# Patient Record
Sex: Female | Born: 1940 | Race: Black or African American | Hispanic: No | Smoking: Never smoker
Health system: Southern US, Community
[De-identification: ages and names within clinical notes are randomized; demographics above are authoritative.]

## PROBLEM LIST (undated history)

## (undated) DIAGNOSIS — E119 Type 2 diabetes mellitus without complications: Secondary | ICD-10-CM

## (undated) DIAGNOSIS — I10 Essential (primary) hypertension: Secondary | ICD-10-CM

## (undated) DIAGNOSIS — E1110 Type 2 diabetes mellitus with ketoacidosis without coma: Secondary | ICD-10-CM

## (undated) DIAGNOSIS — N179 Acute kidney failure, unspecified: Secondary | ICD-10-CM

## (undated) DIAGNOSIS — C259 Malignant neoplasm of pancreas, unspecified: Secondary | ICD-10-CM

## (undated) HISTORY — DX: Essential (primary) hypertension: I10

## (undated) HISTORY — PX: DENTAL SURGERY: SHX609

## (undated) HISTORY — DX: Type 2 diabetes mellitus with ketoacidosis without coma: E11.10

## (undated) HISTORY — DX: Acute kidney failure, unspecified: N17.9

## (undated) HISTORY — PX: TUBAL LIGATION: SHX77

---

## 2010-04-05 ENCOUNTER — Emergency Department (HOSPITAL_COMMUNITY): Admission: EM | Admit: 2010-04-05 | Discharge: 2010-04-05 | Payer: Self-pay | Admitting: Family Medicine

## 2011-02-24 LAB — BASIC METABOLIC PANEL
BUN: 8 mg/dL (ref 6–23)
Calcium: 9.2 mg/dL (ref 8.4–10.5)
GFR calc non Af Amer: >60 mL/min (ref >60)
Glucose, Bld: 109 mg/dL — ABNORMAL HIGH (ref 70–99)
Sodium: 136 mEq/L (ref 135–145)

## 2011-02-24 LAB — CBC
Hemoglobin: 14 g/dL (ref 12.0–15.0)
Platelets: 226 10*3/uL (ref 150–400)
RDW: 13.6 % (ref 11.5–15.5)

## 2013-12-07 ENCOUNTER — Emergency Department (HOSPITAL_COMMUNITY)
Admission: EM | Admit: 2013-12-07 | Discharge: 2013-12-07 | Disposition: A | Discharge status: Other Acute Inpatient Hospital | Payer: Medicare HMO | Source: Home / Self Care | Attending: Emergency Medicine | Admitting: Emergency Medicine

## 2013-12-07 ENCOUNTER — Inpatient Hospital Stay (HOSPITAL_COMMUNITY)
Admission: EM | Admit: 2013-12-07 | Discharge: 2013-12-12 | DRG: 638 | Disposition: A | Discharge status: Home Health | Payer: Medicare HMO | Attending: Internal Medicine | Admitting: Internal Medicine

## 2013-12-07 ENCOUNTER — Encounter (HOSPITAL_COMMUNITY): Payer: Self-pay | Admitting: Emergency Medicine

## 2013-12-07 DIAGNOSIS — E860 Dehydration: Secondary | ICD-10-CM | POA: Diagnosis present

## 2013-12-07 DIAGNOSIS — E870 Hyperosmolality and hypernatremia: Secondary | ICD-10-CM | POA: Diagnosis present

## 2013-12-07 DIAGNOSIS — E876 Hypokalemia: Secondary | ICD-10-CM | POA: Diagnosis not present

## 2013-12-07 DIAGNOSIS — E1010 Type 1 diabetes mellitus with ketoacidosis without coma: Principal | ICD-10-CM | POA: Diagnosis present

## 2013-12-07 DIAGNOSIS — N179 Acute kidney failure, unspecified: Secondary | ICD-10-CM | POA: Diagnosis present

## 2013-12-07 DIAGNOSIS — E1110 Type 2 diabetes mellitus with ketoacidosis without coma: Secondary | ICD-10-CM

## 2013-12-07 DIAGNOSIS — C259 Malignant neoplasm of pancreas, unspecified: Secondary | ICD-10-CM

## 2013-12-07 DIAGNOSIS — D696 Thrombocytopenia, unspecified: Secondary | ICD-10-CM | POA: Diagnosis present

## 2013-12-07 DIAGNOSIS — I10 Essential (primary) hypertension: Secondary | ICD-10-CM | POA: Diagnosis present

## 2013-12-07 DIAGNOSIS — E875 Hyperkalemia: Secondary | ICD-10-CM | POA: Diagnosis present

## 2013-12-07 HISTORY — DX: Type 2 diabetes mellitus without complications: E11.9

## 2013-12-07 HISTORY — DX: Malignant neoplasm of pancreas, unspecified: C25.9

## 2013-12-07 LAB — CBC WITH DIFFERENTIAL/PLATELET
Basophils Absolute: 0 10*3/uL (ref 0.0–0.1)
Basophils Relative: 0 % (ref 0–1)
EOS ABS: 0 10*3/uL (ref 0.0–0.7)
EOS PCT: 0 % (ref 0–5)
HEMATOCRIT: 41.2 % (ref 36.0–46.0)
HEMOGLOBIN: 13.3 g/dL (ref 12.0–15.0)
LYMPHS ABS: 1 10*3/uL (ref 0.7–4.0)
LYMPHS PCT: 23 % (ref 12–46)
MCH: 28.5 pg (ref 26.0–34.0)
MCHC: 32.3 g/dL (ref 30.0–36.0)
MCV: 88.2 fL (ref 78.0–100.0)
MONO ABS: 0.5 10*3/uL (ref 0.1–1.0)
MONOS PCT: 12 % (ref 3–12)
Neutro Abs: 2.8 10*3/uL (ref 1.7–7.7)
Neutrophils Relative %: 65 % (ref 43–77)
PLATELETS: 141 10*3/uL — AB (ref 150–400)
RBC: 4.67 MIL/uL (ref 3.87–5.11)
RDW: 14.7 % (ref 11.5–15.5)
WBC: 4.3 10*3/uL (ref 4.0–10.5)

## 2013-12-07 LAB — COMPREHENSIVE METABOLIC PANEL
ALT: 11 U/L (ref 0–35)
AST: 13 U/L (ref 0–37)
Albumin: 2.9 g/dL — ABNORMAL LOW (ref 3.5–5.2)
Alkaline Phosphatase: 78 U/L (ref 39–117)
BUN: 37 mg/dL — AB (ref 6–23)
CALCIUM: 9.1 mg/dL (ref 8.4–10.5)
CO2: 14 meq/L — AB (ref 19–32)
CREATININE: 1.34 mg/dL — AB (ref 0.50–1.10)
Chloride: 111 mEq/L (ref 96–112)
GFR, EST AFRICAN AMERICAN: 44 mL/min — AB (ref >90)
GFR, EST NON AFRICAN AMERICAN: 38 mL/min — AB (ref >90)
GLUCOSE: 574 mg/dL — AB (ref 70–99)
Potassium: 5.4 mEq/L — ABNORMAL HIGH (ref 3.7–5.3)
SODIUM: 152 meq/L — AB (ref 137–147)
TOTAL PROTEIN: 7.4 g/dL (ref 6.0–8.3)
Total Bilirubin: <0.2 mg/dL — ABNORMAL LOW (ref 0.3–1.2)

## 2013-12-07 LAB — URINALYSIS, ROUTINE W REFLEX MICROSCOPIC
Glucose, UA: >1000 mg/dL — AB
LEUKOCYTES UA: NEGATIVE
NITRITE: NEGATIVE
PH: 5 (ref 5.0–8.0)
Protein, ur: 30 mg/dL — AB
SPECIFIC GRAVITY, URINE: 1.03 (ref 1.005–1.030)
UROBILINOGEN UA: 0.2 mg/dL (ref 0.0–1.0)

## 2013-12-07 LAB — URINE MICROSCOPIC-ADD ON

## 2013-12-07 LAB — GLUCOSE, CAPILLARY
Glucose-Capillary: 540 mg/dL — ABNORMAL HIGH (ref 70–99)
Glucose-Capillary: 531 mg/dL — ABNORMAL HIGH (ref 70–99)
Glucose-Capillary: 430 mg/dL — ABNORMAL HIGH (ref 70–99)

## 2013-12-07 MED ORDER — SODIUM CHLORIDE 0.9 % IV SOLN
INTRAVENOUS | Status: DC
  Administered 2013-12-07: 3.7 [IU]/h via INTRAVENOUS
  Filled 2013-12-07: qty 1

## 2013-12-07 MED ORDER — HYDRALAZINE HCL 10 MG PO TABS
10.0000 mg | ORAL_TABLET | Freq: Three times a day (TID) | ORAL | Status: DC
  Administered 2013-12-08 – 2013-12-11 (×11): 10 mg via ORAL
  Filled 2013-12-07 (×19): qty 1

## 2013-12-07 MED ORDER — SODIUM CHLORIDE 0.9 % IV BOLUS (SEPSIS)
1000.0000 mL | Freq: Once | INTRAVENOUS | Status: AC
  Administered 2013-12-07: 1000 mL via INTRAVENOUS

## 2013-12-07 MED ORDER — SODIUM CHLORIDE 0.9 % IV BOLUS (SEPSIS): 1000.0000 mL | Freq: Once | INTRAVENOUS | Status: DC

## 2013-12-07 MED ORDER — SODIUM CHLORIDE 0.9 % IV SOLN
INTRAVENOUS | Status: DC
  Administered 2013-12-07: 19:00:00 via INTRAVENOUS

## 2013-12-07 MED ORDER — DEXTROSE-NACL 5-0.45 % IV SOLN
INTRAVENOUS | Status: DC
  Administered 2013-12-08: 05:00:00 via INTRAVENOUS

## 2013-12-07 NOTE — ED Provider Notes (Signed)
Medical screening examination/treatment/procedure(s) were conducted as a shared visit with non-physician practitioner(s) and myself.  I personally evaluated the patient during the encounter.  EKG Interpretation    Date/Time:  Thursday December 07 2013 45:40:98 EST Ventricular Rate:  121 PR Interval:  106 QRS Duration: 72 QT Interval:  320 QTC Calculation: 119 R Axis:   -50 Text Interpretation:  Sinus tachycardia LAD, consider left anterior fascicular block Minimal ST depression, lateral leads No significant change since last tracing Confirmed by YAO  MD, DAVID [phone]) on 12/07/2013 7:38:06 PM            Debra [name] is a 73 y.o. female hasn't been followed up with doctors here with hyperglycemia. Fatigue and urinary frequency and polydipsia for a month. Went to urgent care, was found to be hyperglycemic. Sent for eval. CBG was 574. AG was 27. I think she likely has combination of DKA and hyperosmolar. Given 2 L NS and started on glucostabilizer. Will admit to stepdown.   CRITICAL CARE Performed by: Darl Householder, DAVID   Total critical care time:30 min   Critical care time was exclusive of separately billable procedures and treating other patients.  Critical care was necessary to treat or prevent imminent or life-threatening deterioration.  Critical care was time spent personally by me on the following activities: development of treatment plan with patient and/or surrogate as well as nursing, discussions with consultants, evaluation of patient's response to treatment, examination of patient, obtaining history from patient or surrogate, ordering and performing treatments and interventions, ordering and review of laboratory studies, ordering and review of radiographic studies, pulse oximetry and re-evaluation of patient's condition.    Wandra Arthurs, MD 12/07/13 [phone]

## 2013-12-07 NOTE — ED Notes (Signed)
Reportedly not eating,  can't keep down PO when she does eat, memory issues

## 2013-12-07 NOTE — Discharge Instructions (Signed)
We have determined that your problem requires further evaluation in the emergency department.  We will take care of your transport there.  Once at the emergency department, you will be evaluated by a provider and they will order whatever treatment or tests they deem necessary.  We cannot guarantee that they will do any specific test or do any specific treatment.  ° °

## 2013-12-07 NOTE — ED Provider Notes (Signed)
  Chief Complaint   Chief Complaint  Patient presents with  . Fatigue    History of Present Illness   Debra [name] is a 73 year old female who is brought in by her daughter today with a 3 to four-day history of poor appetite for solids and liquids, not eating, not drinking, weight loss, difficulty swallowing, dry mouth, polyuria, and polydipsia. She does not have any prior history of diabetes. It's been years since she seen a doctor. She lives by herself in [place]. Her daughter lives in [place]. She denies any fever, chills, headache, shortness of breath, chest pain, abdominal pain, nausea, vomiting, diarrhea, or urinary symptoms.  Review of Systems     Other than as noted above, the patient denies any of the following symptoms: Systemic:  No fever, chills, sweats, fatigue, myalgias, headache, or anorexia. Eye:  No redness, pain or drainage. ENT:  No earache, nasal congestion, rhinorrhea, sinus pressure, or sore throat. Lungs:  No cough, sputum production, wheezing, shortness of breath.  Cardiovascular:  No chest pain, palpitations, or syncope. GI:  No nausea, vomiting, abdominal pain or diarrhea. GU:  No dysuria, frequency, or hematuria. Skin:  No rash or pruritis.   Loyal     Past medical history, family history, social history, meds, and allergies were reviewed.  She takes no medications and has no known allergies. Her daughter states it's been years since she seen a physician.  Physical Examination    Vital signs:  BP 159/102  Pulse 137  Temp(Src) 97.3 F (36.3 C) (Oral)  Resp 20  SpO2 99% General:  Appears drowsy, and mildly confused. Mucous membranes are dry, and she has a strong odor of acetone on her breath. Eye:  PERRL, full EOMs.  Lids and conjunctivas were normal. ENT:  TMs and canals were normal, without erythema or inflammation.  Nasal mucosa was clear and uncongested, without drainage.  Mucous membranes were dry, and she has white spots on her tongue consistent  with Candida.  Pharynx was clear, without exudate or drainage.  There were no oral ulcerations or lesions. Neck:  Supple, no adenopathy, tenderness or mass. Thyroid was normal. Lungs:  No respiratory distress.  Lungs were clear to auscultation, without wheezes, rales or rhonchi.  Breath sounds were clear and equal bilaterally. Heart:  Regular rhythm, without gallops, murmers or rubs. Abdomen:  Soft, flat, and non-tender to palpation.  No hepatosplenomagaly or mass. Skin:  Clear, warm, and dry, without rash or lesions.  Course in [facility] [place]    A CBG was 540. We have started an IV normal saline at 150 mL per hour and a transferring via [facility].  Assessment   The encounter diagnosis was Diabetic ketoacidosis.  Plan     The patient was transferred to the ED via [facility] in stable condition.  Medical Decision Making     73 year old female is brought in by her daughter today with a 3 to 4 day history of poor appetite, weight loss, confusion, disorientation, and extreme weakness.  She has not seen a physician in many years.  On exam she is ketotic, hypertensive and tachycardic.  Her mucous membranes are dry.  Her CBG was 540. We will start NS, and transfer via [facility].       Harden Mo, MD 12/07/13 [phone]

## 2013-12-07 NOTE — H&P (Signed)
[facility] History and Physical  Debra [name] UDJ:[id number] DOB: 1941-04-04 DOA: 12/07/2013  Referring physician: ED physician PCP: No PCP Per Patient   Chief Complaint: weakness   HPI:  73 year old female with no significant past medical history who presented initially to [facility] today with reports of feeling weak, decreased oral intake, weight loss for past couple of days prior to this admission. She was found to be in DKA and decision was made to transfer her to [facility] for further management. No complaints of nausea, vomiting, abdominal pain. No chest pain, no shortness or breath or palpitations. No fever or chills.  In [facility] she was found to have blood sugar in 500 range and was given IV normal saline. In [facility], her BP was 145/74, HR 113 and T max 97.8 F with oxygen saturation of 97% on room air. Her CBC revealed platelet count of 141. BMP revealed sodium of 152, potassium of 5.4, CO2 of 14 and creatinine of 1.34.Glucose was 574. She was started on regular insulin in ED. Urinalysis was unremarkable, CXR is pending.   Assessment and Plan:  Principal Problem: DKA - unclear what has provoked DKA - urinalysis is negative for an infection and CXR is pending - DKA protocol order in place - BMP every 2 hours and CBG every 1 hour - no potassium supplementation as potassium 5.2 on the admission - continue IV fluids  Active Problems: New onset DM - SW consult and case manager consult  - diabetic educator consult - check A1C - placed on Lantus 10 units for now and SSI once pt able to transition off insulin drip  Hyperkalemia - no need for supplementation, will monitor with Q2 BMP  Hypernatremia - secondary to dehydrations - IVF as noted above and monitor  Acute renal failure - most likely secondary to pre renal etiology and dehydration in the setting of DKA - continue IVF as noted above and repeat BMP Q2 hours and again in AM - urine sodium and urine creatinine ordered  Thrombocytopenia   - unclear etiology - no signs of active bleeding - CBC in AM - use SCD's for DVT prophylaxis HTN - place on Hydralazine for now 10 mg TID   Code Status: Full Family Communication: Pt at bedside Disposition Plan: Admit to SDU    Review of Systems:  Constitutional: Negative for fever, chills and malaise/fatigue. Negative for diaphoresis.  HENT: Negative for hearing loss, ear pain, nosebleeds, congestion, sore throat, neck pain, tinnitus and ear discharge.   Eyes: Negative for blurred vision, double vision, photophobia, pain, discharge and redness.  Respiratory: Negative for cough, hemoptysis, sputum production, shortness of breath, wheezing and stridor.   Cardiovascular: Negative for chest pain, palpitations, orthopnea, claudication and leg swelling.  Gastrointestinal: Negative for nausea, vomiting and abdominal pain. Negative for heartburn, constipation, blood in stool and melena.  Genitourinary: Negative for dysuria, urgency, frequency, hematuria and flank pain.  Musculoskeletal: Negative for myalgias, back pain, joint pain and falls.  Skin: Negative for itching and rash.  Neurological: Negative for dizziness and positive for weakness. Negative for tingling, tremors, sensory change, speech change, focal weakness, loss of consciousness and headaches.  Endo/Heme/Allergies: Negative for environmental allergies and polydipsia. Does not bruise/bleed easily.  Psychiatric/Behavioral: Negative for suicidal ideas. The patient is not nervous/anxious.      History reviewed. No pertinent past medical history.  History reviewed. No pertinent past surgical history.  Social History:  reports that she has never smoked. She does not have any smokeless tobacco  history on file. She reports that she does not drink alcohol. Her drug history is not on file.  No Known Allergies  Family history of hypertension.   Prior to Admission medications   Medication Sig Start Date End Date Taking? Authorizing  Provider  Ascorbic Acid (VITAMIN C PO) Take 1 tablet by mouth daily.   Yes Historical Provider, MD    Physical Exam: Filed Vitals:   12/07/13 1927 12/07/13 2045  BP: 157/91 164/87  Pulse: 123 113  Temp: 97.8 F (36.6 C)   TempSrc: Oral   Resp: 20 20  SpO2: 99% 98%    Physical Exam  Constitutional: Appears ill but no acute distress  HENT: Normocephalic. Dry mucus membranes Eyes: Conjunctivae and EOM are normal. PERRLA, no scleral icterus.  Neck: Normal ROM. Neck supple. No JVD. No tracheal deviation.  CVS: Regular rhythm, tachycardic, S1/S2 appreciated  Pulmonary: Effort and breath sounds normal, no stridor Abdominal: Soft. BS +,  no distension, tenderness, rebound or guarding.  Musculoskeletal: Normal range of motion. No edema and no tenderness.  Lymphadenopathy: No lymphadenopathy noted, cervical, inguinal. Neuro: Alert. No focal neurologic deficits  Skin: Skin is warm and dry.  Psychiatric: Normal mood and affect.  Labs on Admission:  Basic Metabolic Panel:  Recent Labs Lab 12/07/13 2027  NA 152*  K 5.4*  CL 111  CO2 14*  GLUCOSE 574*  BUN 37*  CREATININE 1.34*  CALCIUM 9.1   Liver Function Tests:  Recent Labs Lab 12/07/13 2027  AST 13  ALT 11  ALKPHOS 78  BILITOT <0.2*  PROT 7.4  ALBUMIN 2.9*   CBC:  Recent Labs Lab 12/07/13 2027  WBC 4.3  NEUTROABS 2.8  HGB 13.3  HCT 41.2  MCV 88.2  PLT 141*   CBG:  Recent Labs Lab 12/07/13 1830 12/07/13 2043 12/07/13 2216  GLUCAP 540* 531* 430*    Radiological Exams on Admission: No results found.   Faye Ramsay, MD  [facility] Pager [phone]  If 7PM-7AM, please contact night-coverage [email or web address] Password [facility] 12/07/2013, 10:22 PM

## 2013-12-07 NOTE — ED Notes (Signed)
Patient is transfer from [facility]. Patient has been experiencing lethargy and decreased appetite. CBG 582, BP 180/100 and HR 132

## 2013-12-07 NOTE — ED Provider Notes (Signed)
CSN: [id number]     Arrival date & time 12/07/13  1921 History   First MD Initiated Contact with Patient 12/07/13 1922     Chief Complaint  Patient presents with  . Weakness   (Consider location/radiation/quality/duration/timing/severity/associated sxs/prior Treatment) HPI Comments: Patient transferred today from [facility] due to concern that she may be in DKA.  Patient does not have a history of DM, but was found to have an elevated blood sugar of 540 at Urgent Care.  Her daughter reports that the patient has not seen a physician in 10-15 years.  Her daughter reports that she has noticed that the patient has had recent weight loss, has been less coherent, decreased appetite, and has had polydipsia over the past month.  Patient currently lives by herself.  Patient denies any vision changes, nausea, vomiting, abdominal pain, SOB, cough, fever, chills, or urinary symptoms.    The history is provided by the patient.    History reviewed. No pertinent past medical history. History reviewed. No pertinent past surgical history. No family history on file. History  Substance Use Topics  . Smoking status: Never Smoker   . Smokeless tobacco: Not on file  . Alcohol Use: No   OB History   Grav Para Term Preterm Abortions TAB SAB Ect Mult Living                 Review of Systems  All other systems reviewed and are negative.    Allergies  Review of patient's allergies indicates no known allergies.  Home Medications   Current Outpatient Rx  Name  Route  Sig  Dispense  Refill  . Ascorbic Acid (VITAMIN C PO)   Oral   Take 1 tablet by mouth daily.          BP 157/91  Pulse 123  Temp(Src) 97.8 F (36.6 C) (Oral)  Resp 20  SpO2 99% Physical Exam  Nursing note and vitals reviewed. Constitutional: She appears well-developed and well-nourished.  HENT:  Head: Normocephalic and atraumatic.  Mouth/Throat: Oropharynx is clear and moist.  Neck: Normal range of motion. Neck supple.   Cardiovascular: Normal rate, regular rhythm and normal heart sounds.   Pulmonary/Chest: Effort normal and breath sounds normal. No respiratory distress. She has no wheezes. She has no rales.  Abdominal: Soft. Bowel sounds are normal. She exhibits no distension and no mass. There is no tenderness. There is no rebound and no guarding.  Neurological: She is alert. She has normal strength. No cranial nerve deficit or sensory deficit.  Skin: Skin is warm and dry.  Psychiatric: She has a normal mood and affect.    ED Course  Procedures (including critical care time) Labs Review Labs Reviewed  CBC WITH DIFFERENTIAL - Abnormal; Notable for the following:    Platelets 141 (*)    All other components within normal limits  COMPREHENSIVE METABOLIC PANEL - Abnormal; Notable for the following:    Sodium 152 (*)    Potassium 5.4 (*)    CO2 14 (*)    Glucose, Bld 574 (*)    BUN 37 (*)    Creatinine, Ser 1.34 (*)    Albumin 2.9 (*)    Total Bilirubin <0.2 (*)    GFR calc non Af Amer 38 (*)    GFR calc Af Amer 44 (*)    All other components within normal limits  URINALYSIS, ROUTINE W REFLEX MICROSCOPIC - Abnormal; Notable for the following:    Glucose, UA >1000 (*)  Hgb urine dipstick TRACE (*)    Bilirubin Urine MODERATE (*)    Ketones, ur >80 (*)    Protein, ur 30 (*)    All other components within normal limits  GLUCOSE, CAPILLARY - Abnormal; Notable for the following:    Glucose-Capillary 531 (*)    All other components within normal limits  URINE MICROSCOPIC-ADD ON - Abnormal; Notable for the following:    Squamous Epithelial / LPF FEW (*)    Casts HYALINE CASTS (*)    All other components within normal limits   Imaging Review No results found.  EKG Interpretation    Date/Time:  Thursday December 07 2013 19:28:24 EST Ventricular Rate:  121 PR Interval:  106 QRS Duration: 72 QT Interval:  320 QTC Calculation: 122 R Axis:   -50 Text Interpretation:  Sinus tachycardia LAD,  consider left anterior fascicular block Minimal ST depression, lateral leads No significant change since last tracing Confirmed by YAO  MD, DAVID [phone]) on 12/07/2013 7:38:06 PM          CRITICAL CARE Performed by: Hyman Bible   Total critical care time: 30 minutes  Critical care time was exclusive of separately billable procedures and treating other patients.  Critical care was necessary to treat or prevent imminent or life-threatening deterioration.  Critical care was time spent personally by me on the following activities: development of treatment plan with patient and/or surrogate as well as nursing, discussions with consultants, evaluation of patient's response to treatment, examination of patient, obtaining history from patient or surrogate, ordering and performing treatments and interventions, ordering and review of laboratory studies, ordering and review of radiographic studies, pulse oximetry and re-evaluation of patient's condition.     10:18 PM Discussed with Dr. Doyle Askew with [facility].  She has agreed to admit the patient. MDM  No diagnosis found. Patient with no history of DM presents today with hyperglycemia.  Blood sugar found to be elevated at 574.  Patient given 2 Liters IVF and then started on Glucostabilizer.  Anion gap is 27.  Ketones present in the urine.  Patient admitted to [facility] for further management.      Hyman Bible, PA-C 12/07/13 2221

## 2013-12-08 ENCOUNTER — Encounter (HOSPITAL_COMMUNITY): Payer: Self-pay | Admitting: General Practice

## 2013-12-08 ENCOUNTER — Inpatient Hospital Stay (HOSPITAL_COMMUNITY): Payer: Medicare HMO

## 2013-12-08 DIAGNOSIS — E860 Dehydration: Secondary | ICD-10-CM

## 2013-12-08 DIAGNOSIS — E870 Hyperosmolality and hypernatremia: Secondary | ICD-10-CM

## 2013-12-08 DIAGNOSIS — I10 Essential (primary) hypertension: Secondary | ICD-10-CM | POA: Diagnosis present

## 2013-12-08 LAB — GLUCOSE, CAPILLARY
GLUCOSE-CAPILLARY: 263 mg/dL — AB (ref 70–99)
GLUCOSE-CAPILLARY: 158 mg/dL — AB (ref 70–99)
GLUCOSE-CAPILLARY: 147 mg/dL — AB (ref 70–99)
GLUCOSE-CAPILLARY: 224 mg/dL — AB (ref 70–99)
GLUCOSE-CAPILLARY: 160 mg/dL — AB (ref 70–99)
Glucose-Capillary: 129 mg/dL — ABNORMAL HIGH (ref 70–99)
Glucose-Capillary: 136 mg/dL — ABNORMAL HIGH (ref 70–99)
Glucose-Capillary: 141 mg/dL — ABNORMAL HIGH (ref 70–99)
Glucose-Capillary: 150 mg/dL — ABNORMAL HIGH (ref 70–99)
Glucose-Capillary: 156 mg/dL — ABNORMAL HIGH (ref 70–99)
Glucose-Capillary: 172 mg/dL — ABNORMAL HIGH (ref 70–99)
Glucose-Capillary: 179 mg/dL — ABNORMAL HIGH (ref 70–99)
Glucose-Capillary: 207 mg/dL — ABNORMAL HIGH (ref 70–99)
Glucose-Capillary: 212 mg/dL — ABNORMAL HIGH (ref 70–99)
Glucose-Capillary: 216 mg/dL — ABNORMAL HIGH (ref 70–99)
Glucose-Capillary: 241 mg/dL — ABNORMAL HIGH (ref 70–99)
Glucose-Capillary: 297 mg/dL — ABNORMAL HIGH (ref 70–99)
Glucose-Capillary: 381 mg/dL — ABNORMAL HIGH (ref 70–99)
Glucose-Capillary: 386 mg/dL — ABNORMAL HIGH (ref 70–99)

## 2013-12-08 LAB — BASIC METABOLIC PANEL
BUN: 32 mg/dL — AB (ref 6–23)
BUN: 30 mg/dL — ABNORMAL HIGH (ref 6–23)
BUN: 28 mg/dL — ABNORMAL HIGH (ref 6–23)
BUN: 27 mg/dL — ABNORMAL HIGH (ref 6–23)
BUN: 25 mg/dL — AB (ref 6–23)
BUN: 23 mg/dL (ref 6–23)
BUN: 21 mg/dL (ref 6–23)
BUN: 18 mg/dL (ref 6–23)
CALCIUM: 9.2 mg/dL (ref 8.4–10.5)
CALCIUM: 9 mg/dL (ref 8.4–10.5)
CALCIUM: 8.9 mg/dL (ref 8.4–10.5)
CHLORIDE: 120 meq/L — AB (ref 96–112)
CHLORIDE: 124 meq/L — AB (ref 96–112)
CHLORIDE: 123 meq/L — AB (ref 96–112)
CHLORIDE: 121 meq/L — AB (ref 96–112)
CHLORIDE: 115 meq/L — AB (ref 96–112)
CO2: 18 mEq/L — ABNORMAL LOW (ref 19–32)
CO2: 20 mEq/L (ref 19–32)
CO2: 21 meq/L (ref 19–32)
CO2: 18 meq/L — AB (ref 19–32)
CO2: 17 meq/L — AB (ref 19–32)
CO2: 23 meq/L (ref 19–32)
CO2: 21 mEq/L (ref 19–32)
CO2: 22 mEq/L (ref 19–32)
CREATININE: 0.88 mg/dL (ref 0.50–1.10)
CREATININE: 0.9 mg/dL (ref 0.50–1.10)
Calcium: 9.2 mg/dL (ref 8.4–10.5)
Calcium: 9 mg/dL (ref 8.4–10.5)
Calcium: 9.1 mg/dL (ref 8.4–10.5)
Calcium: 9.3 mg/dL (ref 8.4–10.5)
Calcium: 9.3 mg/dL (ref 8.4–10.5)
Chloride: 122 mEq/L — ABNORMAL HIGH (ref 96–112)
Chloride: 122 mEq/L — ABNORMAL HIGH (ref 96–112)
Chloride: 120 mEq/L — ABNORMAL HIGH (ref 96–112)
Creatinine, Ser: 1 mg/dL (ref 0.50–1.10)
Creatinine, Ser: 0.97 mg/dL (ref 0.50–1.10)
Creatinine, Ser: 0.94 mg/dL (ref 0.50–1.10)
Creatinine, Ser: 0.88 mg/dL (ref 0.50–1.10)
Creatinine, Ser: 0.88 mg/dL (ref 0.50–1.10)
Creatinine, Ser: 0.92 mg/dL (ref 0.50–1.10)
GFR calc Af Amer: 68 mL/min — ABNORMAL LOW (ref >90)
GFR calc Af Amer: 73 mL/min — ABNORMAL LOW (ref >90)
GFR calc Af Amer: 71 mL/min — ABNORMAL LOW (ref >90)
GFR calc non Af Amer: 54 mL/min — ABNORMAL LOW (ref >90)
GFR calc non Af Amer: 56 mL/min — ABNORMAL LOW (ref >90)
GFR calc non Af Amer: 58 mL/min — ABNORMAL LOW (ref >90)
GFR calc non Af Amer: 63 mL/min — ABNORMAL LOW (ref >90)
GFR calc non Af Amer: 63 mL/min — ABNORMAL LOW (ref >90)
GFR calc non Af Amer: 61 mL/min — ABNORMAL LOW (ref >90)
GFR calc non Af Amer: 63 mL/min — ABNORMAL LOW (ref >90)
GFR, EST AFRICAN AMERICAN: 63 mL/min — AB (ref >90)
GFR, EST AFRICAN AMERICAN: 65 mL/min — AB (ref >90)
GFR, EST AFRICAN AMERICAN: 73 mL/min — AB (ref >90)
GFR, EST AFRICAN AMERICAN: 73 mL/min — AB (ref >90)
GFR, EST AFRICAN AMERICAN: 69 mL/min — AB (ref >90)
GFR, EST NON AFRICAN AMERICAN: 60 mL/min — AB (ref >90)
GLUCOSE: 212 mg/dL — AB (ref 70–99)
Glucose, Bld: 352 mg/dL — ABNORMAL HIGH (ref 70–99)
Glucose, Bld: 262 mg/dL — ABNORMAL HIGH (ref 70–99)
Glucose, Bld: 233 mg/dL — ABNORMAL HIGH (ref 70–99)
Glucose, Bld: 169 mg/dL — ABNORMAL HIGH (ref 70–99)
Glucose, Bld: 154 mg/dL — ABNORMAL HIGH (ref 70–99)
Glucose, Bld: 208 mg/dL — ABNORMAL HIGH (ref 70–99)
Glucose, Bld: 189 mg/dL — ABNORMAL HIGH (ref 70–99)
POTASSIUM: 3.7 meq/L (ref 3.7–5.3)
POTASSIUM: 3.5 meq/L — AB (ref 3.7–5.3)
POTASSIUM: 3.7 meq/L (ref 3.7–5.3)
POTASSIUM: 3.7 meq/L (ref 3.7–5.3)
Potassium: 3.6 mEq/L — ABNORMAL LOW (ref 3.7–5.3)
Potassium: 3.5 mEq/L — ABNORMAL LOW (ref 3.7–5.3)
Potassium: 3.6 mEq/L — ABNORMAL LOW (ref 3.7–5.3)
Potassium: 3.2 mEq/L — ABNORMAL LOW (ref 3.7–5.3)
SODIUM: 158 meq/L — AB (ref 137–147)
SODIUM: 155 meq/L — AB (ref 137–147)
Sodium: 156 mEq/L — ABNORMAL HIGH (ref 137–147)
Sodium: 159 mEq/L — ABNORMAL HIGH (ref 137–147)
Sodium: 155 mEq/L — ABNORMAL HIGH (ref 137–147)
Sodium: 157 mEq/L — ABNORMAL HIGH (ref 137–147)
Sodium: 155 mEq/L — ABNORMAL HIGH (ref 137–147)
Sodium: 148 mEq/L — ABNORMAL HIGH (ref 137–147)

## 2013-12-08 LAB — CBC
HCT: 41.7 % (ref 36.0–46.0)
HEMATOCRIT: 42.1 % (ref 36.0–46.0)
Hemoglobin: 14.3 g/dL (ref 12.0–15.0)
Hemoglobin: 14 g/dL (ref 12.0–15.0)
MCH: 29.9 pg (ref 26.0–34.0)
MCH: 29.6 pg (ref 26.0–34.0)
MCHC: 34 g/dL (ref 30.0–36.0)
MCHC: 33.6 g/dL (ref 30.0–36.0)
MCV: 88.1 fL (ref 78.0–100.0)
MCV: 88.2 fL (ref 78.0–100.0)
PLATELETS: 151 10*3/uL (ref 150–400)
Platelets: 147 10*3/uL — ABNORMAL LOW (ref 150–400)
RBC: 4.78 MIL/uL (ref 3.87–5.11)
RBC: 4.73 MIL/uL (ref 3.87–5.11)
RDW: 14.6 % (ref 11.5–15.5)
RDW: 14.7 % (ref 11.5–15.5)
WBC: 4.8 10*3/uL (ref 4.0–10.5)
WBC: 4.7 10*3/uL (ref 4.0–10.5)

## 2013-12-08 LAB — HEMOGLOBIN A1C
Hgb A1c MFr Bld: 14.5 % — ABNORMAL HIGH (ref <5.7)
Mean Plasma Glucose: 369 mg/dL — ABNORMAL HIGH (ref <117)

## 2013-12-08 LAB — LIPID PANEL
CHOL/HDL RATIO: 5.3 ratio
Cholesterol: 306 mg/dL — ABNORMAL HIGH (ref 0–200)
HDL: 58 mg/dL (ref >39)
LDL CALC: 201 mg/dL — AB (ref 0–99)
Triglycerides: 233 mg/dL — ABNORMAL HIGH (ref <150)
VLDL: 47 mg/dL — AB (ref 0–40)

## 2013-12-08 LAB — TSH: TSH: 0.618 u[IU]/mL (ref 0.350–4.500)

## 2013-12-08 MED ORDER — SODIUM CHLORIDE 0.9 % IV BOLUS (SEPSIS)
250.0000 mL | Freq: Once | INTRAVENOUS | Status: AC
  Administered 2013-12-08: 250 mL via INTRAVENOUS

## 2013-12-08 MED ORDER — SODIUM CHLORIDE 0.9 % IV SOLN
INTRAVENOUS | Status: DC
  Administered 2013-12-08: 01:00:00 via INTRAVENOUS

## 2013-12-08 MED ORDER — DEXTROSE 5 % IV SOLN
INTRAVENOUS | Status: DC
  Administered 2013-12-08 (×2): via INTRAVENOUS

## 2013-12-08 MED ORDER — DEXTROSE 5 % IV SOLN: INTRAVENOUS | Status: DC

## 2013-12-08 MED ORDER — AMLODIPINE BESYLATE 10 MG PO TABS
10.0000 mg | ORAL_TABLET | Freq: Every day | ORAL | Status: DC
  Administered 2013-12-08 – 2013-12-12 (×5): 10 mg via ORAL
  Filled 2013-12-08 (×6): qty 1

## 2013-12-08 MED ORDER — POTASSIUM CHLORIDE CRYS ER 20 MEQ PO TBCR
40.0000 meq | EXTENDED_RELEASE_TABLET | Freq: Once | ORAL | Status: AC
  Administered 2013-12-08: 40 meq via ORAL
  Filled 2013-12-08: qty 2

## 2013-12-08 MED ORDER — LIVING WELL WITH DIABETES BOOK
Freq: Once | Status: DC
  Filled 2013-12-08: qty 1

## 2013-12-08 MED ORDER — HEPARIN SODIUM (PORCINE) 5000 UNIT/ML IJ SOLN
5000.0000 [IU] | Freq: Three times a day (TID) | INTRAMUSCULAR | Status: DC
  Administered 2013-12-08 – 2013-12-11 (×11): 5000 [IU] via SUBCUTANEOUS
  Filled 2013-12-08 (×13): qty 1

## 2013-12-08 MED ORDER — DEXTROSE 50 % IV SOLN: 25.0000 mL | INTRAVENOUS | Status: DC | PRN

## 2013-12-08 MED ORDER — INSULIN ASPART 100 UNIT/ML ~~LOC~~ SOLN: 0.0000 [IU] | Freq: Three times a day (TID) | SUBCUTANEOUS | Status: DC

## 2013-12-08 MED ORDER — SODIUM CHLORIDE 0.9 % IV SOLN
INTRAVENOUS | Status: DC
  Administered 2013-12-09 (×3): via INTRAVENOUS

## 2013-12-08 MED ORDER — BD GETTING STARTED TAKE HOME KIT: 3/10ML X 30G SYRINGES
1.0000 | Freq: Once | Status: AC
  Administered 2013-12-08: 1
  Filled 2013-12-08: qty 1

## 2013-12-08 MED ORDER — BD GETTING STARTED TAKE HOME KIT: 1/2ML X 30G SYRINGES
1.0000 | Freq: Once | Status: DC
  Filled 2013-12-08: qty 1

## 2013-12-08 MED ORDER — AMLODIPINE BESYLATE 5 MG PO TABS
5.0000 mg | ORAL_TABLET | Freq: Every day | ORAL | Status: DC
  Filled 2013-12-08: qty 1

## 2013-12-08 MED ORDER — INSULIN ASPART 100 UNIT/ML ~~LOC~~ SOLN
0.0000 [IU] | Freq: Three times a day (TID) | SUBCUTANEOUS | Status: DC
  Administered 2013-12-09: 5 [IU] via SUBCUTANEOUS
  Administered 2013-12-09: 8 [IU] via SUBCUTANEOUS

## 2013-12-08 MED ORDER — INSULIN ASPART 100 UNIT/ML ~~LOC~~ SOLN: 0.0000 [IU] | Freq: Every day | SUBCUTANEOUS | Status: DC

## 2013-12-08 MED ORDER — DEXTROSE 5 % IV SOLN: Freq: Once | INTRAVENOUS | Status: DC

## 2013-12-08 MED ORDER — INSULIN GLARGINE 100 UNIT/ML ~~LOC~~ SOLN
10.0000 [IU] | Freq: Every day | SUBCUTANEOUS | Status: DC
  Administered 2013-12-08: 10 [IU] via SUBCUTANEOUS
  Filled 2013-12-08 (×2): qty 0.1

## 2013-12-08 MED ORDER — INSULIN REGULAR HUMAN 100 UNIT/ML IJ SOLN
INTRAMUSCULAR | Status: DC
  Administered 2013-12-08: 8.1 [IU]/h via INTRAVENOUS
  Administered 2013-12-08: 12.7 [IU]/h via INTRAVENOUS
  Filled 2013-12-08: qty 1

## 2013-12-08 NOTE — ED Notes (Signed)
Pt CBG was 386. RN notified of results

## 2013-12-08 NOTE — Progress Notes (Signed)
[facility] PROGRESS NOTE  Debra [name] PIR:[id number] DOB: Jul 02, 1941 DOA: 12/07/2013 PCP: No PCP Per Patient  Assessment/Plan: #1 DKA Questionable etiology. Patient with no respiratory symptoms.patient denies any chest pain. Urinalysis is negative. Anion gap still at 14.bicarbonate is improved. Continue insulin drip. Once anion gap is closed will transition to subcutaneous Lantus 10 units daily. Sliding scale insulin. IV fluids. Follow. Place on clear liquids and advance diet as tolerated to a carb modified diet.  #2 hypernatremia Likely secondary to dehydration. Place patient on D5W. Follow.  #3 dehydration IV fluids.  #4 hypertension Start Norvasc at 10 mg daily. Continue low-dose hydralazine.  #5 prophylaxis Heparin for DVT prophylaxis.  Code Status: full Family Communication: Updated patient and daughter at bedside. Disposition Plan: home when medically stable.   Consultants:  none  Procedures:  Chest x-ray 1/ 2/ 2015  Antibiotics:  none  HPI/Subjective: Patient sleeping no complaints.  Objective: Filed Vitals:   12/08/13 1436  BP: 161/87  Pulse: 104  Temp: 98.3 F (36.8 C)  Resp: 18    Intake/Output Summary (Last 24 hours) at 12/08/13 1814 Last data filed at 12/08/13 1500  Gross per 24 hour  Intake 1569.18 ml  Output    251 ml  Net 1318.18 ml   Filed Weights   12/08/13 0637  Weight: 76.5 kg (168 lb 10.4 oz)    Exam:   General:  NAD  Cardiovascular: RRR  Respiratory: CTAB  Abdomen: SOFT, NONTENDER, NONDISTENDED, POSITIVE BOWEL SOUNDS.  Musculoskeletal: no clubbing cyanosis or edema  Data Reviewed: Basic Metabolic Panel:  Recent Labs Lab 12/08/13 0740 12/08/13 0830 12/08/13 1020 12/08/13 1224 12/08/13 1706  NA 159* 155* 155* 157* 155*  K 3.5* 3.5* 3.7 3.6* 3.7  CL 124* 123* 122* 121* 120*  CO2 21 18* 17* 23 21  GLUCOSE 233* 212* 169* 154* 208*  BUN 28* 27* 25* 23 21  CREATININE 0.94 0.88 0.88 0.90 0.88  CALCIUM 9.0  9.1 9.3 9.3 9.0   Liver Function Tests:  Recent Labs Lab 12/07/13 2027  AST 13  ALT 11  ALKPHOS 78  BILITOT <0.2*  PROT 7.4  ALBUMIN 2.9*   No results found for this basename: LIPASE, AMYLASE,  in the last 168 hours No results found for this basename: AMMONIA,  in the last 168 hours CBC:  Recent Labs Lab 12/07/13 2027 12/08/13 0240 12/08/13 0540  WBC 4.3 4.8 4.7  NEUTROABS 2.8  --   --   HGB 13.3 14.3 14.0  HCT 41.2 42.1 41.7  MCV 88.2 88.1 88.2  PLT 141* 147* 151   Cardiac Enzymes: No results found for this basename: CKTOTAL, CKMB, CKMBINDEX, TROPONINI,  in the last 168 hours BNP (last 3 results) No results found for this basename: PROBNP,  in the last 8760 hours CBG:  Recent Labs Lab 12/08/13 1201 12/08/13 1319 12/08/13 1438 12/08/13 1546 12/08/13 1657  GLUCAP 158* 129* 147* 136* 212*    No results found for this or any previous visit (from the past 240 hour(s)).   Studies: Dg Chest 2 View  12/08/2013   CLINICAL DATA:  New onset diabetes and cough.  EXAM: CHEST  2 VIEW  COMPARISON:  None.  FINDINGS: Shallow inspiration. Normal heart size and pulmonary vascularity. Suggestion of infiltration or atelectasis in the left lung base, possibly due to shallow inspiration. Dense nodule in the right lung base probably represents a calcified granuloma. No pneumothorax. No blunting of costophrenic angles.  IMPRESSION: Infiltration or atelectasis in the left lung base.  Calcified granuloma on the right.   Electronically Signed   By: Lucienne Capers M.D.   On: 12/08/2013 04:46    Scheduled Meds: . heparin  5,000 Units Subcutaneous Q8H  . hydrALAZINE  10 mg Oral Q8H  . insulin aspart  0-20 Units Subcutaneous TID WC  . insulin glargine  10 Units Subcutaneous QHS  . living well with diabetes book   Does not apply Once   Continuous Infusions: . dextrose 125 mL/hr at 12/08/13 1435  . insulin (NOVOLIN-R) infusion 9.1 Units/hr (12/08/13 1658)    Principal Problem:   DKA  (diabetic ketoacidoses) Active Problems:   Hypernatremia   Dehydration   HTN (hypertension)    Time spent: 35 mins    Scarlettrose Costilow M.D. [facility] Pager [phone]. If 7PM-7AM, please contact night-coverage at [email or web address], password [facility] 12/08/2013, 6:14 PM  LOS: 1 day

## 2013-12-08 NOTE — Evaluation (Signed)
Physical Therapy Evaluation Patient Details Name: Debra [name] MRN: [id number] DOB: 14-Dec-1940 Today's Date: 12/08/2013 Time: [id number] PT Time Calculation (min): 15 min  PT Assessment / Plan / Recommendation History of Present Illness  73 year old female with no significant past medical history who presented initially to [facility] today with reports of feeling weak, decreased oral intake, weight loss for past couple of days prior to this admission. She was found to be in DKA.  Patient with new dx of DM.  Clinical Impression  Patient presents with problems listed below.  Will benefit from acute PT to maximize independence prior to discharge.  Patient needs 24 hour assist for safety/cognition.  Per family report, sister is coming from out of town to stay with patient.  If unable to have 24 hour assist, may need SNF.    PT Assessment  Patient needs continued PT services    Follow Up Recommendations  Home health PT;Supervision/Assistance - 24 hour    Does the patient have the potential to tolerate intense rehabilitation      Barriers to Discharge Decreased caregiver support Patient lives alone.  Relative from out-of-town to stay with patient at discharge.    Equipment Recommendations  Rolling walker with 5" wheels    Recommendations for Other Services     Frequency Min 3X/week    Precautions / Restrictions Precautions Precautions: Fall Restrictions Weight Bearing Restrictions: No   Pertinent Vitals/Pain       Mobility  Bed Mobility Bed Mobility: Supine to Sit;Sitting - Scoot to Edge of Bed;Sit to Supine Supine to Sit: 4: Min assist;With rails;HOB elevated Sitting - Scoot to Edge of Bed: 4: Min assist Sit to Supine: 4: Min assist;With rail;HOB elevated Details for Bed Mobility Assistance: Verbal cues for technique. Assist to move LE's off of bed and onto bed to return to supine.  Once upright, patient able to maintain balance with supervision. Transfers Transfers: Sit to  Stand;Stand to Sit Sit to Stand: 4: Min assist;With upper extremity assist;From bed Stand to Sit: 4: Min assist;With upper extremity assist;To bed Details for Transfer Assistance: Verbal cues for hand placement.  Assist for balance and safety. Ambulation/Gait Ambulation/Gait Assistance: 4: Min assist Ambulation Distance (Feet): 24 Feet Assistive device: None Ambulation/Gait Assistance Details: Patient with unsteady gait, staggering to both sides, requiring assist to prevent fall. Gait Pattern: Step-through pattern;Decreased step length - right;Decreased step length - left;Ataxic;Trunk flexed Gait velocity: Slow gait speed General Gait Details: Patient reports her balance is good.  Unaware of decreased balance and fall risk.    Exercises     PT Diagnosis: Difficulty walking;Abnormality of gait;Generalized weakness;Altered mental status  PT Problem List: Decreased strength;Decreased activity tolerance;Decreased balance;Decreased mobility;Decreased cognition;Decreased knowledge of use of DME;Decreased safety awareness PT Treatment Interventions: DME instruction;Gait training;Functional mobility training;Balance training;Cognitive remediation;Patient/family education     PT Goals(Current goals can be found in the care plan section) Acute Rehab PT Goals Patient Stated Goal: Unable to state PT Goal Formulation: With patient Time For Goal Achievement: 12/15/13 Potential to Achieve Goals: Good  Visit Information  Last PT Received On: 12/08/13 Assistance Needed: +1 History of Present Illness: 73 year old female with no significant past medical history who presented initially to [facility] today with reports of feeling weak, decreased oral intake, weight loss for past couple of days prior to this admission. She was found to be in DKA.  Patient with new dx of DM.       Prior Functioning  Home Living Family/patient expects to be  discharged to:: Private residence Living Arrangements:  Alone Available Help at Discharge: Family;Available 24 hours/day (Sister from [place] coming to stay with patient) Type of Home: House Home Access: Level entry Home Layout: Two level;Able to live on main level with bedroom/bathroom Alternate Level Stairs-Number of Steps: flight Alternate Level Stairs-Rails: Right Home Equipment: None Prior Function Level of Independence: Independent Communication Communication: No difficulties    Cognition  Cognition Arousal/Alertness: Lethargic Behavior During Therapy: Flat affect Overall Cognitive Status: Impaired/Different from baseline Area of Impairment: Orientation;Attention;Memory;Following commands;Safety/judgement;Problem solving Orientation Level: Disoriented to;Time;Situation Current Attention Level: Sustained Memory: Decreased short-term memory Following Commands: Follows one step commands inconsistently;Follows one step commands with increased time Safety/Judgement: Decreased awareness of safety;Decreased awareness of deficits Problem Solving: Slow processing;Decreased initiation;Difficulty sequencing;Requires verbal cues    Extremity/Trunk Assessment Upper Extremity Assessment Upper Extremity Assessment: Generalized weakness Lower Extremity Assessment Lower Extremity Assessment: Generalized weakness   Balance Balance Balance Assessed: Yes Static Standing Balance Static Standing - Balance Support: Right upper extremity supported Static Standing - Level of Assistance: 4: Min assist Static Standing - Comment/# of Minutes: 2 min with loss of balance to both sides.  End of Session PT - End of Session Equipment Utilized During Treatment: Gait belt Activity Tolerance: Patient limited by lethargy;Patient limited by fatigue Patient left: in bed;with call bell/phone within reach;with family/visitor present Nurse Communication: Mobility status  GP     Despina Pole 12/08/2013, 2:32 PM Carita Pian. Sanjuana Kava, [place] Pager [phone]

## 2013-12-08 NOTE — Progress Notes (Signed)
Utilization Review Completed.Debra [name] T1/01/2014  

## 2013-12-08 NOTE — Progress Notes (Addendum)
Inpatient Diabetes Program Recommendations  AACE/ADA: New Consensus Statement on Inpatient Glycemic Control (2013)  Target Ranges:  Prepandial:   less than 140 mg/dL      Peak postprandial:   less than 180 mg/dL (1-2 hours)      Critically ill patients:  140 - 180 mg/dL   Reason for Assessment:  New-onset DM  73 year old female who is brought in by her daughter today with a 3 to four-day history of poor appetite for solids and liquids, not eating, not drinking, weight loss, difficulty swallowing, dry mouth, polyuria, and polydipsia. She does not have any prior history of diabetes. It's been years since she seen a doctor. She lives by herself in [place]. CBG was 540 on admission and GlucoStabilizer was ordered per DKA protocol.  Results for Debra, [name] (MRN [id number]) as of 12/08/2013 17:23  Ref. Range 12/08/2013 02:40  Hemoglobin A1C Latest Range: <5.7 % 14.5 (H)  Results for Debra, [name] (MRN [id number]) as of 12/08/2013 17:23  Ref. Range 12/08/2013 10:20 12/08/2013 12:24  Sodium Latest Range: 137-147 mEq/L 155 (H) 157 (H)  Potassium Latest Range: 3.7-5.3 mEq/L 3.7 3.6 (L)  Chloride Latest Range: 96-112 mEq/L 122 (H) 121 (H)  CO2 Latest Range: 19-32 mEq/L 17 (L) 23  BUN Latest Range: 6-23 mg/dL 25 (H) 23  Creatinine Latest Range: 0.50-1.10 mg/dL 0.88 0.90  Calcium Latest Range: 8.4-10.5 mg/dL 9.3 9.3  GFR calc non Af Amer Latest Range: >90 mL/min 63 (L) 61 (L)  GFR calc Af Amer Latest Range: >90 mL/min 73 (L) 71 (L)  Glucose Latest Range: 70-99 mg/dL 169 (H) 154 (H)  Results for Debra, [name] (MRN [id number]) as of 12/08/2013 17:23  Ref. Range 12/08/2013 05:37 12/08/2013 06:59 12/08/2013 08:21 12/08/2013 09:36 12/08/2013 10:56 12/08/2013 12:01 12/08/2013 13:19 12/08/2013 14:38 12/08/2013 15:46 12/08/2013 16:57  Glucose-Capillary Latest Range: 70-99 mg/dL 216 (H) 241 (H) 179 (H) 156 (H) 150 (H) 158 (H) 129 (H) 147 (H) 136 (H) 212 (H)   AG is 13. Consider transitioning to basal-bolus insulin.    Inpatient  Diabetes Program Recommendations Insulin - Basal: Transition to Lantus 10 units prior to discontinuation of insulin drip. Correction (SSI): Novolog moderate tidwc and hs HgbA1C: 14.5% - uncontrolled Diet: When advanced, CHO mod med  Note: Will order insulin starter kit and RN to teach insulin administration when pt is back at baseline. Will need prescription for glucose meter and supplies at discharge. Will need PCP to manage diabetes at discharge. Discussion with RN regarding pt and family viewingdiabetes videos on pt ed channel. Will order OP Diabetes Education consult for new onset DM.  Thank you. Lorenda Peck, RD, LDN, CDE Inpatient Diabetes Coordinator [phone]

## 2013-12-09 LAB — BASIC METABOLIC PANEL
BUN: 17 mg/dL (ref 6–23)
BUN: 16 mg/dL (ref 6–23)
BUN: 15 mg/dL (ref 6–23)
BUN: 13 mg/dL (ref 6–23)
CALCIUM: 8.6 mg/dL (ref 8.4–10.5)
CALCIUM: 8.7 mg/dL (ref 8.4–10.5)
CHLORIDE: 110 meq/L (ref 96–112)
CHLORIDE: 111 meq/L (ref 96–112)
CO2: 19 mEq/L (ref 19–32)
CO2: 16 mEq/L — ABNORMAL LOW (ref 19–32)
CO2: 19 mEq/L (ref 19–32)
CO2: 19 meq/L (ref 19–32)
CREATININE: 0.95 mg/dL (ref 0.50–1.10)
CREATININE: 0.96 mg/dL (ref 0.50–1.10)
Calcium: 8.2 mg/dL — ABNORMAL LOW (ref 8.4–10.5)
Calcium: 8.5 mg/dL (ref 8.4–10.5)
Chloride: 113 mEq/L — ABNORMAL HIGH (ref 96–112)
Chloride: 114 mEq/L — ABNORMAL HIGH (ref 96–112)
Creatinine, Ser: 0.87 mg/dL (ref 0.50–1.10)
Creatinine, Ser: 0.79 mg/dL (ref 0.50–1.10)
GFR calc Af Amer: >90 mL/min (ref >90)
GFR calc non Af Amer: 58 mL/min — ABNORMAL LOW (ref >90)
GFR calc non Af Amer: 80 mL/min — ABNORMAL LOW (ref >90)
GFR, EST AFRICAN AMERICAN: 67 mL/min — AB (ref >90)
GFR, EST AFRICAN AMERICAN: 74 mL/min — AB (ref >90)
GFR, EST AFRICAN AMERICAN: 66 mL/min — AB (ref >90)
GFR, EST NON AFRICAN AMERICAN: 64 mL/min — AB (ref >90)
GFR, EST NON AFRICAN AMERICAN: 57 mL/min — AB (ref >90)
GLUCOSE: 178 mg/dL — AB (ref 70–99)
Glucose, Bld: 262 mg/dL — ABNORMAL HIGH (ref 70–99)
Glucose, Bld: 313 mg/dL — ABNORMAL HIGH (ref 70–99)
Glucose, Bld: 289 mg/dL — ABNORMAL HIGH (ref 70–99)
POTASSIUM: 3.3 meq/L — AB (ref 3.7–5.3)
POTASSIUM: 3.5 meq/L — AB (ref 3.7–5.3)
Potassium: 3.4 mEq/L — ABNORMAL LOW (ref 3.7–5.3)
Potassium: 3.7 mEq/L (ref 3.7–5.3)
Sodium: 147 mEq/L (ref 137–147)
Sodium: 147 mEq/L (ref 137–147)
Sodium: 144 mEq/L (ref 137–147)
Sodium: 142 mEq/L (ref 137–147)

## 2013-12-09 LAB — GLUCOSE, CAPILLARY
GLUCOSE-CAPILLARY: 267 mg/dL — AB (ref 70–99)
GLUCOSE-CAPILLARY: 190 mg/dL — AB (ref 70–99)
Glucose-Capillary: 128 mg/dL — ABNORMAL HIGH (ref 70–99)
Glucose-Capillary: 131 mg/dL — ABNORMAL HIGH (ref 70–99)
Glucose-Capillary: 240 mg/dL — ABNORMAL HIGH (ref 70–99)
Glucose-Capillary: 255 mg/dL — ABNORMAL HIGH (ref 70–99)

## 2013-12-09 MED ORDER — SODIUM CHLORIDE 0.9 % IV BOLUS (SEPSIS)
1000.0000 mL | Freq: Once | INTRAVENOUS | Status: AC
Start: 2013-12-09 — End: 2013-12-09
  Administered 2013-12-09: 1000 mL via INTRAVENOUS

## 2013-12-09 MED ORDER — POTASSIUM CHLORIDE CRYS ER 20 MEQ PO TBCR
40.0000 meq | EXTENDED_RELEASE_TABLET | Freq: Once | ORAL | Status: AC
  Administered 2013-12-09: 40 meq via ORAL
  Filled 2013-12-09: qty 2

## 2013-12-09 MED ORDER — INSULIN GLARGINE 100 UNIT/ML ~~LOC~~ SOLN
14.0000 [IU] | Freq: Every day | SUBCUTANEOUS | Status: DC
  Administered 2013-12-09: 14 [IU] via SUBCUTANEOUS
  Filled 2013-12-09 (×2): qty 0.14

## 2013-12-09 MED ORDER — POTASSIUM CHLORIDE CRYS ER 20 MEQ PO TBCR: 40.0000 meq | EXTENDED_RELEASE_TABLET | Freq: Once | ORAL | Status: DC

## 2013-12-09 MED ORDER — INSULIN ASPART 100 UNIT/ML ~~LOC~~ SOLN
0.0000 [IU] | Freq: Three times a day (TID) | SUBCUTANEOUS | Status: DC
  Administered 2013-12-09: 4 [IU] via SUBCUTANEOUS
  Administered 2013-12-10 (×3): 7 [IU] via SUBCUTANEOUS
  Administered 2013-12-11: 4 [IU] via SUBCUTANEOUS
  Administered 2013-12-11: 7 [IU] via SUBCUTANEOUS
  Administered 2013-12-11: 11 [IU] via SUBCUTANEOUS
  Administered 2013-12-12: 4 [IU] via SUBCUTANEOUS
  Administered 2013-12-12: 7 [IU] via SUBCUTANEOUS

## 2013-12-09 NOTE — Plan of Care (Signed)
Problem: Food- and Nutrition-Related Knowledge Deficit (NB-1.1) Goal: Nutrition education Formal process to instruct or train a patient/client in a skill or to impart knowledge to help patients/clients voluntarily manage or modify food choices and eating behavior to maintain or improve health. Outcome: Completed/Met Date Met:  12/09/13  RD consulted for nutrition education regarding diabetes. Pt lives alone and had not been to the doctor in years. Pt with new dx of DM. Daughters at bedside with multiple questions all answered. Per pt she has not had any recent weight changes and has a good appetite.     Lab Results  Component Value Date    HGBA1C 14.5* 12/08/2013    RD provided "Carbohydrate Counting for People with Diabetes" handout from the Academy of Nutrition and Dietetics. Discussed different food groups and their effects on blood sugar, emphasizing carbohydrate-containing foods. Provided list of carbohydrates and recommended serving sizes of common foods.  Discussed importance of controlled and consistent carbohydrate intake throughout the day. Provided examples of ways to balance meals/snacks and encouraged intake of high-fiber, whole grain complex carbohydrates. Teach back method used.  Expect fair to good compliance.  Body mass index is 28.07 kg/(m^2). Pt meets criteria for overweight based on current BMI.  Current diet order is CHO modified, patient is consuming approximately 100% of meals at this time. Labs and medications reviewed. No further nutrition interventions warranted at this time. RD contact information provided. If additional nutrition issues arise, please re-consult RD.  [place], [place], [place] Pager [phone] After Hours Pager

## 2013-12-09 NOTE — Progress Notes (Signed)
[facility] PROGRESS NOTE  Debra [name] ZSW:[id number] DOB: 1941-11-11 DOA: 12/07/2013 PCP: No PCP Per Patient  Assessment/Plan: #1 DKA/Newly diagnosed DM Questionable etiology. Patient with no respiratory symptoms.patient denies any chest pain. Urinalysis is negative. Anion gap is closed. Patient has been transitioned to basal insulin. HgbA1C is 14.5.  Increase Lantus to 14units. Change SSI to resistant scale. IV fluids. Follow. Tolerating carb modified diet.  #2 hypernatremia Likely secondary to dehydration. Improved. D/C D5W. Follow.  #3 dehydration IV fluids.  #4 hypertension Continue Norvasc at 10 mg daily. Continue low-dose hydralazine.  #5 prophylaxis Heparin for DVT prophylaxis.  Code Status: full Family Communication: Updated patient and daughter at bedside. Disposition Plan: home when medically stable.   Consultants:  none  Procedures:  Chest x-ray 1/ 2/ 2015  Antibiotics:  none  HPI/Subjective: Patient sleeping no complaints.  Objective: Filed Vitals:   12/09/13 0529  BP: 150/85  Pulse: 110  Temp: 98.3 F (36.8 C)  Resp: 17    Intake/Output Summary (Last 24 hours) at 12/09/13 1313 Last data filed at 12/09/13 0800  Gross per 24 hour  Intake    120 ml  Output    451 ml  Net   -331 ml   Filed Weights   12/08/13 0637  Weight: 76.5 kg (168 lb 10.4 oz)    Exam:   General:  NAD  Cardiovascular: RRR  Respiratory: CTAB  Abdomen: SOFT, NONTENDER, NONDISTENDED, POSITIVE BOWEL SOUNDS.  Musculoskeletal: no clubbing cyanosis or edema  Data Reviewed: Basic Metabolic Panel:  Recent Labs Lab 12/08/13 1706 12/08/13 2000 12/09/13 0200 12/09/13 0540 12/09/13 0905  NA 155* 148* 147 147 144  K 3.7 3.2* 3.3* 3.4* 3.5*  CL 120* 115* 113* 114* 110  CO2 21 22 19  16* 19  GLUCOSE 208* 189* 178* 262* 313*  BUN 21 18 17 16 15   CREATININE 0.88 0.92 0.95 0.87 0.96  CALCIUM 9.0 8.9 8.6 8.7 8.2*   Liver Function Tests:  Recent Labs Lab  12/07/13 2027  AST 13  ALT 11  ALKPHOS 78  BILITOT <0.2*  PROT 7.4  ALBUMIN 2.9*   No results found for this basename: LIPASE, AMYLASE,  in the last 168 hours No results found for this basename: AMMONIA,  in the last 168 hours CBC:  Recent Labs Lab 12/07/13 2027 12/08/13 0240 12/08/13 0540  WBC 4.3 4.8 4.7  NEUTROABS 2.8  --   --   HGB 13.3 14.3 14.0  HCT 41.2 42.1 41.7  MCV 88.2 88.1 88.2  PLT 141* 147* 151   Cardiac Enzymes: No results found for this basename: CKTOTAL, CKMB, CKMBINDEX, TROPONINI,  in the last 168 hours BNP (last 3 results) No results found for this basename: PROBNP,  in the last 8760 hours CBG:  Recent Labs Lab 12/08/13 2151 12/08/13 2325 12/09/13 0113 12/09/13 0628 12/09/13 1119  GLUCAP 141* 128* 131* 240* 267*    No results found for this or any previous visit (from the past 240 hour(s)).   Studies: Dg Chest 2 View  12/08/2013   CLINICAL DATA:  New onset diabetes and cough.  EXAM: CHEST  2 VIEW  COMPARISON:  None.  FINDINGS: Shallow inspiration. Normal heart size and pulmonary vascularity. Suggestion of infiltration or atelectasis in the left lung base, possibly due to shallow inspiration. Dense nodule in the right lung base probably represents a calcified granuloma. No pneumothorax. No blunting of costophrenic angles.  IMPRESSION: Infiltration or atelectasis in the left lung base. Calcified granuloma on the right.  Electronically Signed   By: Lucienne Capers M.D.   On: 12/08/2013 04:46    Scheduled Meds: . amLODipine  10 mg Oral Daily  . heparin  5,000 Units Subcutaneous Q8H  . hydrALAZINE  10 mg Oral Q8H  . insulin aspart  0-15 Units Subcutaneous TID WC  . insulin aspart  0-5 Units Subcutaneous QHS  . insulin glargine  10 Units Subcutaneous QHS  . living well with diabetes book   Does not apply Once   Continuous Infusions: . sodium chloride 100 mL/hr at 12/09/13 1056    Principal Problem:   DKA (diabetic ketoacidoses) Active  Problems:   Hypernatremia   Dehydration   HTN (hypertension)    Time spent: 4 mins    THOMPSON,DANIEL M.D. [facility] Pager [phone]. If 7PM-7AM, please contact night-coverage at [email or web address], password [facility] 12/09/2013, 1:13 PM  LOS: 2 days

## 2013-12-10 LAB — BASIC METABOLIC PANEL
BUN: 9 mg/dL (ref 6–23)
CHLORIDE: 109 meq/L (ref 96–112)
CO2: 19 mEq/L (ref 19–32)
CREATININE: 0.76 mg/dL (ref 0.50–1.10)
Calcium: 8.2 mg/dL — ABNORMAL LOW (ref 8.4–10.5)
GFR calc non Af Amer: 81 mL/min — ABNORMAL LOW (ref >90)
Glucose, Bld: 288 mg/dL — ABNORMAL HIGH (ref 70–99)
Potassium: 3.3 mEq/L — ABNORMAL LOW (ref 3.7–5.3)
Sodium: 143 mEq/L (ref 137–147)

## 2013-12-10 LAB — GLUCOSE, CAPILLARY
GLUCOSE-CAPILLARY: 240 mg/dL — AB (ref 70–99)
Glucose-Capillary: 223 mg/dL — ABNORMAL HIGH (ref 70–99)
Glucose-Capillary: 265 mg/dL — ABNORMAL HIGH (ref 70–99)
Glucose-Capillary: 256 mg/dL — ABNORMAL HIGH (ref 70–99)

## 2013-12-10 MED ORDER — INSULIN GLARGINE 100 UNIT/ML ~~LOC~~ SOLN
18.0000 [IU] | Freq: Every day | SUBCUTANEOUS | Status: DC
  Administered 2013-12-10: 18 [IU] via SUBCUTANEOUS
  Filled 2013-12-10 (×2): qty 0.18

## 2013-12-10 MED ORDER — POTASSIUM CHLORIDE CRYS ER 20 MEQ PO TBCR
40.0000 meq | EXTENDED_RELEASE_TABLET | Freq: Once | ORAL | Status: AC
  Administered 2013-12-10: 40 meq via ORAL
  Filled 2013-12-10: qty 2

## 2013-12-10 NOTE — Progress Notes (Signed)
   CARE MANAGEMENT NOTE 12/10/2013  Patient:  Debra [name], Debra [name]   Account Number:  [id number]  Date Initiated:  12/09/2013  Documentation initiated by:  [facility]  Subjective/Objective Assessment:   adm:    Fatigue     Action/Plan:   discharge planning   Anticipated DC Date:  12/11/2013   Anticipated DC Plan:  [place]  CM consult      Choice offered to / List presented to:             Status of service:  In process, will continue to follow Medicare Important Message given?   (If response is "NO", the following Medicare IM given date fields will be blank) Date Medicare IM given:   Date Additional Medicare IM given:    Discharge Disposition:    Per UR Regulation:    If discussed at Long Length of Stay Meetings, dates discussed:    Comments:  12/09/13 08:30 CM spoke with pt's daughter concerning lack of PCP for mother.  CM gave pt handout for List of Resources to attain a PCP.  CM also suggested looking on back of insurance card and calling the insurance company as they sometimes have preferred PCPs.  Daughter states she understands.  Will continue to follow for discharge needs. Mariane Masters, BSN, CM [phone].

## 2013-12-10 NOTE — Progress Notes (Signed)
[facility] PROGRESS NOTE  Temia Debroux WGN:[id number] DOB: 15-Nov-1941 DOA: 12/07/2013 PCP: No PCP Per Patient  Assessment/Plan: #1 DKA/Newly diagnosed DM Questionable etiology. Patient with no respiratory symptoms.patient denies any chest pain. Urinalysis is negative. Anion gap is closed. Patient has been transitioned to basal insulin. HgbA1C is 14.5.  Increase Lantus to 18units. Change SSI to resistant scale. NSL IV fluids. Follow. Tolerating carb modified diet.  #2 hypernatremia Likely secondary to dehydration. Improved. D/C D5W. Follow.  #3 dehydration NSL IVF  #4 hypertension Continue Norvasc at 10 mg daily. Continue low-dose hydralazine.  #5 prophylaxis Heparin for DVT prophylaxis.  Code Status: full Family Communication: Updated patient and daughter at bedside. Disposition Plan: home when medically stable.   Consultants:  none  Procedures:  Chest x-ray 1/ 2/ 2015  Antibiotics:  none  HPI/Subjective: Patient sleeping no complaints.  Objective: Filed Vitals:   12/10/13 0416  BP: 139/72  Pulse: 92  Temp: 98.6 F (37 C)  Resp: 19    Intake/Output Summary (Last 24 hours) at 12/10/13 1319 Last data filed at 12/10/13 0700  Gross per 24 hour  Intake    120 ml  Output    400 ml  Net   -280 ml   Filed Weights   12/08/13 0637  Weight: 76.5 kg (168 lb 10.4 oz)    Exam:   General:  NAD  Cardiovascular: RRR  Respiratory: CTAB  Abdomen: SOFT, NONTENDER, NONDISTENDED, POSITIVE BOWEL SOUNDS.  Musculoskeletal: no clubbing cyanosis or edema  Data Reviewed: Basic Metabolic Panel:  Recent Labs Lab 12/09/13 0200 12/09/13 0540 12/09/13 0905 12/09/13 1425 12/10/13 0520  NA 147 147 144 142 143  K 3.3* 3.4* 3.5* 3.7 3.3*  CL 113* 114* 110 111 109  CO2 19 16* 19 19 19   GLUCOSE 178* 262* 313* 289* 288*  BUN 17 16 15 13 9   CREATININE 0.95 0.87 0.96 0.79 0.76  CALCIUM 8.6 8.7 8.2* 8.5 8.2*   Liver Function Tests:  Recent Labs Lab  12/07/13 2027  AST 13  ALT 11  ALKPHOS 78  BILITOT <0.2*  PROT 7.4  ALBUMIN 2.9*   No results found for this basename: LIPASE, AMYLASE,  in the last 168 hours No results found for this basename: AMMONIA,  in the last 168 hours CBC:  Recent Labs Lab 12/07/13 2027 12/08/13 0240 12/08/13 0540  WBC 4.3 4.8 4.7  NEUTROABS 2.8  --   --   HGB 13.3 14.3 14.0  HCT 41.2 42.1 41.7  MCV 88.2 88.1 88.2  PLT 141* 147* 151   Cardiac Enzymes: No results found for this basename: CKTOTAL, CKMB, CKMBINDEX, TROPONINI,  in the last 168 hours BNP (last 3 results) No results found for this basename: PROBNP,  in the last 8760 hours CBG:  Recent Labs Lab 12/09/13 1119 12/09/13 1618 12/09/13 2111 12/10/13 0612 12/10/13 1136  GLUCAP 267* 190* 255* 223* 240*    No results found for this or any previous visit (from the past 240 hour(s)).   Studies: No results found.  Scheduled Meds: . amLODipine  10 mg Oral Daily  . heparin  5,000 Units Subcutaneous Q8H  . hydrALAZINE  10 mg Oral Q8H  . insulin aspart  0-20 Units Subcutaneous TID WC  . insulin glargine  14 Units Subcutaneous QHS  . living well with diabetes book   Does not apply Once   Continuous Infusions: . sodium chloride 100 mL/hr at 12/09/13 2352    Principal Problem:   DKA (diabetic ketoacidoses) Active Problems:  Hypernatremia   Dehydration   HTN (hypertension)    Time spent: 35 mins    Jalen Daluz M.D. [facility] Pager [phone]. If 7PM-7AM, please contact night-coverage at [email or web address], password [facility] 12/10/2013, 1:19 PM  LOS: 3 days

## 2013-12-11 DIAGNOSIS — E876 Hypokalemia: Secondary | ICD-10-CM | POA: Diagnosis not present

## 2013-12-11 LAB — BASIC METABOLIC PANEL
BUN: 7 mg/dL (ref 6–23)
BUN: 7 mg/dL (ref 6–23)
CALCIUM: 8.1 mg/dL — AB (ref 8.4–10.5)
CALCIUM: 8.5 mg/dL (ref 8.4–10.5)
CO2: 24 mEq/L (ref 19–32)
CO2: 23 mEq/L (ref 19–32)
Chloride: 107 mEq/L (ref 96–112)
Chloride: 103 mEq/L (ref 96–112)
Creatinine, Ser: 0.71 mg/dL (ref 0.50–1.10)
Creatinine, Ser: 0.73 mg/dL (ref 0.50–1.10)
GFR calc Af Amer: >90 mL/min (ref >90)
GFR calc Af Amer: >90 mL/min (ref >90)
GFR, EST NON AFRICAN AMERICAN: 83 mL/min — AB (ref >90)
GFR, EST NON AFRICAN AMERICAN: 82 mL/min — AB (ref >90)
Glucose, Bld: 204 mg/dL — ABNORMAL HIGH (ref 70–99)
Glucose, Bld: 249 mg/dL — ABNORMAL HIGH (ref 70–99)
Potassium: 2.9 mEq/L — CL (ref 3.7–5.3)
Potassium: 3.2 mEq/L — ABNORMAL LOW (ref 3.7–5.3)
SODIUM: 143 meq/L (ref 137–147)
SODIUM: 139 meq/L (ref 137–147)

## 2013-12-11 LAB — GLUCOSE, CAPILLARY
GLUCOSE-CAPILLARY: 210 mg/dL — AB (ref 70–99)
GLUCOSE-CAPILLARY: 173 mg/dL — AB (ref 70–99)
Glucose-Capillary: 198 mg/dL — ABNORMAL HIGH (ref 70–99)
Glucose-Capillary: 283 mg/dL — ABNORMAL HIGH (ref 70–99)

## 2013-12-11 LAB — MAGNESIUM: MAGNESIUM: 1.6 mg/dL (ref 1.5–2.5)

## 2013-12-11 MED ORDER — MAGNESIUM SULFATE 40 MG/ML IJ SOLN
2.0000 g | Freq: Once | INTRAMUSCULAR | Status: AC
  Administered 2013-12-11: 2 g via INTRAVENOUS
  Filled 2013-12-11: qty 50

## 2013-12-11 MED ORDER — LISINOPRIL 10 MG PO TABS
10.0000 mg | ORAL_TABLET | Freq: Every day | ORAL | Status: DC
  Administered 2013-12-11 – 2013-12-12 (×2): 10 mg via ORAL
  Filled 2013-12-11 (×3): qty 1

## 2013-12-11 MED ORDER — INSULIN GLARGINE 100 UNIT/ML ~~LOC~~ SOLN
20.0000 [IU] | Freq: Every day | SUBCUTANEOUS | Status: DC
  Administered 2013-12-11: 20 [IU] via SUBCUTANEOUS
  Filled 2013-12-11 (×2): qty 0.2

## 2013-12-11 MED ORDER — POTASSIUM CHLORIDE CRYS ER 20 MEQ PO TBCR
40.0000 meq | EXTENDED_RELEASE_TABLET | ORAL | Status: AC
  Administered 2013-12-11 (×3): 40 meq via ORAL
  Filled 2013-12-11 (×2): qty 2

## 2013-12-11 NOTE — Care Management Note (Signed)
    Page 1 of 2   12/12/2013     11:39:15 AM   CARE MANAGEMENT NOTE 12/12/2013  Patient:  Debra [name], Debra [name]   Account Number:  [id number]  Date Initiated:  12/09/2013  Documentation initiated by:  [facility]  Subjective/Objective Assessment:   adm:    Fatigue     Action/Plan:   discharge planning   Anticipated DC Date:  12/12/2013   Anticipated DC Plan:  [place]  CM consult      [facility] Choice  HOME HEALTH   Choice offered to / List presented to:  C-4 Adult Children        [place] arranged  HH-1 RN  [place].   Status of service:  Completed, signed off Medicare Important Message given?   (If response is "NO", the following Medicare IM given date fields will be blank) Date Medicare IM given:   Date Additional Medicare IM given:    Discharge Disposition:  [place]  Per UR Regulation:  Reviewed for med. necessity/level of care/duration of stay  If discussed at [place] of Stay Meetings, dates discussed:    Comments:  12/12/12 Debra Deignan,RN,BSN [phone] PT Debra [name].  REFERRAL TO [facility] FOR HH FOLLOW UP, PER PT/DTR CHOICE.  START OF CARE 24-48H POST DC DATE. DTR STATES SHE HAS MADE PCP FOLLOW UP FOR NEXT WEEK WITH A DR CHO IN [place]. PT TO DC TO DAUGHTER'S HOME AT DC: ADDRESS IS:  Debra [name]                        Debra [name], [place] [address]                       [phone] OR [phone]  12/11/13 Debra Didonato,RN,BSN [phone] PT WILL NEED HH FOLLOW UP AT DC; DAUGHTER GIVEN [place] [facility] LIST OF HH PROVIDERS FOR REVIEW.  DAUGHTER STATES SHE PLANS TO MAKE PCP APPT FOR PT WITHIN THE NEXT WEEK--SHE HAS 3 POTENTIAL PCP'S IN MIND AS POSSIBILITIES.  DAUGHTER DESIRES THAT CASE MGR COME BACK IN AM FOR HH CARE CHOICE. WILL FOLLOW UP IN AM, AS REQUESTED.  12/09/13 08:30 CM spoke with pt's daughter concerning lack of PCP for mother.  CM gave pt handout for List of  Resources to attain a PCP.  CM also suggested looking on back of insurance card and calling the insurance company as they sometimes have preferred PCPs.  Daughter states she understands.  Will continue to follow for discharge needs. Debra [name], BSN, CM [phone].

## 2013-12-11 NOTE — Progress Notes (Signed)
Physical Therapy Treatment Patient Details Name: Loxley Cibrian MRN: [id number] DOB: Feb 06, 1941 Today's Date: 12/11/2013 Time: 4097-3532 PT Time Calculation (min): 26 min  PT Assessment / Plan / Recommendation  History of Present Illness 73 year old female with no significant past medical history who presented initially to [facility] today with reports of feeling weak, decreased oral intake, weight loss for past couple of days prior to this admission. She was found to be in DKA.  Patient with new dx of DM.   PT Comments   Pt progressing very well with mobility when compared to last PT note.  She tolerated increased ambulation, exercises and stair negotiation with SaO2 remaining in upper 90's throughout on RA with HR to 116 at highest.  Continue to recommend HHPT, however feel that pt will not need a RW at D/C.  RN notified.   Follow Up Recommendations  Home health PT;Supervision/Assistance - 24 hour     Does the patient have the potential to tolerate intense rehabilitation     Barriers to Discharge        Equipment Recommendations  None recommended by PT    Recommendations for Other Services    Frequency Min 3X/week   Progress towards PT Goals Progress towards PT goals: Progressing toward goals  Plan Current plan remains appropriate    Precautions / Restrictions Precautions Precautions: Fall Restrictions Weight Bearing Restrictions: No   Pertinent Vitals/Pain No pain    Mobility  Bed Mobility Bed Mobility: Supine to Sit Supine to Sit: 5: Supervision Details for Bed Mobility Assistance: Supervision for safety when sitting to EOB.  Transfers Transfers: Sit to Stand;Stand to Sit Sit to Stand: 5: Supervision;From bed Stand to Sit: 5: Supervision;To chair/3-in-1 Details for Transfer Assistance: Supervision for safety with min cues for hand placement and safety when sitting/standing.  Ambulation/Gait Ambulation/Gait Assistance: 5: Supervision;4: Min guard Ambulation Distance (Feet):  250 Feet Assistive device: None Ambulation/Gait Assistance Details: Pt with supervision to min/guard assist in order to steady with turning or when turning head during ambulation.  Gait Pattern: Step-through pattern;Decreased step length - right;Decreased step length - left;Ataxic;Trunk flexed Stairs: Yes Stairs Assistance: 5: Supervision;4: Min guard Stairs Assistance Details (indicate cue type and reason): min cues for safety and technique.  Stair Management Technique: One rail Right;Alternating pattern;Forwards Number of Stairs: 4    Exercises General Exercises - Lower Extremity Heel Slides: Strengthening;Both;10 reps;Standing (standing knee flex) Hip ABduction/ADduction: Strengthening;Both;10 reps;Standing Toe Raises: Strengthening;Both;10 reps;Standing Heel Raises: Strengthening;Both;10 reps;Standing Mini-Sqauts: Strengthening;10 reps;Standing   PT Diagnosis:    PT Problem List:   PT Treatment Interventions:     PT Goals (current goals can now be found in the care plan section) Acute Rehab PT Goals PT Goal Formulation: With patient Time For Goal Achievement: 12/15/13 Potential to Achieve Goals: Good  Visit Information  Last PT Received On: 12/11/13 Assistance Needed: +1 History of Present Illness: 73 year old female with no significant past medical history who presented initially to [facility] today with reports of feeling weak, decreased oral intake, weight loss for past couple of days prior to this admission. She was found to be in DKA.  Patient with new dx of DM.    Subjective Data      Cognition  Cognition Arousal/Alertness: Awake/alert Behavior During Therapy: WFL for tasks assessed/performed Overall Cognitive Status: Within Functional Limits for tasks assessed    Balance     End of Session PT - End of Session Equipment Utilized During Treatment: Gait belt Activity Tolerance: Patient tolerated  treatment well Patient left: in chair;with call bell/phone within  reach;with family/visitor present Nurse Communication: Mobility status   GP     Denice Bors 12/11/2013, 2:52 PM

## 2013-12-11 NOTE — Progress Notes (Signed)
Inpatient Diabetes Program Recommendations  AACE/ADA: New Consensus Statement on Inpatient Glycemic Control (2013)  Target Ranges:  Prepandial:   less than 140 mg/dL      Peak postprandial:   less than 180 mg/dL (1-2 hours)      Critically ill patients:  140 - 180 mg/dL   Reason for Visit: Follow-up with patient regarding New Onset Diabetes.  Patient has not self-administered insulin yet however plans to tonight.  She has starter kit and "Living Well with Diabetes" Booklet at bedside.  Encouraged patient to watch diabetes videos. Asked RN to assist patient with this.  Patient has not chosen PCP yet, but plans to by tomorrow.  Daughters at bedside and seemed very supportive.  Note plans for Home Health.  Will need to follow-up with PCP in the next 7-10 days.  Left meter Rx. In shadow chart for MD to sign.  Thanks, Adah Perl, RN, BC-ADM Inpatient Diabetes Coordinator Pager [phone]

## 2013-12-11 NOTE — Progress Notes (Signed)
Demonstrated and instructed pt on insulin administration with mealtime insulin coverage. Pt agreed to perform the bedtime insulin injection herself later this evening.

## 2013-12-11 NOTE — Progress Notes (Signed)
[facility] PROGRESS NOTE  Debra [name] ATF:[id number] DOB: 09-07-1941 DOA: 12/07/2013 PCP: No PCP Per Patient  Assessment/Plan: #1 DKA/Newly diagnosed DM Questionable etiology. Patient with no respiratory symptoms.patient denies any chest pain. Urinalysis is negative. Anion gap is closed. Patient has been transitioned to basal insulin. HgbA1C is 14.5.  Increase Lantus to 20 units. Continue SSI. Follow. Tolerating carb modified diet.  #2 hypernatremia Likely secondary to dehydration. Improved. Follow.  #3 dehydration Resolved.  #4 hypokalemia Replete.  #5 hypertension Continue Norvasc at 10 mg daily. Change hydralazine to lisinopril and titrate.   #6 prophylaxis Heparin for DVT prophylaxis.  Code Status: full Family Communication: Updated patient and daughter at bedside. Disposition Plan: home when medically stable.   Consultants:  none  Procedures:  Chest x-ray 1/ 2/ 2015  Antibiotics:  none  HPI/Subjective: Patient sleeping no complaints.  Objective: Filed Vitals:   12/11/13 1500  BP: 126/72  Pulse: 110  Temp: 98.7 F (37.1 C)  Resp: 16    Intake/Output Summary (Last 24 hours) at 12/11/13 1858 Last data filed at 12/11/13 1500  Gross per 24 hour  Intake    240 ml  Output    700 ml  Net   -460 ml   Filed Weights   12/08/13 0637  Weight: 76.5 kg (168 lb 10.4 oz)    Exam:   General:  NAD  Cardiovascular: RRR  Respiratory: CTAB  Abdomen: SOFT, NONTENDER, NONDISTENDED, POSITIVE BOWEL SOUNDS.  Musculoskeletal: no clubbing cyanosis or edema  Data Reviewed: Basic Metabolic Panel:  Recent Labs Lab 12/09/13 0540 12/09/13 0905 12/09/13 1425 12/10/13 0520 12/11/13 0550  NA 147 144 142 143 143  K 3.4* 3.5* 3.7 3.3* 2.9*  CL 114* 110 111 109 107  CO2 16* 19 19 19 24   GLUCOSE 262* 313* 289* 288* 204*  BUN 16 15 13 9 7   CREATININE 0.87 0.96 0.79 0.76 0.71  CALCIUM 8.7 8.2* 8.5 8.2* 8.1*  MG  --   --   --   --  1.6   Liver  Function Tests:  Recent Labs Lab 12/07/13 2027  AST 13  ALT 11  ALKPHOS 78  BILITOT <0.2*  PROT 7.4  ALBUMIN 2.9*   No results found for this basename: LIPASE, AMYLASE,  in the last 168 hours No results found for this basename: AMMONIA,  in the last 168 hours CBC:  Recent Labs Lab 12/07/13 2027 12/08/13 0240 12/08/13 0540  WBC 4.3 4.8 4.7  NEUTROABS 2.8  --   --   HGB 13.3 14.3 14.0  HCT 41.2 42.1 41.7  MCV 88.2 88.1 88.2  PLT 141* 147* 151   Cardiac Enzymes: No results found for this basename: CKTOTAL, CKMB, CKMBINDEX, TROPONINI,  in the last 168 hours BNP (last 3 results) No results found for this basename: PROBNP,  in the last 8760 hours CBG:  Recent Labs Lab 12/10/13 1634 12/10/13 2041 12/11/13 0559 12/11/13 1106 12/11/13 1619  GLUCAP 265* 256* 198* 210* 283*    No results found for this or any previous visit (from the past 240 hour(s)).   Studies: No results found.  Scheduled Meds: . amLODipine  10 mg Oral Daily  . insulin aspart  0-20 Units Subcutaneous TID WC  . insulin glargine  20 Units Subcutaneous QHS  . lisinopril  10 mg Oral Daily  . living well with diabetes book   Does not apply Once   Continuous Infusions:    Principal Problem:   DKA (diabetic ketoacidoses) Active Problems:  Hypernatremia   Dehydration   HTN (hypertension)   Hypokalemia    Time spent: 35 mins    Ameah Chanda M.D. [facility] Pager [phone]. If 7PM-7AM, please contact night-coverage at [email or web address], password [facility] 12/11/2013, 6:58 PM  LOS: 4 days

## 2013-12-12 LAB — BASIC METABOLIC PANEL
BUN: 6 mg/dL (ref 6–23)
CHLORIDE: 103 meq/L (ref 96–112)
CO2: 26 meq/L (ref 19–32)
Calcium: 8.2 mg/dL — ABNORMAL LOW (ref 8.4–10.5)
Creatinine, Ser: 0.75 mg/dL (ref 0.50–1.10)
GFR calc Af Amer: >90 mL/min (ref >90)
GFR calc non Af Amer: 81 mL/min — ABNORMAL LOW (ref >90)
Glucose, Bld: 201 mg/dL — ABNORMAL HIGH (ref 70–99)
Potassium: 3.6 mEq/L — ABNORMAL LOW (ref 3.7–5.3)
Sodium: 140 mEq/L (ref 137–147)

## 2013-12-12 LAB — GLUCOSE, CAPILLARY
GLUCOSE-CAPILLARY: 182 mg/dL — AB (ref 70–99)
GLUCOSE-CAPILLARY: 215 mg/dL — AB (ref 70–99)

## 2013-12-12 MED ORDER — AMLODIPINE BESYLATE 10 MG PO TABS: 10.0000 mg | ORAL_TABLET | Freq: Every day | ORAL | Status: DC

## 2013-12-12 MED ORDER — LISINOPRIL 10 MG PO TABS: 10.0000 mg | ORAL_TABLET | Freq: Every day | ORAL | Status: DC

## 2013-12-12 MED ORDER — "INSULIN SYRINGE-NEEDLE U-100 31G X 3/8"" 0.3 ML MISC": 20.0000 [IU] | Freq: Every day | Status: DC

## 2013-12-12 MED ORDER — POTASSIUM CHLORIDE CRYS ER 20 MEQ PO TBCR
40.0000 meq | EXTENDED_RELEASE_TABLET | Freq: Once | ORAL | Status: AC
  Administered 2013-12-12: 40 meq via ORAL
  Filled 2013-12-12: qty 2

## 2013-12-12 MED ORDER — INSULIN GLARGINE 100 UNIT/ML ~~LOC~~ SOLN: 20.0000 [IU] | Freq: Every day | SUBCUTANEOUS | Status: DC

## 2013-12-12 NOTE — Discharge Summary (Signed)
Physician Discharge Summary  Debra [name] WRU:[id number] DOB: 23-Apr-1941 DOA: 12/07/2013  PCP: Rodena Medin, MD  Admit date: 12/07/2013 Discharge date: 12/12/2013  Time spent: 65 minutes  Recommendations for Outpatient Follow-up:  1. Patient is to followup with Rodena Medin, MD on 12/22/2012. Will followup patient will need a basic metabolic profile done to followup on electrolytes and renal function. Patient will need further outpatient diabetes education. Patient will need a blood pressure reassessed.  Discharge Diagnoses:  Principal Problem:   DKA (diabetic ketoacidoses) Active Problems:   Hypernatremia   Dehydration   HTN (hypertension)   Hypokalemia   Discharge Condition: Stable and improved  Diet recommendation: Carb modified  Filed Weights   12/08/13 0637  Weight: 76.5 kg (168 lb 10.4 oz)    History of present illness:  73 year old female with no significant past medical history who presented initially to [facility] today with reports of feeling weak, decreased oral intake, weight loss for past couple of days prior to this admission. She was found to be in DKA and decision was made to transfer her to [facility] for further management. No complaints of nausea, vomiting, abdominal pain. No chest pain, no shortness or breath or palpitations. No fever or chills.  In [facility] she was found to have blood sugar in 500 range and was given IV normal saline. In [facility], her BP was 145/74, HR 113 and T max 97.8 F with oxygen saturation of 97% on room air. Her CBC revealed platelet count of 141. BMP revealed sodium of 152, potassium of 5.4, CO2 of 14 and creatinine of 1.34.Glucose was 574. She was started on regular insulin in ED. Urinalysis was unremarkable, CXR is pending.    Hospital Course:  #1 DKA/new-onset diabetes mellitus Patient was admitted in DKA with no prior history of diabetes and felt to be new-onset diabetes. Patient was placed on the glucose stabilizer, IV fluids and followed. Patient improved  clinically patient's anion gap closed and patient was subsequently transitioned from the glucose stabilizer to subcutaneous insulin. Patient was started on Lantus 10 units daily however secondary to elevated blood sugars her Lantus was increased to 20 units with better control. Patient was placed on a sliding scale insulin as well. Patient was given diabetes education. Patient will followup with PCP one week post discharge. On followup patient's diabetes will need to be reassessed at that time patient may benefit from outpatient diabetes education. Patient be discharged home on Lantus and will followup with PCP as outpatient.  #2 hypernatremia  During the hospitalization patient was noted to be hyponatremic which was felt to be likely secondary to dehydration. Patient was initially placed on D5W with resolution of her hypernatremia.  #3 dehydration  IV fluids. Resolved #4 hypertension  Patient was noted to have elevated blood pressures during the hospitalization. Patient was initially started on low-dose hydralazine and Norvasc added to the regimen. Hydralazine was subsequently discontinued in favor of lisinopril. Patient will be discharged on Norvasc as well as lisinopril and need to followup with PCP as outpatient. #5 hypokalemia Patient was noted to be hypokalemic during the hospitalization. Patient's potassium was repleted and the patient's hypokalemia had resolved by day of discharge.     Procedures: Chest x-ray 1/ 2/ 2015   Consultations:  None  Discharge Exam: Filed Vitals:   12/12/13 0326  BP: 125/74  Pulse: 102  Temp: 98.6 F (37 C)  Resp: 17    General: NAD Cardiovascular: RRR Respiratory: CTAB  Discharge Instructions  Discharge Orders   Future Orders Complete By Expires   Ambulatory referral to Nutrition and Diabetic Education  As directed    Diet Carb Modified  As directed    Discharge instructions  As directed    Comments:     Follow up with Dr Lajuan Lines on  12/22/13   Increase activity slowly  As directed        Medication List         amLODipine 10 MG tablet  Commonly known as:  NORVASC  Take 1 tablet (10 mg total) by mouth daily.     insulin glargine 100 UNIT/ML injection  Commonly known as:  LANTUS  Inject 0.2 mLs (20 Units total) into the skin at bedtime.     Insulin Syringe-Needle U-100 31G X 3/8" 0.3 ML Misc  Inject 20 Units into the skin at bedtime.     lisinopril 10 MG tablet  Commonly known as:  PRINIVIL,ZESTRIL  Take 1 tablet (10 mg total) by mouth daily.     VITAMIN C PO  Take 1 tablet by mouth daily.       Allergies  Allergen Reactions  . Amoxicillin Rash   Follow-up Information   Follow up with Rodena Medin, MD On 12/22/2013. (f/u at 130pm)    Specialty:  Internal Medicine   Contact information:   [phone] [address] [place] [phone]        The results of significant diagnostics from this hospitalization (including imaging, microbiology, ancillary and laboratory) are listed below for reference.    Significant Diagnostic Studies: Dg Chest 2 View  12/08/2013   CLINICAL DATA:  New onset diabetes and cough.  EXAM: CHEST  2 VIEW  COMPARISON:  None.  FINDINGS: Shallow inspiration. Normal heart size and pulmonary vascularity. Suggestion of infiltration or atelectasis in the left lung base, possibly due to shallow inspiration. Dense nodule in the right lung base probably represents a calcified granuloma. No pneumothorax. No blunting of costophrenic angles.  IMPRESSION: Infiltration or atelectasis in the left lung base. Calcified granuloma on the right.   Electronically Signed   By: Lucienne Capers M.D.   On: 12/08/2013 04:46    Microbiology: No results found for this or any previous visit (from the past 240 hour(s)).   Labs: Basic Metabolic Panel:  Recent Labs Lab 12/09/13 1425 12/10/13 0520 12/11/13 0550 12/11/13 1930 12/12/13 0316  NA 142 143 143 139 140  K 3.7 3.3* 2.9* 3.2*  3.6*  CL 111 109 107 103 103  CO2 19 19 24 23 26   GLUCOSE 289* 288* 204* 249* 201*  BUN 13 9 7 7 6   CREATININE 0.79 0.76 0.71 0.73 0.75  CALCIUM 8.5 8.2* 8.1* 8.5 8.2*  MG  --   --  1.6  --   --    Liver Function Tests:  Recent Labs Lab 12/07/13 2027  AST 13  ALT 11  ALKPHOS 78  BILITOT <0.2*  PROT 7.4  ALBUMIN 2.9*   No results found for this basename: LIPASE, AMYLASE,  in the last 168 hours No results found for this basename: AMMONIA,  in the last 168 hours CBC:  Recent Labs Lab 12/07/13 2027 12/08/13 0240 12/08/13 0540  WBC 4.3 4.8 4.7  NEUTROABS 2.8  --   --   HGB 13.3 14.3 14.0  HCT 41.2 42.1 41.7  MCV 88.2 88.1 88.2  PLT 141* 147* 151   Cardiac Enzymes: No results found for this basename: CKTOTAL, CKMB, CKMBINDEX, TROPONINI,  in the last 168 hours BNP: BNP (last 3 results) No results found for this basename: PROBNP,  in the last 8760 hours CBG:  Recent Labs Lab 12/11/13 1106 12/11/13 1619 12/11/13 2136 12/12/13 0625 12/12/13 1120  GLUCAP 210* 283* 173* 182* 215*       Signed:  THOMPSON,DANIEL M.D. [facility] 12/12/2013, 3:01 PM

## 2013-12-12 NOTE — Progress Notes (Signed)
Pt. Got d/c instructions and follow up appointments and prescriptions.IV was d/c.tele was d/c.Pt. Ready to go home with family.

## 2013-12-12 NOTE — Progress Notes (Signed)
Inpatient Diabetes Program Recommendations  AACE/ADA: New Consensus Statement on Inpatient Glycemic Control (2013)  Target Ranges:  Prepandial:   less than 140 mg/dL      Peak postprandial:   less than 180 mg/dL (1-2 hours)      Critically ill patients:  140 - 180 mg/dL   Results for Debra [name], Debra [name] (MRN [id number]) as of 12/12/2013 13:24  Ref. Range 12/11/2013 05:59 12/11/2013 11:06 12/11/2013 16:19 12/11/2013 21:36 12/12/2013 06:25 12/12/2013 11:20  Glucose-Capillary Latest Range: 70-99 mg/dL 198 (H) 210 (H) 283 (H) 173 (H) 182 (H) 215 (H)   Consider adding Novolog meal coverage for elevated postprandials. Thank you  Raoul Pitch BSN, RN,CDE Inpatient Diabetes Coordinator [phone] (team pager)

## 2014-07-18 ENCOUNTER — Encounter: Payer: Self-pay | Admitting: *Deleted

## 2014-07-19 ENCOUNTER — Ambulatory Visit: Payer: Medicare Other | Admitting: Internal Medicine

## 2014-09-05 ENCOUNTER — Other Ambulatory Visit: Payer: Self-pay | Admitting: Gastroenterology

## 2014-09-05 DIAGNOSIS — R6881 Early satiety: Secondary | ICD-10-CM

## 2014-09-05 DIAGNOSIS — R634 Abnormal weight loss: Secondary | ICD-10-CM

## 2014-09-05 DIAGNOSIS — R109 Unspecified abdominal pain: Secondary | ICD-10-CM

## 2014-09-10 ENCOUNTER — Encounter (HOSPITAL_COMMUNITY)
Admission: RE | Admit: 2014-09-10 | Discharge: 2014-09-10 | Disposition: A | Discharge status: Home / Self Care | Payer: Medicare HMO | Source: Ambulatory Visit | Attending: Gastroenterology | Admitting: Gastroenterology

## 2014-09-10 DIAGNOSIS — R6881 Early satiety: Secondary | ICD-10-CM

## 2014-09-10 DIAGNOSIS — R109 Unspecified abdominal pain: Secondary | ICD-10-CM | POA: Diagnosis present

## 2014-09-10 DIAGNOSIS — R634 Abnormal weight loss: Secondary | ICD-10-CM | POA: Diagnosis present

## 2014-09-10 MED ORDER — TECHNETIUM TC 99M SULFUR COLLOID
2.0000 | Freq: Once | INTRAVENOUS | Status: AC | PRN
  Administered 2014-09-10: 2 via ORAL

## 2014-09-14 ENCOUNTER — Other Ambulatory Visit: Payer: Self-pay | Admitting: Gastroenterology

## 2014-09-14 DIAGNOSIS — R1084 Generalized abdominal pain: Secondary | ICD-10-CM

## 2014-09-14 DIAGNOSIS — R634 Abnormal weight loss: Secondary | ICD-10-CM

## 2014-09-24 ENCOUNTER — Ambulatory Visit
Admission: RE | Admit: 2014-09-24 | Discharge: 2014-09-24 | Disposition: A | Discharge status: Home / Self Care | Payer: Medicare HMO | Source: Ambulatory Visit | Attending: Gastroenterology | Admitting: Gastroenterology

## 2014-09-24 DIAGNOSIS — R634 Abnormal weight loss: Secondary | ICD-10-CM

## 2014-09-24 DIAGNOSIS — R1084 Generalized abdominal pain: Secondary | ICD-10-CM

## 2014-09-24 MED ORDER — IOHEXOL 300 MG/ML  SOLN
100.0000 mL | Freq: Once | INTRAMUSCULAR | Status: AC | PRN
  Administered 2014-09-24: 100 mL via INTRAVENOUS

## 2014-09-26 ENCOUNTER — Inpatient Hospital Stay (HOSPITAL_COMMUNITY)
Admission: EM | Admit: 2014-09-26 | Discharge: 2014-09-29 | DRG: 435 | Disposition: A | Discharge status: Home / Self Care | Payer: Medicare HMO | Attending: Internal Medicine | Admitting: Internal Medicine

## 2014-09-26 ENCOUNTER — Encounter (HOSPITAL_COMMUNITY): Payer: Self-pay | Admitting: Emergency Medicine

## 2014-09-26 DIAGNOSIS — I10 Essential (primary) hypertension: Secondary | ICD-10-CM | POA: Diagnosis present

## 2014-09-26 DIAGNOSIS — Z79899 Other long term (current) drug therapy: Secondary | ICD-10-CM | POA: Diagnosis not present

## 2014-09-26 DIAGNOSIS — D62 Acute posthemorrhagic anemia: Secondary | ICD-10-CM | POA: Diagnosis present

## 2014-09-26 DIAGNOSIS — E1165 Type 2 diabetes mellitus with hyperglycemia: Secondary | ICD-10-CM | POA: Diagnosis present

## 2014-09-26 DIAGNOSIS — IMO0002 Reserved for concepts with insufficient information to code with codable children: Secondary | ICD-10-CM

## 2014-09-26 DIAGNOSIS — D638 Anemia in other chronic diseases classified elsewhere: Secondary | ICD-10-CM | POA: Diagnosis present

## 2014-09-26 DIAGNOSIS — C787 Secondary malignant neoplasm of liver and intrahepatic bile duct: Secondary | ICD-10-CM | POA: Diagnosis present

## 2014-09-26 DIAGNOSIS — D500 Iron deficiency anemia secondary to blood loss (chronic): Secondary | ICD-10-CM | POA: Diagnosis present

## 2014-09-26 DIAGNOSIS — E43 Unspecified severe protein-calorie malnutrition: Secondary | ICD-10-CM | POA: Diagnosis present

## 2014-09-26 DIAGNOSIS — C251 Malignant neoplasm of body of pancreas: Secondary | ICD-10-CM

## 2014-09-26 DIAGNOSIS — E860 Dehydration: Secondary | ICD-10-CM | POA: Diagnosis present

## 2014-09-26 DIAGNOSIS — K869 Disease of pancreas, unspecified: Secondary | ICD-10-CM

## 2014-09-26 DIAGNOSIS — E876 Hypokalemia: Secondary | ICD-10-CM | POA: Diagnosis present

## 2014-09-26 DIAGNOSIS — E871 Hypo-osmolality and hyponatremia: Secondary | ICD-10-CM | POA: Diagnosis present

## 2014-09-26 DIAGNOSIS — Z794 Long term (current) use of insulin: Secondary | ICD-10-CM

## 2014-09-26 DIAGNOSIS — K8689 Other specified diseases of pancreas: Secondary | ICD-10-CM | POA: Diagnosis present

## 2014-09-26 DIAGNOSIS — Z681 Body mass index (BMI) 19 or less, adult: Secondary | ICD-10-CM | POA: Diagnosis not present

## 2014-09-26 DIAGNOSIS — C259 Malignant neoplasm of pancreas, unspecified: Principal | ICD-10-CM | POA: Diagnosis present

## 2014-09-26 DIAGNOSIS — E118 Type 2 diabetes mellitus with unspecified complications: Secondary | ICD-10-CM

## 2014-09-26 DIAGNOSIS — D72829 Elevated white blood cell count, unspecified: Secondary | ICD-10-CM | POA: Diagnosis present

## 2014-09-26 DIAGNOSIS — R1084 Generalized abdominal pain: Secondary | ICD-10-CM | POA: Diagnosis present

## 2014-09-26 DIAGNOSIS — R636 Underweight: Secondary | ICD-10-CM | POA: Diagnosis present

## 2014-09-26 LAB — PROTIME-INR
INR: 1.24 (ref 0.00–1.49)
PROTHROMBIN TIME: 15.7 s — AB (ref 11.6–15.2)

## 2014-09-26 LAB — COMPREHENSIVE METABOLIC PANEL WITH GFR
ALT: 7 U/L (ref 0–35)
AST: 11 U/L (ref 0–37)
Albumin: 2.6 g/dL — ABNORMAL LOW (ref 3.5–5.2)
Alkaline Phosphatase: 107 U/L (ref 39–117)
Anion gap: 16 — ABNORMAL HIGH (ref 5–15)
BUN: 16 mg/dL (ref 6–23)
CO2: 25 meq/L (ref 19–32)
Calcium: 10.1 mg/dL (ref 8.4–10.5)
Chloride: 91 meq/L — ABNORMAL LOW (ref 96–112)
Creatinine, Ser: 0.69 mg/dL (ref 0.50–1.10)
GFR calc Af Amer: >90 mL/min (ref >90)
GFR calc non Af Amer: 83 mL/min — ABNORMAL LOW (ref >90)
Glucose, Bld: 199 mg/dL — ABNORMAL HIGH (ref 70–99)
Potassium: 4.1 meq/L (ref 3.7–5.3)
Sodium: 132 meq/L — ABNORMAL LOW (ref 137–147)
Total Bilirubin: 0.4 mg/dL (ref 0.3–1.2)
Total Protein: 8.2 g/dL (ref 6.0–8.3)

## 2014-09-26 LAB — CBC WITH DIFFERENTIAL/PLATELET
Basophils Absolute: 0 K/uL (ref 0.0–0.1)
Basophils Relative: 0 % (ref 0–1)
Eosinophils Absolute: 0.1 K/uL (ref 0.0–0.7)
Eosinophils Relative: 1 % (ref 0–5)
HCT: 32 % — ABNORMAL LOW (ref 36.0–46.0)
Hemoglobin: 10.7 g/dL — ABNORMAL LOW (ref 12.0–15.0)
Lymphocytes Relative: 16 % (ref 12–46)
Lymphs Abs: 1.9 K/uL (ref 0.7–4.0)
MCH: 27.3 pg (ref 26.0–34.0)
MCHC: 33.4 g/dL (ref 30.0–36.0)
MCV: 81.6 fL (ref 78.0–100.0)
Monocytes Absolute: 0.9 K/uL (ref 0.1–1.0)
Monocytes Relative: 8 % (ref 3–12)
Neutro Abs: 8.6 K/uL — ABNORMAL HIGH (ref 1.7–7.7)
Neutrophils Relative %: 75 % (ref 43–77)
Platelets: 372 K/uL (ref 150–400)
RBC: 3.92 MIL/uL (ref 3.87–5.11)
RDW: 13.4 % (ref 11.5–15.5)
WBC: 11.5 K/uL — ABNORMAL HIGH (ref 4.0–10.5)

## 2014-09-26 LAB — APTT: aPTT: 35 seconds (ref 24–37)

## 2014-09-26 LAB — CBG MONITORING, ED: GLUCOSE-CAPILLARY: 207 mg/dL — AB (ref 70–99)

## 2014-09-26 LAB — LIPASE, BLOOD: Lipase: 13 U/L (ref 11–59)

## 2014-09-26 MED ORDER — VITAMIN B-1 100 MG PO TABS
100.0000 mg | ORAL_TABLET | Freq: Every day | ORAL | Status: DC
  Administered 2014-09-26 – 2014-09-29 (×4): 100 mg via ORAL
  Filled 2014-09-26 (×4): qty 1

## 2014-09-26 MED ORDER — INSULIN ASPART 100 UNIT/ML ~~LOC~~ SOLN
0.0000 [IU] | Freq: Three times a day (TID) | SUBCUTANEOUS | Status: DC
  Administered 2014-09-27: 2 [IU] via SUBCUTANEOUS
  Administered 2014-09-27: 3 [IU] via SUBCUTANEOUS
  Administered 2014-09-28: 2 [IU] via SUBCUTANEOUS
  Administered 2014-09-28 – 2014-09-29 (×3): 3 [IU] via SUBCUTANEOUS

## 2014-09-26 MED ORDER — SODIUM CHLORIDE 0.9 % IV SOLN
INTRAVENOUS | Status: DC
  Administered 2014-09-26: 20:00:00 via INTRAVENOUS

## 2014-09-26 MED ORDER — ONDANSETRON HCL 4 MG PO TABS: 4.0000 mg | ORAL_TABLET | Freq: Four times a day (QID) | ORAL | Status: DC | PRN

## 2014-09-26 MED ORDER — FOLIC ACID 1 MG PO TABS
1.0000 mg | ORAL_TABLET | Freq: Every day | ORAL | Status: DC
  Administered 2014-09-26 – 2014-09-29 (×4): 1 mg via ORAL
  Filled 2014-09-26 (×4): qty 1

## 2014-09-26 MED ORDER — ALUM & MAG HYDROXIDE-SIMETH 200-200-20 MG/5ML PO SUSP: 30.0000 mL | Freq: Four times a day (QID) | ORAL | Status: DC | PRN

## 2014-09-26 MED ORDER — MORPHINE SULFATE 2 MG/ML IJ SOLN
1.0000 mg | INTRAMUSCULAR | Status: DC | PRN
  Administered 2014-09-27 – 2014-09-28 (×2): 1 mg via INTRAVENOUS
  Filled 2014-09-26 (×2): qty 1

## 2014-09-26 MED ORDER — ADULT MULTIVITAMIN W/MINERALS CH
1.0000 | ORAL_TABLET | Freq: Every day | ORAL | Status: DC
  Administered 2014-09-26 – 2014-09-29 (×4): 1 via ORAL
  Filled 2014-09-26 (×4): qty 1

## 2014-09-26 MED ORDER — LISINOPRIL 10 MG PO TABS
10.0000 mg | ORAL_TABLET | Freq: Every day | ORAL | Status: DC
  Administered 2014-09-27: 10 mg via ORAL
  Filled 2014-09-26: qty 1

## 2014-09-26 MED ORDER — ONDANSETRON HCL 4 MG/2ML IJ SOLN: 4.0000 mg | Freq: Four times a day (QID) | INTRAMUSCULAR | Status: DC | PRN

## 2014-09-26 MED ORDER — HEPARIN SODIUM (PORCINE) 5000 UNIT/ML IJ SOLN
5000.0000 [IU] | Freq: Three times a day (TID) | INTRAMUSCULAR | Status: DC
  Administered 2014-09-26 – 2014-09-27 (×2): 5000 [IU] via SUBCUTANEOUS
  Filled 2014-09-26 (×5): qty 1

## 2014-09-26 MED ORDER — ZOLPIDEM TARTRATE 5 MG PO TABS: 5.0000 mg | ORAL_TABLET | Freq: Every evening | ORAL | Status: DC | PRN

## 2014-09-26 NOTE — ED Notes (Signed)
Pt presents stating "Dr. Sandi Mealy sent her over here to have lab work done, so she can be admitted.  She has a procedure on Friday on her pancreas.  He told me to tell you that she is under [facility] care."  C/o upper abdominal pain.

## 2014-09-26 NOTE — H&P (Signed)
[facility] History and Physical  Debra [name] AYT:[id number] DOB: [birth year] DOA: 09/26/2014  Referring physician: Davonna Belling, MD PCP: Rodena Medin, MD   Chief Complaint: Abdominal Pain and Pancreatic mass  HPI: Debra [name] is a 73 y.o. female presents with abdominal pain and pancreatic mass. Patient states that she had been having pain in her abdomen for at least since January. She has also been losing weight. She states in the last few months she has lost 25 pounds involuntarily. She has a poor appetite. She states that she has felt nausea also. She has had had no diarrhea. She states that she has no fevers or chills noted. She has no cough or congestion. She does not smoke and does not drink. On Monday she had a CT scan and this shows an abdominal mass. Dr Sandi Mealy was planning on doing a procedure with a suspected diagnosis of pancreatic cancer.   Review of Systems:  Constitutional:  No weight loss, night sweats, Fevers, chills, fatigue.  HEENT:  No headaches, Difficulty swallowing,Tooth/dental problems,Sore throat,  No sneezing Cardio-vascular:  No chest pain, Orthopnea, PND, swelling in lower extremities  GI:  No heartburn, indigestion, ++abdominal pain, ++nausea, ++vomiting, diarrhea ++loss of appetite  Resp:  No shortness of breath with exertion or at rest. No excess mucus, no productive cough, No non-productive cough, No coughing up of blood.No change in color of mucus.No wheezing Skin:  no rash or lesions.  GU:  no dysuria, change in color of urine, no urgency or frequency Musculoskeletal:  No joint pain or swelling. No decreased range of motion Psych:  No change in mood or affect. No depression or anxiety  Past Medical History  Diagnosis Date  . Type II diabetes mellitus     "just dx'd today' (12/08/2013)  . Hypertension   . Acute renal failure   . DKA (diabetic ketoacidoses)    Past Surgical History  Procedure Laterality Date  . Tubal ligation    .  Dental surgery     Social History:  reports that she has never smoked. She has never used smokeless tobacco. She reports that she does not drink alcohol or use illicit drugs.  Allergies  Allergen Reactions  . Amoxicillin Rash    History reviewed. No pertinent family history.   Prior to Admission medications   Medication Sig Start Date End Date Taking? Authorizing Provider  Ibuprofen (ADVIL PO) Take 1 tablet by mouth daily as needed (stomach pain).   Yes Historical Provider, MD  lisinopril (PRINIVIL,ZESTRIL) 10 MG tablet Take 1 tablet (10 mg total) by mouth daily. 12/12/13  Yes Eugenie Filler, MD   Physical Exam: Filed Vitals:   09/26/14 1606 09/26/14 1709  BP: 154/93 158/95  Pulse: 136 125  Temp: 97.9 F (36.6 C)   TempSrc: Oral   Resp: 20 18  SpO2: 99% 100%    Wt Readings from Last 3 Encounters:  12/08/13 76.5 kg (168 lb 10.4 oz)    General:  Appears calm and comfortable Eyes: PERRL, normal lids, irises & conjunctiva ENT: grossly normal hearing, lips & tongue Neck: no LAD, masses or thyromegaly Cardiovascular: RRR, no m/r/g. No LE edema. Telemetry: sinus tach at 120 Respiratory: CTA bilaterally, no w/r/r. Normal respiratory effort. Abdomen: soft, c/o epigastric pain no rebound Skin: no rash or induration seen on limited exam Musculoskeletal: grossly normal tone BUE/BLE Psychiatric: grossly normal mood and affect, speech fluent and appropriate Neurologic: grossly non-focal.          Labs on Admission:  Basic Metabolic Panel:  Recent Labs Lab 09/26/14 1644  NA 132*  K 4.1  CL 91*  CO2 25  GLUCOSE 199*  BUN 16  CREATININE 0.69  CALCIUM 10.1   Liver Function Tests:  Recent Labs Lab 09/26/14 1644  AST 11  ALT 7  ALKPHOS 107  BILITOT 0.4  PROT 8.2  ALBUMIN 2.6*    Recent Labs Lab 09/26/14 1644  LIPASE 13   No results found for this basename: AMMONIA,  in the last 168 hours CBC:  Recent Labs Lab 09/26/14 1644  WBC 11.5*  NEUTROABS 8.6*   HGB 10.7*  HCT 32.0*  MCV 81.6  PLT 372   Cardiac Enzymes: No results found for this basename: CKTOTAL, CKMB, CKMBINDEX, TROPONINI,  in the last 168 hours  BNP (last 3 results) No results found for this basename: PROBNP,  in the last 8760 hours CBG:  Recent Labs Lab 09/26/14 1612  GLUCAP 207*    Radiological Exams on Admission: No results found.    Assessment/Plan Active Problems:   HTN (hypertension)   Pancreatic mass   Diabetes mellitus   1. Pancreatic mass 2. Diabetes Mellitus 3. Hypertension   Code Status: Full Code (must indicate code status--if unknown or must be presumed, indicate so) DVT Prophylaxis:Heparin Family Communication: Daughter (indicate person spoken with, if applicable, with phone number if by telephone) Disposition Plan: Home (indicate anticipated LOS)  Time spent: 54min  Dalaina Tates A [facility] Pager [phone]

## 2014-09-26 NOTE — ED Provider Notes (Signed)
CSN: [id number]     Arrival date & time 09/26/14  1547 History   First MD Initiated Contact with Patient 09/26/14 1611     Chief Complaint  Patient presents with  . Admission for procedure      (Consider location/radiation/quality/duration/timing/severity/associated sxs/prior Treatment) The history is provided by the patient and a relative.   patient was reportedly sent in for admission. Dr. Almyra Free, from gastroenterology was unable to get it recked admission. He has a newly discovered large pancreatic mass. Will have biopsy done by endoscopy on Friday. Has had some weight loss over the last 10 months. Has lost 75 pounds. Has had previous workup without clear cause. Has nausea vomiting some diarrhea. Worse after eating. No fevers. No cough. Patient was informed of the likely cancer and her family member stated they did not know this. Discussed with Dr. Almyra Free, who states they have been told.  Past Medical History  Diagnosis Date  . Type II diabetes mellitus     "just dx'd today' (12/08/2013)  . Hypertension   . Acute renal failure   . DKA (diabetic ketoacidoses)    Past Surgical History  Procedure Laterality Date  . Tubal ligation    . Dental surgery     History reviewed. No pertinent family history. History  Substance Use Topics  . Smoking status: Never Smoker   . Smokeless tobacco: Never Used  . Alcohol Use: No   OB History   Grav Para Term Preterm Abortions TAB SAB Ect Mult Living                 Review of Systems  Constitutional: Positive for fatigue and unexpected weight change. Negative for activity change and appetite change.  Eyes: Negative for pain.  Respiratory: Negative for chest tightness and shortness of breath.   Cardiovascular: Negative for chest pain and leg swelling.  Gastrointestinal: Positive for nausea, vomiting, abdominal pain and diarrhea.  Genitourinary: Negative for flank pain.  Musculoskeletal: Negative for back pain and neck stiffness.  Skin: Negative  for rash.  Neurological: Negative for weakness and numbness.  Psychiatric/Behavioral: Negative for behavioral problems.      Allergies  Amoxicillin  Home Medications   Prior to Admission medications   Medication Sig Start Date End Date Taking? Authorizing Provider  Ibuprofen (ADVIL PO) Take 1 tablet by mouth daily as needed (stomach pain).   Yes Historical Provider, MD  lisinopril (PRINIVIL,ZESTRIL) 10 MG tablet Take 1 tablet (10 mg total) by mouth daily. 12/12/13  Yes Eugenie Filler, MD   BP 158/95  Pulse 125  Temp(Src) 97.9 F (36.6 C) (Oral)  Resp 18  SpO2 100% Physical Exam  Nursing note and vitals reviewed. Constitutional: She is oriented to person, place, and time. She appears well-developed and well-nourished.  HENT:  Head: Normocephalic and atraumatic.  Eyes: EOM are normal.  Neck: Normal range of motion. Neck supple.  Cardiovascular: Normal rate, regular rhythm and normal heart sounds.   No murmur heard. Pulmonary/Chest: Effort normal and breath sounds normal. No respiratory distress. She has no wheezes. She has no rales.  Abdominal: Soft. Bowel sounds are normal. She exhibits mass. She exhibits no distension. There is no tenderness. There is no rebound and no guarding.   epigastric tenderness and mass.  Musculoskeletal: Normal range of motion.  Neurological: She is alert and oriented to person, place, and time. No cranial nerve deficit.  Skin: Skin is warm and dry.  Psychiatric: She has a normal mood and affect. Her speech is  normal.    ED Course  Procedures (including critical care time) Labs Review Labs Reviewed  CBC WITH DIFFERENTIAL - Abnormal; Notable for the following:    WBC 11.5 (*)    Hemoglobin 10.7 (*)    HCT 32.0 (*)    Neutro Abs 8.6 (*)    All other components within normal limits  COMPREHENSIVE METABOLIC PANEL - Abnormal; Notable for the following:    Sodium 132 (*)    Chloride 91 (*)    Glucose, Bld 199 (*)    Albumin 2.6 (*)    GFR  calc non Af Amer 83 (*)    Anion gap 16 (*)    All other components within normal limits  PROTIME-INR - Abnormal; Notable for the following:    Prothrombin Time 15.7 (*)    All other components within normal limits  CBG MONITORING, ED - Abnormal; Notable for the following:    Glucose-Capillary 207 (*)    All other components within normal limits  LIPASE, BLOOD  APTT    Imaging Review No results found.   EKG Interpretation None      MDM   Final diagnoses:  Pancreatic mass    Patient with pancreatic mass. He has had large amounts of weight loss. Lab work overall reassuring. Has been sent in by GI for biopsy on Friday. Will admit to internal medicine. I discussed with the hospitalist and Dr. Penne Lash R. Alvino Chapel, MD 09/26/14 Curly Rim

## 2014-09-27 ENCOUNTER — Other Ambulatory Visit: Payer: Self-pay | Admitting: Oncology

## 2014-09-27 ENCOUNTER — Inpatient Hospital Stay (HOSPITAL_COMMUNITY): Payer: Medicare HMO

## 2014-09-27 ENCOUNTER — Encounter (HOSPITAL_COMMUNITY): Payer: Self-pay | Admitting: Radiology

## 2014-09-27 DIAGNOSIS — C787 Secondary malignant neoplasm of liver and intrahepatic bile duct: Secondary | ICD-10-CM

## 2014-09-27 DIAGNOSIS — E43 Unspecified severe protein-calorie malnutrition: Secondary | ICD-10-CM | POA: Diagnosis present

## 2014-09-27 DIAGNOSIS — D72829 Elevated white blood cell count, unspecified: Secondary | ICD-10-CM | POA: Diagnosis present

## 2014-09-27 DIAGNOSIS — E119 Type 2 diabetes mellitus without complications: Secondary | ICD-10-CM

## 2014-09-27 DIAGNOSIS — R636 Underweight: Secondary | ICD-10-CM | POA: Diagnosis present

## 2014-09-27 DIAGNOSIS — C252 Malignant neoplasm of tail of pancreas: Secondary | ICD-10-CM

## 2014-09-27 DIAGNOSIS — D638 Anemia in other chronic diseases classified elsewhere: Secondary | ICD-10-CM | POA: Diagnosis present

## 2014-09-27 DIAGNOSIS — E871 Hypo-osmolality and hyponatremia: Secondary | ICD-10-CM | POA: Diagnosis present

## 2014-09-27 DIAGNOSIS — E1165 Type 2 diabetes mellitus with hyperglycemia: Secondary | ICD-10-CM | POA: Diagnosis present

## 2014-09-27 DIAGNOSIS — E118 Type 2 diabetes mellitus with unspecified complications: Secondary | ICD-10-CM

## 2014-09-27 DIAGNOSIS — IMO0002 Reserved for concepts with insufficient information to code with codable children: Secondary | ICD-10-CM | POA: Diagnosis present

## 2014-09-27 LAB — GLUCOSE, CAPILLARY
GLUCOSE-CAPILLARY: 150 mg/dL — AB (ref 70–99)
Glucose-Capillary: 187 mg/dL — ABNORMAL HIGH (ref 70–99)
Glucose-Capillary: 172 mg/dL — ABNORMAL HIGH (ref 70–99)
Glucose-Capillary: 179 mg/dL — ABNORMAL HIGH (ref 70–99)
Glucose-Capillary: 144 mg/dL — ABNORMAL HIGH (ref 70–99)

## 2014-09-27 LAB — TSH: TSH: 1.79 u[IU]/mL (ref 0.350–4.500)

## 2014-09-27 LAB — CBC
HCT: 27 % — ABNORMAL LOW (ref 36.0–46.0)
Hemoglobin: 9.2 g/dL — ABNORMAL LOW (ref 12.0–15.0)
MCH: 27.8 pg (ref 26.0–34.0)
MCHC: 34.1 g/dL (ref 30.0–36.0)
MCV: 81.6 fL (ref 78.0–100.0)
PLATELETS: 295 10*3/uL (ref 150–400)
RBC: 3.31 MIL/uL — ABNORMAL LOW (ref 3.87–5.11)
RDW: 13.4 % (ref 11.5–15.5)
WBC: 9.6 10*3/uL (ref 4.0–10.5)

## 2014-09-27 LAB — COMPREHENSIVE METABOLIC PANEL
ALT: 6 U/L (ref 0–35)
AST: 10 U/L (ref 0–37)
Albumin: 2.2 g/dL — ABNORMAL LOW (ref 3.5–5.2)
Alkaline Phosphatase: 91 U/L (ref 39–117)
Anion gap: 16 — ABNORMAL HIGH (ref 5–15)
BUN: 17 mg/dL (ref 6–23)
CALCIUM: 9.3 mg/dL (ref 8.4–10.5)
CO2: 23 meq/L (ref 19–32)
CREATININE: 0.63 mg/dL (ref 0.50–1.10)
Chloride: 96 mEq/L (ref 96–112)
GFR calc Af Amer: >90 mL/min (ref >90)
GFR, EST NON AFRICAN AMERICAN: 86 mL/min — AB (ref >90)
GLUCOSE: 181 mg/dL — AB (ref 70–99)
Potassium: 3.8 mEq/L (ref 3.7–5.3)
Sodium: 135 mEq/L — ABNORMAL LOW (ref 137–147)
Total Bilirubin: 0.3 mg/dL (ref 0.3–1.2)
Total Protein: 6.8 g/dL (ref 6.0–8.3)

## 2014-09-27 LAB — HEMOGLOBIN A1C
Hgb A1c MFr Bld: 8.4 % — ABNORMAL HIGH (ref <5.7)
MEAN PLASMA GLUCOSE: 194 mg/dL — AB (ref <117)

## 2014-09-27 MED ORDER — IOHEXOL 300 MG/ML  SOLN
80.0000 mL | Freq: Once | INTRAMUSCULAR | Status: AC | PRN
Start: 2014-09-27 — End: 2014-09-27
  Administered 2014-09-27: 80 mL via INTRAVENOUS

## 2014-09-27 MED ORDER — LISINOPRIL 20 MG PO TABS
20.0000 mg | ORAL_TABLET | Freq: Every day | ORAL | Status: DC
  Administered 2014-09-28 – 2014-09-29 (×2): 20 mg via ORAL
  Filled 2014-09-27 (×2): qty 1

## 2014-09-27 MED ORDER — PANCRELIPASE (LIP-PROT-AMYL) 12000-38000 UNITS PO CPEP
24000.0000 [IU] | ORAL_CAPSULE | Freq: Three times a day (TID) | ORAL | Status: DC
  Administered 2014-09-28 – 2014-09-29 (×4): 24000 [IU] via ORAL
  Filled 2014-09-27 (×7): qty 2

## 2014-09-27 NOTE — Progress Notes (Signed)
Inpatient Diabetes Program Recommendations  AACE/ADA: New Consensus Statement on Inpatient Glycemic Control (2013)  Target Ranges:  Prepandial:   less than 140 mg/dL      Peak postprandial:   less than 180 mg/dL (1-2 hours)      Critically ill patients:  140 - 180 mg/dL    Results for Debra [name], Debra [name] (MRN [id number]) as of 09/27/2014 08:42  Ref. Range 09/27/2014 07:42  Glucose-Capillary Latest Range: 70-99 mg/dL 172 (H)    Results for Debra [name], Debra [name] (MRN [id number]) as of 09/27/2014 08:42  Ref. Range 12/08/2013 02:40 09/26/2014 20:42  Hemoglobin A1C Latest Range: <5.7 % 14.5 (H) 8.4 (H)     Patient admitted with Abdominal pain, Pancreatic mass.  History of DM (diagnosed in January 2015), HTN.  Home DM Meds: None listed  Current DM Meds: Novolog Moderate SSI (started today)    **A1c shows much improvement since January.  Not sure if patient is taking any medication to control her blood sugars at home since none are listed.  Will follow up with patient today to further investigate.  **Note Novolog Moderate SSI started this AM.     Will follow Wyn Quaker RN, MSN, CDE Diabetes Coordinator Inpatient Diabetes Program Team Pager: [phone] (8a-10p)

## 2014-09-27 NOTE — Consult Note (Signed)
Reason for Consult: Pancreatic mass Referring Physician: [facility]  Concha Se HPI: This is a 73 year old female with a new diagnosis of a pancreatic mass.  The patient was evaluated in the office starting on 07/30/2014 for complaints of epigastric fullness and 50 lbs weight loss.  She had a number of complaints and it was difficult to isolate the source.  An EGD/colonoscopy was performed and she was noted to have H. Pylori, but no other overt abnormalities.  Treatment did not improve the situation and then a CT scan was ordered as she continued to lose weight.  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  Her clinical status continued to decline and I recommended that she present to the hospital for further evaluation.  Past Medical History  Diagnosis Date  . Type II diabetes mellitus     "just dx'd today' (12/08/2013)  . Hypertension   . Acute renal failure   . DKA (diabetic ketoacidoses)     Past Surgical History  Procedure Laterality Date  . Tubal ligation    . Dental surgery      History reviewed. No pertinent family history.  Social History:  reports that she has never smoked. She has never used smokeless tobacco. She reports that she does not drink alcohol or use illicit drugs.  Allergies:  Allergies  Allergen Reactions  . Amoxicillin Rash    Medications:  Scheduled: . folic acid  1 mg Oral Daily  . heparin  5,000 Units Subcutaneous 3 times per day  . insulin aspart  0-15 Units Subcutaneous TID WC  . [START ON 09/28/2014] lisinopril  20 mg Oral Daily  . multivitamin with minerals  1 tablet Oral Daily  . thiamine  100 mg Oral Daily   Continuous: . sodium chloride 50 mL/hr at 09/26/14 1958    Results for orders placed during the hospital encounter of 09/26/14 (from the past 24 hour(s))  CBG MONITORING, ED     Status: Abnormal   Collection Time    09/26/14  4:12 PM      Result Value Ref Range   Glucose-Capillary 207 (*) 70 - 99 mg/dL   Comment 1 Notify RN    CBC WITH DIFFERENTIAL     Status: Abnormal   Collection Time    09/26/14  4:44 PM      Result Value Ref Range   WBC 11.5 (*) 4.0 - 10.5 K/uL   RBC 3.92  3.87 - 5.11 MIL/uL   Hemoglobin 10.7 (*) 12.0 - 15.0 g/dL   HCT 32.0 (*) 36.0 - 46.0 %   MCV 81.6  78.0 - 100.0 fL   MCH 27.3  26.0 - 34.0 pg   MCHC 33.4  30.0 - 36.0 g/dL   RDW 13.4  11.5 - 15.5 %   Platelets 372  150 - 400 K/uL   Neutrophils Relative % 75  43 - 77 %   Neutro Abs 8.6 (*) 1.7 - 7.7 K/uL   Lymphocytes Relative 16  12 - 46 %   Lymphs Abs 1.9  0.7 - 4.0 K/uL   Monocytes Relative 8  3 - 12 %   Monocytes Absolute 0.9  0.1 - 1.0 K/uL   Eosinophils Relative 1  0 - 5 %   Eosinophils Absolute 0.1  0.0 - 0.7 K/uL   Basophils Relative 0  0 - 1 %   Basophils Absolute 0.0  0.0 - 0.1 K/uL  LIPASE, BLOOD  Status: None   Collection Time    09/26/14  4:44 PM      Result Value Ref Range   Lipase 13  11 - 59 U/L  COMPREHENSIVE METABOLIC PANEL     Status: Abnormal   Collection Time    09/26/14  4:44 PM      Result Value Ref Range   Sodium 132 (*) 137 - 147 mEq/L   Potassium 4.1  3.7 - 5.3 mEq/L   Chloride 91 (*) 96 - 112 mEq/L   CO2 25  19 - 32 mEq/L   Glucose, Bld 199 (*) 70 - 99 mg/dL   BUN 16  6 - 23 mg/dL   Creatinine, Ser 0.69  0.50 - 1.10 mg/dL   Calcium 10.1  8.4 - 10.5 mg/dL   Total Protein 8.2  6.0 - 8.3 g/dL   Albumin 2.6 (*) 3.5 - 5.2 g/dL   AST 11  0 - 37 U/L   ALT 7  0 - 35 U/L   Alkaline Phosphatase 107  39 - 117 U/L   Total Bilirubin 0.4  0.3 - 1.2 mg/dL   GFR calc non Af Amer 83 (*) >90 mL/min   GFR calc Af Amer >90  >90 mL/min   Anion gap 16 (*) 5 - 15  PROTIME-INR     Status: Abnormal   Collection Time    09/26/14  4:44 PM      Result Value Ref Range   Prothrombin Time 15.7 (*) 11.6 - 15.2 seconds   INR 1.24  0.00 - 1.49  APTT     Status: None   Collection Time    09/26/14  4:44 PM      Result Value Ref Range   aPTT 35  24 -  37 seconds  TSH     Status: None   Collection Time    09/26/14  8:41 PM      Result Value Ref Range   TSH 1.790  0.350 - 4.500 uIU/mL  HEMOGLOBIN A1C     Status: Abnormal   Collection Time    09/26/14  8:42 PM      Result Value Ref Range   Hemoglobin A1C 8.4 (*) <5.7 %   Mean Plasma Glucose 194 (*) <117 mg/dL  COMPREHENSIVE METABOLIC PANEL     Status: Abnormal   Collection Time    09/27/14  4:40 AM      Result Value Ref Range   Sodium 135 (*) 137 - 147 mEq/L   Potassium 3.8  3.7 - 5.3 mEq/L   Chloride 96  96 - 112 mEq/L   CO2 23  19 - 32 mEq/L   Glucose, Bld 181 (*) 70 - 99 mg/dL   BUN 17  6 - 23 mg/dL   Creatinine, Ser 0.63  0.50 - 1.10 mg/dL   Calcium 9.3  8.4 - 10.5 mg/dL   Total Protein 6.8  6.0 - 8.3 g/dL   Albumin 2.2 (*) 3.5 - 5.2 g/dL   AST 10  0 - 37 U/L   ALT 6  0 - 35 U/L   Alkaline Phosphatase 91  39 - 117 U/L   Total Bilirubin 0.3  0.3 - 1.2 mg/dL   GFR calc non Af Amer 86 (*) >90 mL/min   GFR calc Af Amer >90  >90 mL/min   Anion gap 16 (*) 5 - 15  CBC     Status: Abnormal   Collection Time    09/27/14  4:40 AM  Result Value Ref Range   WBC 9.6  4.0 - 10.5 K/uL   RBC 3.31 (*) 3.87 - 5.11 MIL/uL   Hemoglobin 9.2 (*) 12.0 - 15.0 g/dL   HCT 27.0 (*) 36.0 - 46.0 %   MCV 81.6  78.0 - 100.0 fL   MCH 27.8  26.0 - 34.0 pg   MCHC 34.1  30.0 - 36.0 g/dL   RDW 13.4  11.5 - 15.5 %   Platelets 295  150 - 400 K/uL  GLUCOSE, CAPILLARY     Status: Abnormal   Collection Time    09/27/14  6:09 AM      Result Value Ref Range   Glucose-Capillary 187 (*) 70 - 99 mg/dL   Comment 1 Notify RN    GLUCOSE, CAPILLARY     Status: Abnormal   Collection Time    09/27/14  7:42 AM      Result Value Ref Range   Glucose-Capillary 172 (*) 70 - 99 mg/dL   Comment 1 Documented in Chart     Comment 2 Notify RN    GLUCOSE, CAPILLARY     Status: Abnormal   Collection Time    09/27/14 11:37 AM      Result Value Ref Range   Glucose-Capillary 179 (*) 70 - 99 mg/dL   Comment 1  Documented in Chart     Comment 2 Notify RN       No results found.  ROS:  As stated above in the HPI otherwise negative.  Blood pressure 152/87, pulse 125, temperature 98.7 F (37.1 C), temperature source Oral, resp. rate 20, height 5\' 1"  (1.549 m), weight 45.904 kg (101 lb 3.2 oz), SpO2 100.00%.    PE: Gen: NAD, Alert and Oriented HEENT:  [place]/AT, EOMI Neck: Supple, no LAD Lungs: CTA Bilaterally CV: RRR without M/G/R ABM: Soft, NTND, +BS Ext: No C/C/E  Assessment/Plan: 1) Pancreatic mass with mets.   The patient has extensive disease and the prognosis is poor, however, there is the possibility of this lesion being a neuroendocrine tumor or lymphoma.  Lesions this size and location can be these etiologies, but adenocarcinoma is still a significant consideration.  Plan: 1) EUS with FNA tomorrow.  Lizvet Chunn D 09/27/2014, 12:57 PM

## 2014-09-27 NOTE — Progress Notes (Signed)
INITIAL NUTRITION ASSESSMENT  DOCUMENTATION CODES Per approved criteria  -Severe malnutrition in the context of chronic illness  Pt meets criteria for severe MALNUTRITION in the context of chronic illness as evidenced by PO intake < 75% for > one month, 47% body weight loss in one year.   INTERVENTION: -Recommend [employer] Instant Breakfast supplement BID inbetween meals -Trial of MagicCup BID w/meals -Encouraged intake of high protein/kcal snacks -Consider addition of appetite stimulant -RD to continue to monitor  NUTRITION DIAGNOSIS: Inadequate oral intake related to nausea/abd pain as evidenced by PO intake < 75%, 90 lb wt loss in one year.   Goal: Pt to meet >/= 90% of their estimated nutrition needs    Monitor:  Total protein/energy intake, labs, weights, supplement tolerance, education needs  Reason for Assessment: Consult to Assess/MST  73 y.o. female  Admitting Dx: Pancreatic mass  ASSESSMENT: Debra [name] is a 73 y.o. female presents with abdominal pain and pancreatic mass. Patient states that she had been having pain in her abdomen for at least since January. She has also been losing weight. She states in the last few months she has lost 25 pounds involuntarily. She has a poor appetite. She states that she has felt nausea also. She has had had no diarrhea. She states that she has no fevers or chills noted. She has no cough or congestion. She does not smoke and does not drink. On Monday she had a CT scan and this shows an abdominal mass. Dr Sandi Mealy was planning on doing a procedure with a suspected diagnosis of pancreatic cancer.   -Pt endorsed ongoing weight loss and poor PO for one year -Reported an unintentional wt loss of 90 lbs since 09/2013 (47% body weight loss, severe for time frame) -Diet recall indicates pt consuming three small meals daily. Breakfast consists of grits or cold cereal, lunch will either be skipped or small meal, and dinner consists of fruits and  vegetables. Pt has stopped eating meats as they have become too difficult to swallow and chew -Pt has tried to drink Boost and Ensure supplements, however they have induced nausea and abd pain, and pt stopped consuming them -Pt was willing to consume [employer], ordered BID as snacks and Care order has been placed. Discussed MagicCup, which pt was agreeable to try -Reviewed additional protein sources (nuts, milk, yogurt, cheese, beans) to incorporate as snacks and w/meals   Height: Ht Readings from Last 1 Encounters:  09/26/14 5\' 1"  (1.549 m)    Weight: Wt Readings from Last 1 Encounters:  09/26/14 101 lb 3.2 oz (45.904 kg)    Ideal Body Weight: 105 lb  % Ideal Body Weight: 96%  Wt Readings from Last 10 Encounters:  09/26/14 101 lb 3.2 oz (45.904 kg)  12/08/13 168 lb 10.4 oz (76.5 kg)    Usual Body Weight: 190 lb  % Usual Body Weight: 53%  BMI:  Body mass index is 19.13 kg/(m^2).  Estimated Nutritional Needs: Kcal: 1600-1800 Protein: 70-85 gram Fluid: >/= 1600 ml daily  Skin: WDL  Diet Order: Full Liquid  EDUCATION NEEDS: -Education needs addressed   Intake/Output Summary (Last 24 hours) at 09/27/14 1503 Last data filed at 09/27/14 1004  Gross per 24 hour  Intake    835 ml  Output    600 ml  Net    235 ml    Last BM: 10/20   Labs:   Recent Labs Lab 09/26/14 1644 09/27/14 0440  NA 132* 135*  K 4.1 3.8  CL 91* 96  CO2 25 23  BUN 16 17  CREATININE 0.69 0.63  CALCIUM 10.1 9.3  GLUCOSE 199* 181*    CBG (last 3)   Recent Labs  09/27/14 0609 09/27/14 0742 09/27/14 1137  GLUCAP 187* 172* 179*    Scheduled Meds: . folic acid  1 mg Oral Daily  . insulin aspart  0-15 Units Subcutaneous TID WC  . [START ON 09/28/2014] lisinopril  20 mg Oral Daily  . multivitamin with minerals  1 tablet Oral Daily  . thiamine  100 mg Oral Daily    Continuous Infusions: . sodium chloride 50 mL/hr at 09/26/14 1958    Past Medical History   Diagnosis Date  . Type II diabetes mellitus     "just dx'd today' (12/08/2013)  . Hypertension   . Acute renal failure   . DKA (diabetic ketoacidoses)     Past Surgical History  Procedure Laterality Date  . Tubal ligation    . Dental surgery      Atlee Abide [place] LDN Clinical Dietitian [phone]

## 2014-09-27 NOTE — Care Management Note (Signed)
CARE MANAGEMENT NOTE 09/27/2014  Patient:  Debra [name], Debra [name]   Account Number:  [id number]  Date Initiated:  09/27/2014  Documentation initiated by:  Marney Doctor  Subjective/Objective Assessment:   73 yo female admitted with abd pain and pancreatic mass.     Action/Plan:   From home alone   Anticipated DC Date:  10/01/2014   Anticipated DC Plan:  [place]  CM consult      Choice offered to / List presented to:             Status of service:  In process, will continue to follow Medicare Important Message given?   (If response is "NO", the following Medicare IM given date fields will be blank) Date Medicare IM given:   Medicare IM given by:   Date Additional Medicare IM given:   Additional Medicare IM given by:    Discharge Disposition:    Per UR Regulation:  Reviewed for med. necessity/level of care/duration of stay  If discussed at [place] of Stay Meetings, dates discussed:    Comments:  09/27/14 Marney Doctor RN,BSN,NCM Chart reviewed and CM following for DC needs.  PT eval and dietitian consult ordered.  Will await recommendations and assist as needed.

## 2014-09-27 NOTE — Progress Notes (Addendum)
Patient ID: Debra [name], female   DOB: [birth year], 73 y.o.   MRN: [id number] [facility] PROGRESS NOTE  Debra [name] QIH:[id number] DOB: February 18, 1941 DOA: 09/26/2014 PCP: Rodena Medin, MD  Brief narrative: 73 y.o. Female with DM, recently diagnosed in Jan 2015 and HgA1C at that time 14.5, HTN, presented to [facility] ED with main concern of > 6 months duration of persistent and now progressively worsening generalized abdominal pain, intermittent in nature, mixed sharp and dull, 10/10 in severity when present, occasionally but not consistently radiating to the back, with no specific alleviating or aggravating factors, associated with persistent nausea, poor oral intake, 25 lbs weight loss in the past few month. She has seen Dr. Benson Norway in the office and had CT done, which is worrisome for metastatic pancreatic cancer.    Assessment/Plan:    Principal Problem:   Pancreatic mass  Per CT findings worrisome for primary pancreatic adenocarcinoma with metastatic disease to the liver   Dr. Benson Norway plans on performing biopsy  Oncology consulted as well, will place order for CT chest for staging purpose   Appreciate assistance of GI and oncology teams  Active Problems:   Leukocytosis  WBC 11.5 on admission --> WNL this AM  Likely stress reaction as no clear infectious etiology noted but worrisome as tachycardia still present   Check UA, CT chest as noted above   Pt afebrile, will hold off on ABX   Repeat CBC in AM   Protein-calorie malnutrition, severe  Secondary to progressive nature of what appears to be metastatic pancreatic cancer   Nutritionist consulted, advance diet as pt able to tolerate   Consider adding appetite stimulant    Acute on chronic blood loss anemia   Slight drop in Hg since admission: 10.7 --> 9.2 (last known baseline Jan 8756 was 14)  Certainly worrisome for cancer causing bleeding   Currently not requiring transfusion, monitor H/H closely     Hyponatremia  Secondary to poor oral intake, pre renal etiology  IVF provided and Na trending up: 132 --> 135  Repeat BMP in AM   Underweight  From the illness outlined above, poor oral intake, weight loss    Uncontrolled diabetes mellitus with complications  Hg E3P initially when first diagnosed jan 2015 was 14.5 --> 8.4 this admission  Place on SSI while inpatient   Pt not on any antihyperglycemic regimen at home  Would avoid insulin due to significant drop in HgA1c in the past 9 months, in the setting of ? Pancreatic cancer  Consider placing on poral antihyperglycemic regimen: Glipizide or metformin    HTN (hypertension)  Pt on lisinopril at home 10 mg PO QD, will continue here but will increase the dose to 20 mg as BP is still > 140/90   DVT Prophylaxis   Heparin subQ while patient is in hospital   Code Status: Full.  Family Communication:  plan of care discussed with the patient Disposition Plan: Home when stable.   IV Access:   Peripheral IV Procedures and diagnostic studies:    CT abd/pelvis W/C 09/24/2014 Large aggressive appearing mass in the distal body and tail of the pancreas measuring 9.6 x 6.2 x 6.3 cm with extensive local invasion, including vascular involvement of predominantly venous structures in the upper abdomen as well as extensive metastatic disease to the liver. This is presumably a primary pancreatic adenocarcinoma. Trace volume of ascites.  Medical Consultants:   GI  Oncology  Other Consultants:   Nutritionist PT evaluation  Anti-Infectives:  None   Leisa Lenz, MD  [facility] Pager [phone]  If 7PM-7AM, please contact night-coverage [email or web address] Password [email or web address] 09/27/2014, 10:08 AM   LOS: 1 day   HPI/Subjective: No acute overnight events.  Objective: Filed Vitals:   09/26/14 1923 09/26/14 2004 09/27/14 0011 09/27/14 0443  BP: 155/90 152/87  152/87  Pulse: 123 126 100 125  Temp: 98.8 F (37.1 C) 98.3 F (36.8 C)   98.7 F (37.1 C)  TempSrc: Oral Oral  Oral  Resp: 18 20  20   Height:  5\' 1"  (1.549 m)    Weight:  45.904 kg (101 lb 3.2 oz)    SpO2: 99% 100%  100%    Intake/Output Summary (Last 24 hours) at 09/27/14 1008 Last data filed at 09/27/14 1004  Gross per 24 hour  Intake    835 ml  Output    600 ml  Net    235 ml    Exam:   General:  Pt is alert, follows commands appropriately, not in acute distress  Cardiovascular: Regular rhythm, tachycardic, S1/S2, no murmurs  Respiratory: Clear to auscultation bilaterally, no wheezing, diminished breath sounds at bases   Abdomen: Soft, some discomfort in upper abdomen, non distended, bowel sounds present  Extremities: No edema, pulses DP and PT palpable bilaterally  Neuro: Grossly nonfocal  Data Reviewed: Basic Metabolic Panel:  Recent Labs Lab 09/26/14 1644 09/27/14 0440  NA 132* 135*  K 4.1 3.8  CL 91* 96  CO2 25 23  GLUCOSE 199* 181*  BUN 16 17  CREATININE 0.69 0.63  CALCIUM 10.1 9.3   Liver Function Tests:  Recent Labs Lab 09/26/14 1644 09/27/14 0440  AST 11 10  ALT 7 6  ALKPHOS 107 91  BILITOT 0.4 0.3  PROT 8.2 6.8  ALBUMIN 2.6* 2.2*    Recent Labs Lab 09/26/14 1644  LIPASE 13   CBC:  Recent Labs Lab 09/26/14 1644 09/27/14 0440  WBC 11.5* 9.6  NEUTROABS 8.6*  --   HGB 10.7* 9.2*  HCT 32.0* 27.0*  MCV 81.6 81.6  PLT 372 295   CBG:  Recent Labs Lab 09/26/14 1612 09/27/14 0609 09/27/14 0742  GLUCAP 207* 187* 172*   Scheduled Meds: . folic acid  1 mg Oral Daily  . heparin  5,000 Units Subcutaneous 3 times per day  . insulin aspart  0-15 Units Subcutaneous TID WC  . lisinopril  10 mg Oral Daily  . multivitamin with minerals  1 tablet Oral Daily  . thiamine  100 mg Oral Daily   Continuous Infusions: . sodium chloride 50 mL/hr at 09/26/14 1958

## 2014-09-27 NOTE — Consult Note (Signed)
[place]  [phone] [phone]     ID: Arzu Mcgaughey DOB: 1941-02-04  MR#: [id number]  WCB#:[id number]  Patient Care Team: Rodena Medin, MD as PCP - General (Internal Medicine) PCP: Rodena Medin, MD .  CHIEF COMPLAINT: metastatic pancreatic cancer  CURRENT TREATMENT: pending definitive biopsy   HISTORY OF PRESENT ILLNESS: Mrs Ferdinand was admitted in DKA JAN 2015, with no prior history of diabetes. Her sugar and metabolic abnormalities were brought under control and she was discharged to her PCP's care. The patient then proceeded to lose weight-- from 168 lbs in JAN to 101 lbs now. She also c/o abdominal pain and nausea. She was evaluated for thyroid problems, which was unrevealing, and later referred to Dr Benson Norway, who performed EGD and colonoscopy again w/o a definitive diagnosis. On 09/24/2014 Dr Benson Norway obtained a CT of the abdomen and this showed a large pancreatic tail mass with multiple liver lesions. The patient was then admitted with a view to Bx, planned for 09/28/2014  INTERVAL HISTORY: I met with the patient, her daughter Lattie Haw, and Lisa's husband 09/27/2014 in her hospital room.  REVIEW OF SYSTEMS: The patient tells me she feels weak. She has no appetite. Food does not taste right. She denies vomiting. She says her pain is currently well controlled. She denies headaches, dizzyness or visual changes. There has been no change in bowel or bladder. habits. She is not aware of bleeding or swelling problems.  PAST MEDICAL HISTORY: Past Medical History  Diagnosis Date  . Type II diabetes mellitus     "just dx'd today' (12/08/2013)  . Hypertension   . Acute renal failure   . DKA (diabetic ketoacidoses)     PAST SURGICAL HISTORY: Past Surgical History  Procedure Laterality Date  . Tubal ligation    . Dental surgery      FAMILY HISTORY History reviewed. No pertinent family history. The patient's father and mother both died from lung cancer;  both were smokers. The patient has one brother, two sisters. One sister died from leukemia. There is no history if GI cancer in the family.  GYNECOLOGIC HISTORY:  No LMP recorded. Patient is postmenopausal. 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 She is divorced and lives alone, with no pets. Daughter Lattie Haw lives in [place] and works for [employer]. Daughter [name] lives in [place] and works for the city. The patient has 2 grandchildren. She attends a [employer]    ADVANCED DIRECTIVES: not in place; I urged the pt to complete a HCPOA document     HEALTH MAINTENANCE: History  Substance Use Topics  . Smoking status: Never Smoker   . Smokeless tobacco: Never Used  . Alcohol Use: No     Allergies  Allergen Reactions  . Amoxicillin Rash    Current Facility-Administered Medications  Medication Dose Route Frequency Provider Last Rate Last Dose  . 0.9 %  sodium chloride infusion   Intravenous Continuous Allyne Gee, MD 50 mL/hr at 09/26/14 1958    . alum & mag hydroxide-simeth (MAALOX/MYLANTA) 200-200-20 MG/5ML suspension 30 mL  30 mL Oral Q6H PRN Allyne Gee, MD      . folic acid (FOLVITE) tablet 1 mg  1 mg Oral Daily Allyne Gee, MD   1 mg at 09/27/14 1054  . insulin aspart (novoLOG) injection 0-15 Units  0-15 Units Subcutaneous  TID WC Allyne Gee, MD   2 Units at 09/27/14 1741  . [START ON 09/28/2014] lisinopril (PRINIVIL,ZESTRIL) tablet 20 mg  20 mg Oral Daily Robbie Lis, MD      . morphine 2 MG/ML injection 1 mg  1 mg Intravenous Q4H PRN Allyne Gee, MD   1 mg at 09/27/14 0011  . multivitamin with minerals tablet 1 tablet  1 tablet Oral Daily Allyne Gee, MD   1 tablet at 09/27/14 1054  . ondansetron (ZOFRAN) tablet 4 mg  4 mg Oral Q6H PRN Allyne Gee, MD       Or  . ondansetron [facility]) injection 4 mg  4 mg Intravenous Q6H PRN  Allyne Gee, MD      . thiamine (VITAMIN B-1) tablet 100 mg  100 mg Oral Daily Allyne Gee, MD   100 mg at 09/27/14 1054  . zolpidem (AMBIEN) tablet 5 mg  5 mg Oral QHS PRN Allyne Gee, MD        OBJECTIVE: older African American woman examined in bed Filed Vitals:   09/27/14 1440  BP: 155/84  Pulse: 119  Temp: 98.5 F (36.9 C)  Resp: 18     Body mass index is 19.13 kg/(m^2).    ECOG FS:2 - Symptomatic, <50% confined to bed  Ocular: Sclerae unicteric, pupils round and equal Ear-nose-throat: Oropharynx clear Lymphatic: No cervical or supraclavicular adenopathy Lungs no rales or rhonchi Heart regular rate and rhythm Abd soft, mild discomfort to palpation, positive bowel sounds MSK  no joint edema Neuro: non-focal, well-oriented, appropriate affect Breasts: deferred   LAB RESULTS:  CMP     Component Value Date/Time   NA 135* 09/27/2014 0440   K 3.8 09/27/2014 0440   CL 96 09/27/2014 0440   CO2 23 09/27/2014 0440   GLUCOSE 181* 09/27/2014 0440   BUN 17 09/27/2014 0440   CREATININE 0.63 09/27/2014 0440   CALCIUM 9.3 09/27/2014 0440   PROT 6.8 09/27/2014 0440   ALBUMIN 2.2* 09/27/2014 0440   AST 10 09/27/2014 0440   ALT 6 09/27/2014 0440   ALKPHOS 91 09/27/2014 0440   BILITOT 0.3 09/27/2014 0440   GFRNONAA 86* 09/27/2014 0440   GFRAA >90 09/27/2014 0440    I No results found for this basename: SPEP, UPEP,  kappa and lambda light chains    Lab Results  Component Value Date   WBC 9.6 09/27/2014   NEUTROABS 8.6* 09/26/2014   HGB 9.2* 09/27/2014   HCT 27.0* 09/27/2014   MCV 81.6 09/27/2014   PLT 295 09/27/2014    @LASTCHEMISTRY @  No results found for this basename: LABCA2    No components found with this basename: LABCA125     Recent Labs Lab 09/26/14 1644  INR 1.24    Urinalysis    Component Value Date/Time   COLORURINE YELLOW 12/07/2013 2046   APPEARANCEUR CLEAR 12/07/2013 2046   LABSPEC 1.030 12/07/2013 2046   PHURINE 5.0 12/07/2013 2046    GLUCOSEU >1000* 12/07/2013 2046   HGBUR TRACE* 12/07/2013 2046   BILIRUBINUR MODERATE* 12/07/2013 2046   KETONESUR >80* 12/07/2013 2046   PROTEINUR 30* 12/07/2013 2046   UROBILINOGEN 0.2 12/07/2013 2046   NITRITE NEGATIVE 12/07/2013 2046   LEUKOCYTESUR NEGATIVE 12/07/2013 2046    STUDIES: Ct Chest W Contrast  09/27/2014   CLINICAL DATA:  New diagnosis of pancreatic mass, epigastric fullness, 50 pound weight loss, personal history of type 2 diabetes mellitus, DKA, hypertension, acute renal failure  EXAM:  CT CHEST WITH CONTRAST  TECHNIQUE: Multidetector CT imaging of the chest was performed during intravenous contrast administration. Sagittal and coronal MPR images reconstructed from axial data set.  CONTRAST:  6m OMNIPAQUE IOHEXOL 300 MG/ML  SOLN  COMPARISON:  Chest radiograph 12/08/2013, CT abdomen and pelvis 09/24/2014  FINDINGS: Thoracic vascular structures patent on nondedicated exam.  Minimal pericardial effusion.  Calcified RIGHT hilar lymph nodes with calcified granulomata in RIGHT middle lobe and spleen.  Large pancreatic tail mass 9.9 x 6.5 cm image 51.  Multiple hepatic metastases up to 4.6 x 4.2 cm image 48.  Abdominal findings are better visualized on the recent CT abdomen.  Lungs otherwise clear.  No infiltrate, pleural effusion, pneumothorax or additional mass/ nodule.  Thrombosed splenic vein with LEFT upper quadrant collaterals.  No osseous metastases.  IMPRESSION: Old granulomatous disease.  No acute intra thoracic abnormalities.  Large pancreatic tail mass 9.9 x 6.5 cm with multiple hepatic metastases compatible with metastatic pancreatic neoplasm.   Electronically Signed   By: MLavonia DanaM.D.   On: 09/27/2014 14:32   Nm Gastric Emptying  09/10/2014   CLINICAL DATA:  Nausea, abdominal pain and weight loss.  EXAM: NUCLEAR MEDICINE GASTRIC EMPTYING SCAN  TECHNIQUE: After oral ingestion of radiolabeled meal, sequential abdominal images were obtained for 120 minutes. Residual percentage of activity  remaining within the stomach was calculated at 60 and 120 minutes.  RADIOPHARMACEUTICALS:  2 mCi Technetium 99-m labeled sulfur colloid  COMPARISON:  None.  FINDINGS: Expected location of the stomach in the left upper quadrant. Ingested meal empties the stomach gradually over the course of the study with 36% retention at 60 min and 15% retention at 120 min (normal retention less than 30% at a 120 min).  IMPRESSION: Normal gastric emptying study.   Electronically Signed   By: MKalman JewelsM.D.   On: 09/10/2014 12:27   Ct Abdomen Pelvis W Contrast  09/24/2014   CLINICAL DATA:  73year old female with unexpected 75 pound weight loss over the past 10 months and progressively worsening epigastric pain over the past 3 months. Nausea, vomiting and constipation.  EXAM: CT ABDOMEN AND PELVIS WITH CONTRAST  TECHNIQUE: Multidetector CT imaging of the abdomen and pelvis was performed using the standard protocol following bolus administration of intravenous contrast.  CONTRAST:  1049mOMNIPAQUE IOHEXOL 300 MG/ML  SOLN  COMPARISON:  No priors.  FINDINGS: Lower chest:  Calcified granulomas in near right middle lobe.  Hepatobiliary: Numerous hypovascular masses and nodules scattered throughout the liver, compatible with widespread metastatic disease. The largest of these is in segment 3 of the liver measuring 4.2 x 3.7 cm (image 23 of series 2). The largest lesion in the right lobe of the liver is in the periphery of segment 8 (image 13 of series 2) measuring 3.6 x 2.4 cm. Other smaller hepatic lesions are more well-defined and low-attenuation, likely cysts, including the largest of these which measures 1.9 cm in segment 2 of the liver. Gallbladder is unremarkable in appearance.  Pancreas: There is a large mass which demonstrates heterogeneous enhancement in the distal body and tail of the pancreas, which measures up to you 9.6 x 6.2 x 6.3 cm. Diffuse haziness in the peripancreatic fat may be related to lymphatic congestion,  tumor infiltration and/or inflammation. This mass appears intimately associated with numerous vascular structures, discussed below.  Spleen: Numerous calcifications in the spleen, compatible with calcified granulomas.  Adrenals/Urinary Tract: The adrenal glands and kidneys are normal in appearance bilaterally. Notably, the patient  appears to have a circumaortic left renal vein, with the anterior division of the vein likely occluded by the pancreatic mass.  Stomach/Bowel: The appearance of the stomach is normal. No pathologic dilatation of small bowel or colon. Numerous colonic diverticulae are noted, most evident in the region of the sigmoid colon, without surrounding inflammatory changes to suggest an acute diverticulitis at this time.  Vascular/Lymphatic: The previously described pancreatic mass has occluded the splenic vein and proximal superior mesenteric vein as well as the splenoportal confluence. Reconstitution of the portal vein is noted just distal to the splenoportal confluence. There is cavernous transformation in the porta hepatis, and numerous splenoportal collateral vessels throughout the upper abdomen. As previously mentioned, the anterior division of a circumaortic left renal vein is occluded by the mass, while posterior division is widely patent. The superior mesenteric artery comes in close proximity to the mass, but does not appear to be clearly involved at this time (although there is poorly defined surrounding fat, which could suggest early tumor infiltration or edema). The distal celiac axis and left gastric appear intimately associated with the lesion, without evidence of occlusion at this time. The lesion does appear to contact the undersurface of both of these vessels, however. The left gonadal vein appears thrombosed, presumably from associated left renal vein involvement from the mid pancreatic mass.  Reproductive: Retroflexed fibroid uterus with multifocal calcified fibroids. Ovaries are  atrophic.  Other: Trace volume of ascites in the cul-de-sac. No pneumoperitoneum.  Musculoskeletal: There are no aggressive appearing lytic or blastic lesions noted in the visualized portions of the skeleton.  IMPRESSION: 1. Large aggressive appearing mass in the distal body and tail of the pancreas measuring 9.6 x 6.2 x 6.3 cm with extensive local invasion, including vascular involvement of predominantly venous structures in the upper abdomen (as detailed above), as well as extensive metastatic disease to the liver. This is presumably a primary pancreatic adenocarcinoma. 2. Trace volume of ascites. 3. Additional incidental findings, as above. These results will be called to the ordering clinician or representative by the Radiologist Assistant, and communication documented in the PACS or zVision Dashboard.   Electronically Signed   By: Vinnie Langton M.D.   On: 09/24/2014 14:47    ASSESSMENT: 73 y.o. [place], [place] woman with a 9.9 cm pancreatic tail mass and multiple liver lesions c/w mets; biopsy scheduled 09/28/2014  PLAN: I spent approximatly one hour with the patient and her family discussing her situation. They understand her new development of diabetes in January likely was due to pancreatic dysfunction due to cancer. We reviewed the way pancreatic cancer spreads and the fact that radiation and surgery have no role in cases like hers. They understand if this is stage IV pancreatic cancer, as we believe, there is no cure, but there are treatment options, chiefly chemotherapy. In general, the goal of chemotherapy in this setting is palliative and it is well tolerated by most patients.  The patient asked no questions regarding survival or possible complications of this cancer; accordingly those issues remain to be discussed.  I encouraged Mrs Casimir to complete and notarize a HCPOA document and will try to facilitate that this admission. Will also start her on creon or equivalent to see if that  improves her nutrition.  I discussed with the patient that Dr Benay Spice is our pancreatic cancer specialist. I will arrange for him to see her the first week in NOV, by which time we should have results from the biopsy planned for tomorrow.  Please let me know if we can be of further help at this point.  Chauncey Cruel, MD   09/27/2014 6:30 PM

## 2014-09-28 ENCOUNTER — Telehealth: Payer: Self-pay | Admitting: Oncology

## 2014-09-28 ENCOUNTER — Encounter (HOSPITAL_COMMUNITY)
Admission: EM | Disposition: A | Discharge status: Home / Self Care | Payer: Self-pay | Source: Home / Self Care | Attending: Internal Medicine

## 2014-09-28 ENCOUNTER — Encounter (HOSPITAL_COMMUNITY): Payer: Self-pay | Admitting: *Deleted

## 2014-09-28 DIAGNOSIS — E871 Hypo-osmolality and hyponatremia: Secondary | ICD-10-CM

## 2014-09-28 DIAGNOSIS — D638 Anemia in other chronic diseases classified elsewhere: Secondary | ICD-10-CM

## 2014-09-28 DIAGNOSIS — E1165 Type 2 diabetes mellitus with hyperglycemia: Secondary | ICD-10-CM

## 2014-09-28 DIAGNOSIS — E118 Type 2 diabetes mellitus with unspecified complications: Secondary | ICD-10-CM

## 2014-09-28 DIAGNOSIS — E43 Unspecified severe protein-calorie malnutrition: Secondary | ICD-10-CM

## 2014-09-28 DIAGNOSIS — R636 Underweight: Secondary | ICD-10-CM

## 2014-09-28 DIAGNOSIS — C251 Malignant neoplasm of body of pancreas: Secondary | ICD-10-CM

## 2014-09-28 DIAGNOSIS — D72829 Elevated white blood cell count, unspecified: Secondary | ICD-10-CM

## 2014-09-28 HISTORY — PX: EUS: SHX5427

## 2014-09-28 LAB — GLUCOSE, CAPILLARY
GLUCOSE-CAPILLARY: 89 mg/dL (ref 70–99)
Glucose-Capillary: 150 mg/dL — ABNORMAL HIGH (ref 70–99)
Glucose-Capillary: 102 mg/dL — ABNORMAL HIGH (ref 70–99)
Glucose-Capillary: 167 mg/dL — ABNORMAL HIGH (ref 70–99)

## 2014-09-28 LAB — CBC
HCT: 26.3 % — ABNORMAL LOW (ref 36.0–46.0)
HEMOGLOBIN: 8.7 g/dL — AB (ref 12.0–15.0)
MCH: 27.7 pg (ref 26.0–34.0)
MCHC: 33.1 g/dL (ref 30.0–36.0)
MCV: 83.8 fL (ref 78.0–100.0)
Platelets: 271 10*3/uL (ref 150–400)
RBC: 3.14 MIL/uL — AB (ref 3.87–5.11)
RDW: 13.7 % (ref 11.5–15.5)
WBC: 8.1 10*3/uL (ref 4.0–10.5)

## 2014-09-28 LAB — BASIC METABOLIC PANEL
Anion gap: 12 (ref 5–15)
BUN: 12 mg/dL (ref 6–23)
CALCIUM: 9.2 mg/dL (ref 8.4–10.5)
CO2: 24 meq/L (ref 19–32)
Chloride: 97 mEq/L (ref 96–112)
Creatinine, Ser: 0.64 mg/dL (ref 0.50–1.10)
GFR calc Af Amer: >90 mL/min (ref >90)
GFR calc non Af Amer: 85 mL/min — ABNORMAL LOW (ref >90)
GLUCOSE: 154 mg/dL — AB (ref 70–99)
Potassium: 4.1 mEq/L (ref 3.7–5.3)
Sodium: 133 mEq/L — ABNORMAL LOW (ref 137–147)

## 2014-09-28 LAB — CANCER ANTIGEN 19-9: CA 19-9: 235.2 U/mL — ABNORMAL HIGH (ref <35.0)

## 2014-09-28 LAB — IRON AND TIBC
IRON: 18 ug/dL — AB (ref 42–135)
SATURATION RATIOS: 13 % — AB (ref 20–55)
TIBC: 134 ug/dL — AB (ref 250–470)
UIBC: 116 ug/dL — ABNORMAL LOW (ref 125–400)

## 2014-09-28 LAB — FERRITIN: FERRITIN: 775 ng/mL — AB (ref 10–291)

## 2014-09-28 SURGERY — UPPER ENDOSCOPIC ULTRASOUND (EUS) LINEAR
Anesthesia: Moderate Sedation

## 2014-09-28 MED ORDER — MENTHOL 3 MG MT LOZG
1.0000 | LOZENGE | OROMUCOSAL | Status: DC | PRN
  Filled 2014-09-28: qty 9

## 2014-09-28 MED ORDER — FENTANYL CITRATE 0.05 MG/ML IJ SOLN
INTRAMUSCULAR | Status: DC | PRN
  Administered 2014-09-28 (×2): 25 ug via INTRAVENOUS
  Administered 2014-09-28 (×2): 12.5 ug via INTRAVENOUS

## 2014-09-28 MED ORDER — BUTAMBEN-TETRACAINE-BENZOCAINE 2-2-14 % EX AERO
INHALATION_SPRAY | CUTANEOUS | Status: DC | PRN
  Administered 2014-09-28: 2 via TOPICAL

## 2014-09-28 MED ORDER — MIDAZOLAM HCL 10 MG/2ML IJ SOLN
INTRAMUSCULAR | Status: DC | PRN
  Administered 2014-09-28: 1 mg via INTRAVENOUS
  Administered 2014-09-28: 2 mg via INTRAVENOUS
  Administered 2014-09-28 (×2): 1 mg via INTRAVENOUS
  Administered 2014-09-28: 2 mg via INTRAVENOUS
  Administered 2014-09-28: 1 mg via INTRAVENOUS

## 2014-09-28 MED ORDER — MIDAZOLAM HCL 10 MG/2ML IJ SOLN
INTRAMUSCULAR | Status: AC
  Filled 2014-09-28: qty 2

## 2014-09-28 MED ORDER — FENTANYL CITRATE 0.05 MG/ML IJ SOLN
INTRAMUSCULAR | Status: AC
  Filled 2014-09-28: qty 2

## 2014-09-28 MED ORDER — ACETAMINOPHEN 325 MG PO TABS
650.0000 mg | ORAL_TABLET | Freq: Four times a day (QID) | ORAL | Status: DC | PRN
  Administered 2014-09-28 – 2014-09-29 (×2): 650 mg via ORAL
  Filled 2014-09-28 (×2): qty 2

## 2014-09-28 MED ORDER — SODIUM CHLORIDE 0.9 % IV SOLN
INTRAVENOUS | Status: DC
  Administered 2014-09-28: 500 mL via INTRAVENOUS

## 2014-09-28 MED ORDER — DIPHENHYDRAMINE HCL 50 MG/ML IJ SOLN
INTRAMUSCULAR | Status: AC
  Filled 2014-09-28: qty 1

## 2014-09-28 NOTE — Telephone Encounter (Signed)
Lm w/ Denyse Amass confirming appt for Nov 2015.

## 2014-09-28 NOTE — Progress Notes (Addendum)
Inpatient Diabetes Program Recommendations  AACE/ADA: New Consensus Statement on Inpatient Glycemic Control (2013)  Target Ranges:  Prepandial:   less than 140 mg/dL      Peak postprandial:   less than 180 mg/dL (1-2 hours)      Critically ill patients:  140 - 180 mg/dL     Results for Debra [name], Debra [name] (MRN [id number]) as of 09/28/2014 13:05  Ref. Range 09/27/2014 07:42 09/27/2014 11:37 09/27/2014 16:29 09/27/2014 21:45  Glucose-Capillary Latest Range: 70-99 mg/dL 172 (H) 179 (H) 144 (H) 150 (H)    Results for Debra [name], Debra [name] (MRN [id number]) as of 09/28/2014 13:05  Ref. Range 09/28/2014 07:41 09/28/2014 09:24  Glucose-Capillary Latest Range: 70-99 mg/dL 150 (H) 102 (H)     **Spoke with patient's daughter this AM about patient's home DM medications.  Daughter told me that patient was taking Glipizide and Metformin up until about 1 week ago.  Has been having a lot of stomach upset and nausea and went to see her PCP with [facility] (Dr. Posey Pronto).  Per daughter, Dr. Posey Pronto decided to stop Glipizide and Metformin and start patient back on Lantus insulin once daily.  Per daughter, Dr. Posey Pronto gave them a Rx for Lantus 10 units daily but they are having trouble filling it.  **Per daughter, RN at [facility] showed them how to use insulin pens and they would prefer insulin pens at time of d/c.  Not sure what the issue with their insurance is.  Explained to daughter that there are two other basal insulins available in pen form (Levemir and Toujeo) that patient could take instead of Lantus.  Instructed daughter to contact [facility] if she continues to have issues with her pharmacy.  **Patient's CBGs well controlled on current insulin of Novolog Moderate SSI.  Would not start Lantus yet.     Will follow Wyn Quaker RN, MSN, CDE Diabetes Coordinator Inpatient Diabetes Program Team Pager: [phone] (8a-10p)

## 2014-09-28 NOTE — Interval H&P Note (Signed)
History and Physical Interval Note:  09/28/2014 9:37 AM  Debra [name]  has presented today for surgery, with the diagnosis of Pancreatic mass  The various methods of treatment have been discussed with the patient and family. After consideration of risks, benefits and other options for treatment, the patient has consented to  Procedure(s): UPPER ENDOSCOPIC ULTRASOUND (EUS) LINEAR (N/A) as a surgical intervention .  The patient's history has been reviewed, patient examined, no change in status, stable for surgery.  I have reviewed the patient's chart and labs.  Questions were answered to the patient's satisfaction.     Mirela Parsley D

## 2014-09-28 NOTE — H&P (View-Only) (Signed)
Reason for Consult: Pancreatic mass Referring Physician: [facility]  Concha Se HPI: This is a 73 year old female with a new diagnosis of a pancreatic mass.  The patient was evaluated in the office starting on 07/30/2014 for complaints of epigastric fullness and 50 lbs weight loss.  She had a number of complaints and it was difficult to isolate the source.  An EGD/colonoscopy was performed and she was noted to have H. Pylori, but no other overt abnormalities.  Treatment did not improve the situation and then a CT scan was ordered as she continued to lose weight.  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  Her clinical status continued to decline and I recommended that she present to the hospital for further evaluation.  Past Medical History  Diagnosis Date  . Type II diabetes mellitus     "just dx'd today' (12/08/2013)  . Hypertension   . Acute renal failure   . DKA (diabetic ketoacidoses)     Past Surgical History  Procedure Laterality Date  . Tubal ligation    . Dental surgery      History reviewed. No pertinent family history.  Social History:  reports that she has never smoked. She has never used smokeless tobacco. She reports that she does not drink alcohol or use illicit drugs.  Allergies:  Allergies  Allergen Reactions  . Amoxicillin Rash    Medications:  Scheduled: . folic acid  1 mg Oral Daily  . heparin  5,000 Units Subcutaneous 3 times per day  . insulin aspart  0-15 Units Subcutaneous TID WC  . [START ON 09/28/2014] lisinopril  20 mg Oral Daily  . multivitamin with minerals  1 tablet Oral Daily  . thiamine  100 mg Oral Daily   Continuous: . sodium chloride 50 mL/hr at 09/26/14 1958    Results for orders placed during the hospital encounter of 09/26/14 (from the past 24 hour(s))  CBG MONITORING, ED     Status: Abnormal   Collection Time    09/26/14  4:12 PM      Result Value Ref Range   Glucose-Capillary 207 (*) 70 - 99 mg/dL   Comment 1 Notify RN    CBC WITH DIFFERENTIAL     Status: Abnormal   Collection Time    09/26/14  4:44 PM      Result Value Ref Range   WBC 11.5 (*) 4.0 - 10.5 K/uL   RBC 3.92  3.87 - 5.11 MIL/uL   Hemoglobin 10.7 (*) 12.0 - 15.0 g/dL   HCT 32.0 (*) 36.0 - 46.0 %   MCV 81.6  78.0 - 100.0 fL   MCH 27.3  26.0 - 34.0 pg   MCHC 33.4  30.0 - 36.0 g/dL   RDW 13.4  11.5 - 15.5 %   Platelets 372  150 - 400 K/uL   Neutrophils Relative % 75  43 - 77 %   Neutro Abs 8.6 (*) 1.7 - 7.7 K/uL   Lymphocytes Relative 16  12 - 46 %   Lymphs Abs 1.9  0.7 - 4.0 K/uL   Monocytes Relative 8  3 - 12 %   Monocytes Absolute 0.9  0.1 - 1.0 K/uL   Eosinophils Relative 1  0 - 5 %   Eosinophils Absolute 0.1  0.0 - 0.7 K/uL   Basophils Relative 0  0 - 1 %   Basophils Absolute 0.0  0.0 - 0.1 K/uL  LIPASE, BLOOD  Status: None   Collection Time    09/26/14  4:44 PM      Result Value Ref Range   Lipase 13  11 - 59 U/L  COMPREHENSIVE METABOLIC PANEL     Status: Abnormal   Collection Time    09/26/14  4:44 PM      Result Value Ref Range   Sodium 132 (*) 137 - 147 mEq/L   Potassium 4.1  3.7 - 5.3 mEq/L   Chloride 91 (*) 96 - 112 mEq/L   CO2 25  19 - 32 mEq/L   Glucose, Bld 199 (*) 70 - 99 mg/dL   BUN 16  6 - 23 mg/dL   Creatinine, Ser 0.69  0.50 - 1.10 mg/dL   Calcium 10.1  8.4 - 10.5 mg/dL   Total Protein 8.2  6.0 - 8.3 g/dL   Albumin 2.6 (*) 3.5 - 5.2 g/dL   AST 11  0 - 37 U/L   ALT 7  0 - 35 U/L   Alkaline Phosphatase 107  39 - 117 U/L   Total Bilirubin 0.4  0.3 - 1.2 mg/dL   GFR calc non Af Amer 83 (*) >90 mL/min   GFR calc Af Amer >90  >90 mL/min   Anion gap 16 (*) 5 - 15  PROTIME-INR     Status: Abnormal   Collection Time    09/26/14  4:44 PM      Result Value Ref Range   Prothrombin Time 15.7 (*) 11.6 - 15.2 seconds   INR 1.24  0.00 - 1.49  APTT     Status: None   Collection Time    09/26/14  4:44 PM      Result Value Ref Range   aPTT 35  24 -  37 seconds  TSH     Status: None   Collection Time    09/26/14  8:41 PM      Result Value Ref Range   TSH 1.790  0.350 - 4.500 uIU/mL  HEMOGLOBIN A1C     Status: Abnormal   Collection Time    09/26/14  8:42 PM      Result Value Ref Range   Hemoglobin A1C 8.4 (*) <5.7 %   Mean Plasma Glucose 194 (*) <117 mg/dL  COMPREHENSIVE METABOLIC PANEL     Status: Abnormal   Collection Time    09/27/14  4:40 AM      Result Value Ref Range   Sodium 135 (*) 137 - 147 mEq/L   Potassium 3.8  3.7 - 5.3 mEq/L   Chloride 96  96 - 112 mEq/L   CO2 23  19 - 32 mEq/L   Glucose, Bld 181 (*) 70 - 99 mg/dL   BUN 17  6 - 23 mg/dL   Creatinine, Ser 0.63  0.50 - 1.10 mg/dL   Calcium 9.3  8.4 - 10.5 mg/dL   Total Protein 6.8  6.0 - 8.3 g/dL   Albumin 2.2 (*) 3.5 - 5.2 g/dL   AST 10  0 - 37 U/L   ALT 6  0 - 35 U/L   Alkaline Phosphatase 91  39 - 117 U/L   Total Bilirubin 0.3  0.3 - 1.2 mg/dL   GFR calc non Af Amer 86 (*) >90 mL/min   GFR calc Af Amer >90  >90 mL/min   Anion gap 16 (*) 5 - 15  CBC     Status: Abnormal   Collection Time    09/27/14  4:40 AM  Result Value Ref Range   WBC 9.6  4.0 - 10.5 K/uL   RBC 3.31 (*) 3.87 - 5.11 MIL/uL   Hemoglobin 9.2 (*) 12.0 - 15.0 g/dL   HCT 27.0 (*) 36.0 - 46.0 %   MCV 81.6  78.0 - 100.0 fL   MCH 27.8  26.0 - 34.0 pg   MCHC 34.1  30.0 - 36.0 g/dL   RDW 13.4  11.5 - 15.5 %   Platelets 295  150 - 400 K/uL  GLUCOSE, CAPILLARY     Status: Abnormal   Collection Time    09/27/14  6:09 AM      Result Value Ref Range   Glucose-Capillary 187 (*) 70 - 99 mg/dL   Comment 1 Notify RN    GLUCOSE, CAPILLARY     Status: Abnormal   Collection Time    09/27/14  7:42 AM      Result Value Ref Range   Glucose-Capillary 172 (*) 70 - 99 mg/dL   Comment 1 Documented in Chart     Comment 2 Notify RN    GLUCOSE, CAPILLARY     Status: Abnormal   Collection Time    09/27/14 11:37 AM      Result Value Ref Range   Glucose-Capillary 179 (*) 70 - 99 mg/dL   Comment 1  Documented in Chart     Comment 2 Notify RN       No results found.  ROS:  As stated above in the HPI otherwise negative.  Blood pressure 152/87, pulse 125, temperature 98.7 F (37.1 C), temperature source Oral, resp. rate 20, height 5\' 1"  (1.549 m), weight 45.904 kg (101 lb 3.2 oz), SpO2 100.00%.    PE: Gen: NAD, Alert and Oriented HEENT:  /AT, EOMI Neck: Supple, no LAD Lungs: CTA Bilaterally CV: RRR without M/G/R ABM: Soft, NTND, +BS Ext: No C/C/E  Assessment/Plan: 1) Pancreatic mass with mets.   The patient has extensive disease and the prognosis is poor, however, there is the possibility of this lesion being a neuroendocrine tumor or lymphoma.  Lesions this size and location can be these etiologies, but adenocarcinoma is still a significant consideration.  Plan: 1) EUS with FNA tomorrow.  Tameia Rafferty D 09/27/2014, 12:57 PM

## 2014-09-28 NOTE — Progress Notes (Signed)
NUTRITION FOLLOW UP/CONSULT   INTERVENTION: -Recommended that patient order [employer] Essentials tid.  Prepared some for patient and left additional packets in room. -Consider appetite stimulant -RD to continue to follow.  NUTRITION DIAGNOSIS: Inadequate oral intake related to nausea/abd pain as evidenced by PO intake < 75%, 90 lb wt loss in one year.-continues   Goal: Pt to meet >/= 90% of their estimated nutrition needs -not met   Monitor:  Total protein/energy intake, labs, weights, supplement tolerance, education needs  Reason for Assessment: Consult  73 y.o. female  Admitting Dx: Pancreatic mass  ASSESSMENT: Debra [name] is a 73 y.o. female presents with abdominal pain and pancreatic mass. Patient states that she had been having pain in her abdomen for at least since January. She has also been losing weight. She states in the last few months she has lost 25 pounds involuntarily. She has a poor appetite. She states that she has felt nausea also. She has had had no diarrhea. She states that she has no fevers or chills noted. She has no cough or congestion. She does not smoke and does not drink. On Monday she had a CT scan and this shows an abdominal mass. Dr Sandi Mealy was planning on doing a procedure with a suspected diagnosis of pancreatic cancer.   10/22: -Pt endorsed ongoing weight loss and poor PO for one year -Reported an unintentional wt loss of 90 lbs since 09/2013 (47% body weight loss, severe for time frame) -Diet recall indicates pt consuming three small meals daily. Breakfast consists of grits or cold cereal, lunch will either be skipped or small meal, and dinner consists of fruits and vegetables. Pt has stopped eating meats as they have become too difficult to swallow and chew -Pt has tried to drink Boost and Ensure supplements, however they have induced nausea and abd pain, and pt stopped consuming them -Pt was willing to consume [employer],  ordered BID as snacks and Care order has been placed. Discussed MagicCup, which pt was agreeable to try -Reviewed additional protein sources (nuts, milk, yogurt, cheese, beans) to incorporate as snacks and w/meals  10/23: Diet advanced to regular but patient has not eaten.  Poor appetite, decreased strength.  Drank NIKE yesterday and liked this.    Height: Ht Readings from Last 1 Encounters:  09/26/14 5' 1"  (1.549 m)    Weight: Wt Readings from Last 1 Encounters:  09/26/14 101 lb 3.2 oz (45.904 kg)    Ideal Body Weight: 105 lb  % Ideal Body Weight: 96%  Wt Readings from Last 10 Encounters:  09/26/14 101 lb 3.2 oz (45.904 kg)  09/26/14 101 lb 3.2 oz (45.904 kg)  12/08/13 168 lb 10.4 oz (76.5 kg)    Usual Body Weight: 190 lb  % Usual Body Weight: 53%  BMI:  Body mass index is 19.13 kg/(m^2).  Estimated Nutritional Needs: Kcal: 1600-1800 Protein: 70-85 gram Fluid: >/= 1600 ml daily  Skin: WDL  Diet Order: General  EDUCATION NEEDS: -Education needs addressed   Intake/Output Summary (Last 24 hours) at 09/28/14 1541 Last data filed at 09/28/14 0519  Gross per 24 hour  Intake      0 ml  Output    500 ml  Net   -500 ml    Last BM: 10/20   Labs:   Recent Labs Lab 09/26/14 1644 09/27/14 0440 09/28/14 0540  NA 132* 135* 133*  K 4.1 3.8 4.1  CL 91* 96 97  CO2 25 23 24  BUN 16 17 12   CREATININE 0.69 0.63 0.64  CALCIUM 10.1 9.3 9.2  GLUCOSE 199* 181* 154*    CBG (last 3)   Recent Labs  09/28/14 0741 09/28/14 0924 09/28/14 1335  GLUCAP 150* 102* 89    Scheduled Meds: . folic acid  1 mg Oral Daily  . insulin aspart  0-15 Units Subcutaneous TID WC  . lipase/protease/amylase  24,000 Units Oral TID AC  . lisinopril  20 mg Oral Daily  . multivitamin with minerals  1 tablet Oral Daily  . thiamine  100 mg Oral Daily    Continuous Infusions: . sodium chloride 50 mL/hr at 09/26/14 1958    Past Medical History  Diagnosis Date  .  Type II diabetes mellitus     "just dx'd today' (12/08/2013)  . Hypertension   . Acute renal failure   . DKA (diabetic ketoacidoses)     Past Surgical History  Procedure Laterality Date  . Tubal ligation    . Dental surgery      Antonieta Iba, RD, LDN Clinical Inpatient Dietitian Pager:  [phone] Weekend and after hours pager:  [phone]

## 2014-09-28 NOTE — Progress Notes (Addendum)
Chaplain consulted for advance directive.  Referral from SW.   Pt's family at bedside.  Chaplain delivered advance directive paper work.  Pt and family would like to go over paperwork prior to filling out.  Requested chaplain follow up to complete advance directive at later time.    Debra [name], Debra [name]

## 2014-09-28 NOTE — Op Note (Signed)
[facility] [place], [id number]   UPPER ENDOSCOPIC ULTRASOUND PROCEDURE REPORT     EXAM DATE: 09/28/2014  PATIENT NAME:          Debra [name], Debra [name]          MR#:        [id number]  BIRTHDATE:       02/26/1941     VISIT #:     [phone] ATTENDING:     Carol Ada, MD     STATUS:     inpatient ASSISTANT:      Luanne Bras and Shoffner, Trevor Mace MD: ASA CLASS:        Class III  INDICATIONS:  The patient is a 73 yr old female here for a lower endoscopic ultrasound due to pancreatic mass. PROCEDURE PERFORMED:     Upper Endoscopic Ultrasound with FNA   MEDICATIONS:     Versed 8 mg IV and Fentanyl 75 mcg IV  CONSENT: The patient understands the risks and benefits of the procedure and understands that these risks include, but are not limited to: sedation, allergic reaction, infection, perforation and/or bleeding. Alternative means of evaluation and treatment include, among others: physical exam, x-rays, and/or surgical intervention. The patient elects to proceed with this endoscopic procedure.  DESCRIPTION OF PROCEDURE: During intra-op preparation period all mechanical & medical equipment was checked for proper function. Hand hygiene and appropriate measures for infection prevention was taken. After the risks, benefits and alternatives of the procedure were thoroughly explained, Informed consent was verified, confirmed and timeout was successfully executed by the treatment team. The patient was then placed in the left, lateral, decubitus position and IV sedation was administered. Throughout the procedure, the patients blood pressure, pulse and oxygen saturations were monitored continuously. Under direct visualization, the    was introduced through the mouth and advanced to the second portion of the duodenum.  Water was used as necessary to provide an acoustic interface. The pulse, BP, and O2 saturation were monitored and documented by  the physician and nursing staff throughout the procedure. Upon completion of the imaging, water was removed and the patient was then discharged to recovery in stable condition with the appropriate post procedure care.   FINDINGS: In the distal body/tail of the pancreas a largte 5 x 7 hypoechic mass with cystic lesions were identified.  The mass was abutting the celiac axis, but there was no evidence of clear invasion.  I was not able to adequately visualize the course of the SMA.  Ectatic vessels were noted throughout the pancreas head.  I was not able to visualize the CBD.  A 1.8 cm hypoechoic lesion was identified in the left lobe of the liver consistent with the metastases noted on the CT scan.  Five passes with the 25 gauge FNA needle was made through the mass.    ADVERSE EVENTS:     No immediate.  IMPRESSIONS: 1) 5 x 7 Pancreatic body/tail mass abutting the celiac axis. 2) Hepatic metastases.  RECOMMENDATIONS:     1) Await FNA results.  Preliminary results consistent with malignancy.  ___________________________________ Carol Ada, MD eSigned:  Carol Ada, MD 09/28/2014 11:46 AM   cc:      PATIENT NAME:  Lavergne, Hiltunen MR#: [id number]

## 2014-09-28 NOTE — Progress Notes (Signed)
Progress Note   Debra [name] WJX:[id number] DOB: Apr 25, 1941 DOA: 09/26/2014 PCP: Rodena Medin, MD   Brief Narrative:   Debra [name] is an 73 y.o. female with a PMH of DM, recently diagnosed in Jan 2015 and HgA1C at that time 14.5, HTN, presented to [facility] ED with main concern of > 6 months duration of persistent and now progressively worsening generalized abdominal pain, intermittent in nature, mixed sharp and dull, 10/10 in severity when present, occasionally but not consistently radiating to the back, with no specific alleviating or aggravating factors, associated with persistent nausea, poor oral intake, 25 lbs weight loss in the past few month. She has seen Dr. Benson Norway in the office and had CT done, which is worrisome for metastatic pancreatic cancer.   Assessment/Plan:   Principal Problem:  Pancreatic mass   Per CT findings worrisome for primary pancreatic adenocarcinoma with metastatic disease to the liver   S/P endoscopic ultrasound with FNA biopsy today.  Findings supportive of likely pancreatic adenocarcinoma.  Seen by Dr. Jana Hakim with plant for outpatient followup with Dr. Benay Spice for palliative chemotherapy once diagnoses confirmed and staging workup completed.  Active Problems:  Leukocytosis   Transient, resolved.  Protein-calorie malnutrition, severe/ underweight   Secondary to progressive nature of what appears to be metastatic pancreatic cancer.   Nutritionist consulted, advance diet to carbohydrate modified.   Creon started.   Acute on chronic blood loss anemia   Slight drop in Hg since admission: 10.7 --> 9.2 (last known baseline Jan 2015 was 14)   Normocytic. Check iron studies.  Hyponatremia   Mild. TSH WNL.  Uncontrolled diabetes mellitus with complications   Hg O1H initially when first diagnosed jan 2015 was 14.5 --> 8.4 this admission   Currently on insulin, moderate scale SSI 4 times a day. CBGs 144-178.  Pt was prescribed Lantus 10 units  subcutaneously daily at home, but had not filled the prescription. Prior to this she was on metformin/glipizide.  Diabetes coordinator on board.   HTN (hypertension)   Pt on lisinopril at home 10 mg PO QD, will continue here but will increase the dose to 20 mg as BP is still > 140/90  DVT Prophylaxis   Heparin subQ while patient is in hospital   Code Status: Full.  Family Communication: Daughter updated at bedside. Disposition Plan: Home when stable.     IV Access:    Peripheral IV   Procedures and diagnostic studies:   CT abd/pelvis W/C 09/24/2014 Large aggressive appearing mass in the distal body and tail of the pancreas measuring 9.6 x 6.2 x 6.3 cm with extensive local invasion, including vascular involvement of predominantly venous structures in the upper abdomen as well as extensive metastatic disease to the liver. This is presumably a primary pancreatic adenocarcinoma. Trace volume of ascites.   Endoscopic ultrasound 09/28/14: 1) 5 x 7 Pancreatic body/tail mass abutting the celiac axis. 2) Hepatic metastases.   Medical Consultants:    Dr. Lurline Del, Oncology.  Dr. Carol Ada, GI.  Anti-Infectives:    None.  Subjective:   Debra [name] sedated post EUS.  RN reports some nausea (given Zofran).  Too sleepy to converse, but daughter updated.  Objective:    Filed Vitals:   09/27/14 0443 09/27/14 1440 09/27/14 2047 09/28/14 0518  BP: 152/87 155/84 163/93 131/76  Pulse: 125 119 114 111  Temp: 98.7 F (37.1 C) 98.5 F (36.9 C) 98.8 F (37.1 C) 98 F (36.7 C)  TempSrc: Oral Oral Oral Oral  Resp: 20 18 18 20   Height:      Weight:      SpO2: 100% 100% 100% 100%    Intake/Output Summary (Last 24 hours) at 09/28/14 0752 Last data filed at 09/28/14 0519  Gross per 24 hour  Intake    360 ml  Output    700 ml  Net   -340 ml    Exam: Gen:  NAD Cardiovascular:  Tachy/reg, No M/R/G Respiratory:  Lungs decreased Gastrointestinal:  Abdomen soft,  NT/ND, + BS Extremities:  No C/E/C   Data Reviewed:    Labs: Basic Metabolic Panel:  Recent Labs Lab 09/26/14 1644 09/27/14 0440 09/28/14 0540  NA 132* 135* 133*  K 4.1 3.8 4.1  CL 91* 96 97  CO2 25 23 24   GLUCOSE 199* 181* 154*  BUN 16 17 12   CREATININE 0.69 0.63 0.64  CALCIUM 10.1 9.3 9.2   GFR Estimated Creatinine Clearance: 44 ml/min (by C-G formula based on Cr of 0.64). Liver Function Tests:  Recent Labs Lab 09/26/14 1644 09/27/14 0440  AST 11 10  ALT 7 6  ALKPHOS 107 91  BILITOT 0.4 0.3  PROT 8.2 6.8  ALBUMIN 2.6* 2.2*    Recent Labs Lab 09/26/14 1644  LIPASE 13   Coagulation profile  Recent Labs Lab 09/26/14 1644  INR 1.24    CBC:  Recent Labs Lab 09/26/14 1644 09/27/14 0440 09/28/14 0540  WBC 11.5* 9.6 8.1  NEUTROABS 8.6*  --   --   HGB 10.7* 9.2* 8.7*  HCT 32.0* 27.0* 26.3*  MCV 81.6 81.6 83.8  PLT 372 295 271   CBG:  Recent Labs Lab 09/27/14 0742 09/27/14 1137 09/27/14 1629 09/27/14 2145 09/28/14 0741  GLUCAP 172* 179* 144* 150* 150*   Hgb A1c:  Recent Labs  09/26/14 2042  HGBA1C 8.4*   Thyroid function studies:  Recent Labs  09/26/14 2041  TSH 1.790   Microbiology No results found for this or any previous visit (from the past 240 hour(s)).   Medications:   . folic acid  1 mg Oral Daily  . insulin aspart  0-15 Units Subcutaneous TID WC  . lipase/protease/amylase  24,000 Units Oral TID AC  . lisinopril  20 mg Oral Daily  . multivitamin with minerals  1 tablet Oral Daily  . thiamine  100 mg Oral Daily   Continuous Infusions: . sodium chloride 50 mL/hr at 09/26/14 1958    Time spent: 25 minutes.   LOS: 2 days   Debra [name]  [facility] Pager [phone]. If unable to reach me by pager, please call my cell phone at [phone].  *Please refer to [email or web address], password [email or web address] to get updated schedule on who will round on this patient, as hospitalists switch teams weekly. If 7PM-7AM, please contact  night-coverage at [email or web address], password [email or web address] for any overnight needs.  09/28/2014, 7:52 AM

## 2014-09-28 NOTE — Progress Notes (Signed)
PT Cancellation Note  Patient Details Name: Debra [name] MRN: [id number] DOB: 07-14-1941   Cancelled Treatment:    Reason Eval/Treat Not Completed: Patient at procedure or test/unavailable; will check back as next day or as schedule allows;  [name], PT Pager: [phone] 09/28/2014   [name] 09/28/2014, 9:48 AM

## 2014-09-29 DIAGNOSIS — I10 Essential (primary) hypertension: Secondary | ICD-10-CM

## 2014-09-29 DIAGNOSIS — E860 Dehydration: Secondary | ICD-10-CM

## 2014-09-29 LAB — CBC
HEMATOCRIT: 27.1 % — AB (ref 36.0–46.0)
HEMOGLOBIN: 9.1 g/dL — AB (ref 12.0–15.0)
MCH: 27.5 pg (ref 26.0–34.0)
MCHC: 33.6 g/dL (ref 30.0–36.0)
MCV: 81.9 fL (ref 78.0–100.0)
Platelets: 297 10*3/uL (ref 150–400)
RBC: 3.31 MIL/uL — AB (ref 3.87–5.11)
RDW: 13.5 % (ref 11.5–15.5)
WBC: 11.4 10*3/uL — ABNORMAL HIGH (ref 4.0–10.5)

## 2014-09-29 LAB — GLUCOSE, CAPILLARY
GLUCOSE-CAPILLARY: 152 mg/dL — AB (ref 70–99)
GLUCOSE-CAPILLARY: 152 mg/dL — AB (ref 70–99)
GLUCOSE-CAPILLARY: 166 mg/dL — AB (ref 70–99)

## 2014-09-29 LAB — BASIC METABOLIC PANEL
Anion gap: 15 (ref 5–15)
BUN: 9 mg/dL (ref 6–23)
CHLORIDE: 96 meq/L (ref 96–112)
CO2: 22 meq/L (ref 19–32)
Calcium: 9.2 mg/dL (ref 8.4–10.5)
Creatinine, Ser: 0.58 mg/dL (ref 0.50–1.10)
GFR calc Af Amer: >90 mL/min (ref >90)
GFR calc non Af Amer: 88 mL/min — ABNORMAL LOW (ref >90)
Glucose, Bld: 171 mg/dL — ABNORMAL HIGH (ref 70–99)
POTASSIUM: 3.6 meq/L — AB (ref 3.7–5.3)
Sodium: 133 mEq/L — ABNORMAL LOW (ref 137–147)

## 2014-09-29 MED ORDER — HYDROCODONE-ACETAMINOPHEN 5-300 MG PO TABS: 1.0000 | ORAL_TABLET | Freq: Four times a day (QID) | ORAL | Status: DC | PRN

## 2014-09-29 MED ORDER — FOLIC ACID 1 MG PO TABS: 1.0000 mg | ORAL_TABLET | Freq: Every day | ORAL | Status: DC

## 2014-09-29 MED ORDER — POTASSIUM CHLORIDE CRYS ER 20 MEQ PO TBCR
40.0000 meq | EXTENDED_RELEASE_TABLET | Freq: Once | ORAL | Status: AC
  Administered 2014-09-29: 40 meq via ORAL
  Filled 2014-09-29: qty 2

## 2014-09-29 MED ORDER — INSULIN DETEMIR 100 UNIT/ML FLEXPEN
10.0000 [IU] | PEN_INJECTOR | Freq: Every day | SUBCUTANEOUS | Status: DC
Start: 2014-09-29 — End: 2014-10-11

## 2014-09-29 MED ORDER — ACETAMINOPHEN 325 MG PO TABS: 650.0000 mg | ORAL_TABLET | Freq: Four times a day (QID) | ORAL | Status: AC | PRN

## 2014-09-29 MED ORDER — INSULIN GLARGINE 100 UNIT/ML ~~LOC~~ SOLN: 10.0000 [IU] | Freq: Every day | SUBCUTANEOUS | Status: DC

## 2014-09-29 MED ORDER — ONDANSETRON HCL 4 MG PO TABS: 4.0000 mg | ORAL_TABLET | Freq: Four times a day (QID) | ORAL | Status: AC | PRN

## 2014-09-29 MED ORDER — THIAMINE HCL 100 MG PO TABS: 100.0000 mg | ORAL_TABLET | Freq: Every day | ORAL | Status: DC

## 2014-09-29 MED ORDER — PANCRELIPASE (LIP-PROT-AMYL) 24000-76000 UNITS PO CPEP: 24000.0000 [IU] | ORAL_CAPSULE | Freq: Three times a day (TID) | ORAL | Status: DC

## 2014-09-29 MED ORDER — ADULT MULTIVITAMIN W/MINERALS CH: 1.0000 | ORAL_TABLET | Freq: Every day | ORAL | Status: DC

## 2014-09-29 NOTE — Progress Notes (Signed)
Pt given d/c instructions, prescriptions, f/u appts, when to call MD, instructions on s/s hypo/hyperglycemia.  Pt verbalized steps to insulin administration and states she is comfortable with injections since she had to do insulin injections at home before.  Pt asked appropriate questions and verbalized understanding.  Belongings packed and awaiting family to take home.

## 2014-09-29 NOTE — Evaluation (Signed)
Physical Therapy Evaluation Patient Details Name: Debra [name] MRN: [id number] DOB: [birth year] Today's Date: 09/29/2014   History of Present Illness  Debra [name] is a 73 y.o. female presents with abdominal pain and pancreatic mass.  Now s/p endoscopy w/biopsy.  Clinical Impression  Patient presents with generalized weakness and some mild gait deviations.  Have educated her on fall prevention and need for assist for IADL's and community mobility.  Does not want referral at this time to outpatient PT.  Feel she is contemplating her diagnosis and wants to take "one day at a time."  May benefit from outpatient PT if she decides to participate in the future.  Informed her can get referral from MD after D/C.  No further PT needs at this time.    Follow Up Recommendations No PT follow up    Equipment Recommendations  None recommended by PT    Recommendations for Other Services       Precautions / Restrictions Precautions Precautions: Fall Restrictions Weight Bearing Restrictions: No      Mobility  Bed Mobility Overal bed mobility: Modified Independent                Transfers Overall transfer level: Modified independent                  Ambulation/Gait Ambulation/Gait assistance: Supervision Ambulation Distance (Feet): 150 Feet Assistive device: None Gait Pattern/deviations: Drifts right/left;Decreased stride length     General Gait Details: veers rt/lt with environmental scanning  Stairs            Wheelchair Mobility    Modified Rankin (Stroke Patients Only)       Balance Overall balance assessment: Needs assistance   Sitting balance-Leahy Scale: Fair       Standing balance-Leahy Scale: Fair Standing balance comment: generally weak and frail; educated on general fall prevention techniques and to have assist with community mobility                             Pertinent Vitals/Pain Pain Assessment: No/denies pain    Home  Living Family/patient expects to be discharged to:: Private residence Living Arrangements: Alone Available Help at Discharge: Family;Available 24 hours/day Type of Home: House Home Access: Level entry     Home Layout: Two level;Able to live on main level with bedroom/bathroom Home Equipment: None Additional Comments: daughter and other family members to stay and help    Prior Function Level of Independence: Independent               Hand Dominance   Dominant Hand: Right    Extremity/Trunk Assessment   Upper Extremity Assessment: Generalized weakness           Lower Extremity Assessment: Generalized weakness         Communication   Communication: No difficulties  Cognition Arousal/Alertness: Awake/alert Behavior During Therapy: Flat affect Overall Cognitive Status: Within Functional Limits for tasks assessed                      General Comments      Exercises        Assessment/Plan    PT Assessment Patent does not need any further PT services  PT Diagnosis Generalized weakness;Abnormality of gait   PT Problem List    PT Treatment Interventions     PT Goals (Current goals can be found in the Care Plan section) Acute Rehab PT Goals PT  Goal Formulation: All assessment and education complete, DC therapy    Frequency     Barriers to discharge        Co-evaluation               End of Session Equipment Utilized During Treatment: Gait belt Activity Tolerance: Patient tolerated treatment well Patient left: in bed;with call bell/phone within reach Nurse Communication: Other (comment) (wants to wash up)         Time: 1030-1056 PT Time Calculation (min): 26 min   Charges:   PT Evaluation $Initial PT Evaluation Tier I: 1 Procedure PT Treatments $Self Care/Home Management: 8-22   PT G Codes:          Saretta Dahlem,CYNDI 2014-10-26, 11:01 AM  Magda Kiel, PT [phone] 10/26/14

## 2014-09-29 NOTE — Discharge Instructions (Signed)
Pancreatic Cancer  Pancreatic cancer is an abnormal growth of tissue (tumor) in the pancreas that is cancerous (malignant). Unlike noncancerous (benign) tumors, malignant tumors can spread to other parts of your body. The pancreas is a gland located deep in the abdomen, between the stomach and the spine. The pancreas makes insulin and other hormones. These hormones help the body use or store the energy that comes from food. The pancreas also makes pancreatic juices. These juices contain enzymes that help digest food. Most pancreatic cancers begin in the ducts that carry pancreatic juices. When cancer of the pancreas spreads (metastasizes) outside the pancreas, cancer cells are often found in nearby lymph nodes. If the cancer has reached these nodes, it means that cancer cells may have spread to other lymph nodes or other tissues, such as the liver or lungs. Sometimes cancer of the pancreas spreads to the peritoneum. This is the tissue that lines the abdomen. CAUSES  The exact cause of pancreatic cancer is unknown.  RISK FACTORS There are a number of risk factors that can increase your chances of getting pancreatic cancer. They include:  Age. The likelihood of developing pancreatic cancer increases with age. Most pancreatic cancers occur in people older than 60 years.  Smoking. Cigarette smokers are 2 to 3 times more likely than nonsmokers to develop pancreatic cancer.  Diabetes, especially if you were diagnosed as an adult.  Being female.  Being African American.  Family history. The risk for developing pancreatic cancer triples if a person's mother, father, sister, or brother had the disease. Also, a family history of colon cancer or ovarian cancer increases the risk of pancreatic cancer.  Chronic pancreatitis. Chronic pancreatitis is a painful condition of the pancreas.  Exposure to certain chemicals in the workplace.  Being obese or eating a diet high in fat and red meat. SYMPTOMS    Pancreatic cancer is sometimes called a "silent disease." This is because early pancreatic cancer often does not cause symptoms. As the cancer grows, symptoms may include:  Weakness.  Abdominal pain.  Diarrhea.  Depression.  Loss of appetite.  Indigestion.  Pain in the upper abdomen or upper back.  Nausea and vomiting.  Yellowing of the skin or eyes (jaundice).  Back pain.  Weight loss.  Fatigue.  Clay-colored stools.  Unexplained blood clots.  Dark urine. DIAGNOSIS  Your caregiver will ask about your medical history. He or she may also perform a number of procedures, such as:  A physical exam. Your skin and eyes will be examined for signs of jaundice. The abdomen will be checked for changes in the area near the pancreas, liver, and gallbladder. Your caregiver will also check for an abnormal buildup of fluid in the abdomen (ascites).  Lab tests. Your caregiver may take blood and urine samples to check for bilirubin and other substances. Bilirubin is a substance that passes from the liver to the intestine through the gallbladder and bile duct. If the bile duct is blocked by a tumor, the bilirubin cannot pass through normally. Blockage of the bile duct may cause the level of bilirubin in the blood and urine to become very high.  Computed tomography (CT). An X-ray machine linked to a computer takes a series of detailed pictures of the pancreas and other organs and blood vessels in the abdomen.  Ultrasonography. The ultrasound device uses sound waves to produce pictures of the pancreas and other organs inside the abdomen.  Endoscopic retrograde cholangiopancreatography. A lighted tube (endoscope) is passed through the patient's  mouth and stomach, down into the small intestine. Then a smaller tube (catheter) is inserted through the endoscope into the bile ducts and pancreatic ducts. A dye is injected through the catheter into the ducts and X-rays are taken. The X-rays can show  whether the ducts are narrowed or blocked by a tumor.  Endoscopic ultrasonography. An endoscope is passed through the patient's mouth and stomach, down into the small intestine. An ultrasound device is placed down the endoscope to produce pictures of the area, including the pancreas.  Percutaneous transhepatic cholangiography. A dye is injected through a thin needle into the liver and X-rays are taken. Unless there is a blockage, the dye should move freely through the bile ducts. From the X-rays, your caregiver can tell whether there is a blockage from a tumor.  Taking a tissue sample (biopsy) from the pancreas. The sample is examined under a microscope to look for cancer cells. Your cancer will be staged according to its severity and extent. Staging is a careful attempt to categorize your cancer to help determine which treatment will be most appropriate. Factors involved in staging include the size of the tumor, whether the cancer has spread, and if so, to what parts of the body it has spread. You may need to have more tests to determine the stage of your cancer. The test results will help determine what treatment plan is best for you. STAGES   Stage I. The cancer is only found in the pancreas.  Stage II. The cancer has spread to nearby tissues and possibly to the lymph nodes, but not to the blood vessels.  Stage III. The cancer is surrounding the major blood vessels beside the pancreas and has possibly spread to the lymph nodes.  Stage IV. The cancer has spread to other parts of the body, such as the liver, lungs, or peritoneum. TREATMENT  Treatment generally begins within several weeks after the diagnosis. There will be time to talk with your caregiver about treatment choices, get a second opinion, and learn more about the disease. Your caregiver may refer you to a cancer specialist (oncologist).  Cancer of the pancreas is very hard to control with current treatments. For that reason, many  caregivers encourage patients with this disease to consider taking part in a clinical trial. Clinical trials are an important option for people with all stages of pancreatic cancer.  At this time, pancreatic cancer can be cured only when it is found at an early stage, before it has spread. However, other treatments may be able to control the disease and help patients live longer and feel better. When a cure or control of the disease is not possible, some patients choose palliative therapy. Palliative therapy aims to improve quality of life by controlling pain and other problems caused by this disease, but it does not cure the disease. Depending on the type and stage, pancreatic cancer may be treated with surgery, radiation therapy, or chemotherapy. Some patients have a combination of these therapies.  Surgery may be done to remove all or part of the pancreas. Sometimes the cancer cannot be completely removed. However, if the tumor is blocking the common bile duct or duodenum, the surgeon can place a mesh tube (stent) in the blocked area. This helps keep the duct or duodenum open.  Radiation therapy uses high-energy rays to kill cancer cells.  Chemotherapy is the use of drugs to kill cancer cells. Caregivers also give chemotherapy to help reduce pain and other problems caused by pancreatic  cancer. HOME CARE INSTRUCTIONS   Only take over-the-counter or prescription medicines for pain, discomfort, or fever as directed by your caregiver.  Maintain a healthy diet.  Consider joining a support group. This may help you learn to cope with the stress of having pancreatic cancer.  Seek advice to help you manage treatment side effects.  Keep all follow-up appointments as directed by your caregiver. SEEK IMMEDIATE MEDICAL CARE IF:   You have a sudden increase in pain.  Your skin or eyes turn more yellow.  You lose weight without trying.  You have a fever, especially during chemotherapy treatment or  after stent placement.  You notice new fatigue or weakness. Document Released: 11/07/2004 Document Revised: 02/15/2012 Document Reviewed: 12/18/2011 [facility] Patient Information 2015 [place], [place]. This information is not intended to replace advice given to you by your health care provider. Make sure you discuss any questions you have with your health care provider.

## 2014-09-29 NOTE — Discharge Summary (Signed)
Physician Discharge Summary  Debra [name] KDT:[id number] DOB: 1941-11-20 DOA: 09/26/2014  PCP: Rodena Medin, MD  Admit date: 09/26/2014 Discharge date: 09/29/2014   Recommendations for Outpatient Follow-Up:   1. The patient has F/U scheduled with Dr. Benay Spice on 10/11/14 to review her pathology and discuss treatment options for what is most certainly a pancreatic cancer.   Discharge Diagnosis:   Principal Problem:    Pancreatic mass suspicious for pancreatic cancer status post biopsy with pathology pending Active Problems:    Dehydration    HTN (hypertension)    Leukocytosis    Anemia of chronic disease    Hyponatremia    Protein-calorie malnutrition, severe    Underweight    Uncontrolled diabetes mellitus with complications    Hypokalemia  Discharge Condition: Improved.  Diet recommendation: Carbohydrate-modified. Heart healthy.   History of Present Illness:   Debra [name] is an 73 y.o. female with a PMH of DM, recently diagnosed in Jan 2015 and HgA1C at that time 14.5, HTN, presented to [facility] ED with main concern of > 6 months duration of persistent and now progressively worsening generalized abdominal pain, intermittent in nature, mixed sharp and dull, 10/10 in severity when present, occasionally but not consistently radiating to the back, with no specific alleviating or aggravating factors, associated with persistent nausea, poor oral intake, 25 lbs weight loss in the past few month. She has seen Dr. Benson Norway in the office and had CT done, which is worrisome for metastatic pancreatic cancer.   Hospital Course by Problem:   Principal Problem:  Pancreatic mass  Per CT findings worrisome for primary pancreatic adenocarcinoma with metastatic disease to the liver  S/P endoscopic ultrasound with FNA biopsy today. Findings supportive of likely pancreatic adenocarcinoma.  Seen by Dr. Jana Hakim with plant for outpatient followup with Dr. Benay Spice for palliative  chemotherapy once diagnoses confirmed and staging workup completed.  Active Problems:  Leukocytosis  Transient, resolved.  Protein-calorie malnutrition, severe/ underweight  Secondary to progressive nature of what appears to be metastatic pancreatic cancer.  Nutritionist consulted, diet advanced as tolerated.  Creon started.   Acute on chronic blood loss anemia  Slight drop in Hg since admission: 10.7 --> 9.2 (last known baseline Jan 2015 was 14)  Normocytic.  Ferritin 775.  Iron 18.  TIBC 134.  UIBC 116.  Findings consistent with anemia of chronic disease.  Hyponatremia/ hypokalemia. Mild. TSH WNL. K+ repleted prior to D/C.  Uncontrolled diabetes mellitus with complications  D/C home on Levemir (Lantus not covered by insurance). Pt was prescribed Lantus 10 units subcutaneously daily at home, but had not filled the prescription. Prior to this she was on metformin/glipizide.  Diabetes coordinator on board.   HTN (hypertension)  Continue Lisinopril.   Medical Consultants:    Dr. Lurline Del, Oncology.  Dr. Carol Ada, GI   Discharge Exam:   Filed Vitals:   09/29/14 1130  BP:   Pulse: 108  Temp:   Resp:    Filed Vitals:   09/28/14 2135 09/29/14 0553 09/29/14 1045 09/29/14 1130  BP: 141/74 161/91 153/87   Pulse: 110 122 136 108  Temp: 98 F (36.7 C) 98.8 F (37.1 C)    TempSrc: Oral Oral    Resp: 24 24    Height:      Weight:      SpO2: 100% 100% 100%     Gen:  NAD Cardiovascular:  RRR, No M/R/G Respiratory: Lungs CTAB Gastrointestinal: Abdomen soft, NT/ND with normal active bowel sounds. Extremities: No C/E/C  The results of significant diagnostics from this hospitalization (including imaging, microbiology, ancillary and laboratory) are listed below for reference.     Procedures and Diagnostic Studies:   CT abd/pelvis W/C 09/24/2014 Large aggressive appearing mass in the distal body and tail of the pancreas measuring 9.6 x 6.2 x 6.3 cm with  extensive local invasion, including vascular involvement of predominantly venous structures in the upper abdomen as well as extensive metastatic disease to the liver. This is presumably a primary pancreatic adenocarcinoma. Trace volume of ascites.   Endoscopic ultrasound 09/28/14: 1) 5 x 7 Pancreatic body/tail mass abutting the celiac axis. 2) Hepatic metastases.  Labs:   Basic Metabolic Panel:  Recent Labs Lab 09/26/14 1644 09/27/14 0440 09/28/14 0540 09/29/14 0554  NA 132* 135* 133* 133*  K 4.1 3.8 4.1 3.6*  CL 91* 96 97 96  CO2 25 23 24 22   GLUCOSE 199* 181* 154* 171*  BUN 16 17 12 9   CREATININE 0.69 0.63 0.64 0.58  CALCIUM 10.1 9.3 9.2 9.2   GFR Estimated Creatinine Clearance: 44 ml/min (by C-G formula based on Cr of 0.58). Liver Function Tests:  Recent Labs Lab 09/26/14 1644 09/27/14 0440  AST 11 10  ALT 7 6  ALKPHOS 107 91  BILITOT 0.4 0.3  PROT 8.2 6.8  ALBUMIN 2.6* 2.2*    Recent Labs Lab 09/26/14 1644  LIPASE 13   Coagulation profile  Recent Labs Lab 09/26/14 1644  INR 1.24    CBC:  Recent Labs Lab 09/26/14 1644 09/27/14 0440 09/28/14 0540 09/29/14 0554  WBC 11.5* 9.6 8.1 11.4*  NEUTROABS 8.6*  --   --   --   HGB 10.7* 9.2* 8.7* 9.1*  HCT 32.0* 27.0* 26.3* 27.1*  MCV 81.6 81.6 83.8 81.9  PLT 372 295 271 297   CBG:  Recent Labs Lab 09/28/14 0924 09/28/14 1335 09/28/14 1754 09/28/14 2136 09/29/14 0738  GLUCAP 102* 89 167* 152* 152*   Hgb A1c  Recent Labs  09/26/14 2042  HGBA1C 8.4*   Thyroid function studies  Recent Labs  09/26/14 2041  TSH 1.790   Anemia work up  Recent Labs  09/28/14 0821  FERRITIN 775*  TIBC 134*  IRON 18*     Discharge Instructions:       Discharge Instructions   Call MD for:  persistant nausea and vomiting    Complete by:  As directed      Call MD for:  severe uncontrolled pain    Complete by:  As directed      Diet - low sodium heart healthy    Complete by:  As directed       Diet Carb Modified    Complete by:  As directed      Discharge instructions    Complete by:  As directed   You were cared for by Dr. Jacquelynn Cree  (a hospitalist) during your hospital stay. If you have any questions about your discharge medications or the care you received while you were in the hospital after you are discharged, you can call the unit and ask to speak with the hospitalist on call if the hospitalist that took care of you is not available. Once you are discharged, your primary care physician will handle any further medical issues. Please note that NO REFILLS for any discharge medications will be authorized once you are discharged, as it is imperative that you return to your primary care physician (or establish a relationship with a primary care physician if  you do not have one) for your aftercare needs so that they can reassess your need for medications and monitor your lab values.  Any outstanding tests can be reviewed by your PCP at your follow up visit.  It is also important to review any medicine changes with your PCP.  Please bring these d/c instructions with you to your next visit so your physician can review these changes with you.   Dr. Benay Spice will go over your pathology report with you at your appointment on 10/11/14.     Increase activity slowly    Complete by:  As directed             Medication List         acetaminophen 325 MG tablet  Commonly known as:  TYLENOL  Take 2 tablets (650 mg total) by mouth every 6 (six) hours as needed for mild pain.     ADVIL PO  Take 1 tablet by mouth daily as needed (stomach pain).     folic acid 1 MG tablet  Commonly known as:  FOLVITE  Take 1 tablet (1 mg total) by mouth daily.     Hydrocodone-Acetaminophen 5-300 MG Tabs  Commonly known as:  VICODIN  Take 1 tablet by mouth every 6 (six) hours as needed.     Insulin Detemir 100 UNIT/ML Pen  Commonly known as:  LEVEMIR  Inject 10 Units into the skin daily at 10 pm.      lisinopril 10 MG tablet  Commonly known as:  PRINIVIL,ZESTRIL  Take 1 tablet (10 mg total) by mouth daily.     multivitamin with minerals Tabs tablet  Take 1 tablet by mouth daily.     ondansetron 4 MG tablet  Commonly known as:  ZOFRAN  Take 1 tablet (4 mg total) by mouth every 6 (six) hours as needed for nausea.     Pancrelipase (Lip-Prot-Amyl) 24000 UNITS Cpep  Take 1 capsule (24,000 Units total) by mouth 3 (three) times daily before meals.     thiamine 100 MG tablet  Take 1 tablet (100 mg total) by mouth daily.       Follow-up Information   Schedule an appointment as soon as possible for a visit with Rodena Medin, MD. (As needed)    Specialty:  Internal Medicine   Contact information:   [address] [place] [address] [phone]       Follow up with [name], MD. (At your scheduled appt time as noted below.)    Specialty:  Oncology   Contact information:   [place] [address] [phone]        Time coordinating discharge: 35 minutes.  Signed:  Sie Formisano  Pager [phone] [facility] 09/29/2014, 12:12 PM

## 2014-09-29 NOTE — Progress Notes (Signed)
Charnele Semple   DOB:1941-09-01   UD#:[id number]   CHY#:[id number]  Subjective: sitting in bed, comfortable, but admits to pain, well-controlled on current meds. Denies nausea, vomiting. Tolerated procedure well ("I don't remember it"). Daughter in room  Objective: older african american woman examined in bed Filed Vitals:   09/29/14 0553  BP: 161/91  Pulse: 122  Temp: 98.8 F (37.1 C)  Resp: 24    Body mass index is 19.13 kg/(m^2).  Intake/Output Summary (Last 24 hours) at 09/29/14 0814 Last data filed at 09/29/14 0553  Gross per 24 hour  Intake   2765 ml  Output    450 ml  Net   2315 ml     Sclerae unicteric  Oropharynx no thrush  No cervical or [place] adenopathy  Lungs clear  Heart regular rate and rhythm  Abdomen slightly distended, +BS, NT to mild palpation  Neuro nonfocal    CBG (last 3)   Recent Labs  09/28/14 1754 09/28/14 2136 09/29/14 0738  GLUCAP 167* 152* 152*     Labs:  Lab Results  Component Value Date   WBC 11.4* 09/29/2014   HGB 9.1* 09/29/2014   HCT 27.1* 09/29/2014   MCV 81.9 09/29/2014   PLT 297 09/29/2014   NEUTROABS 8.6* 09/26/2014    @LASTCHEMISTRY @  Urine Studies No results found for this basename: UACOL, UAPR, USPG, UPH, UTP, UGL, UKET, UBIL, UHGB, UNIT, UROB, ULEU, UEPI, UWBC, URBC, UBAC, CAST, CRYS, UCOM, BILUA,  in the last 72 hours  Basic Metabolic Panel:  Recent Labs Lab 09/26/14 1644 09/27/14 0440 09/28/14 0540 09/29/14 0554  NA 132* 135* 133* 133*  K 4.1 3.8 4.1 3.6*  CL 91* 96 97 96  CO2 25 23 24 22   GLUCOSE 199* 181* 154* 171*  BUN 16 17 12 9   CREATININE 0.69 0.63 0.64 0.58  CALCIUM 10.1 9.3 9.2 9.2   GFR Estimated Creatinine Clearance: 44 ml/min (by C-G formula based on Cr of 0.58). Liver Function Tests:  Recent Labs Lab 09/26/14 1644 09/27/14 0440  AST 11 10  ALT 7 6  ALKPHOS 107 91  BILITOT 0.4 0.3  PROT 8.2 6.8  ALBUMIN 2.6* 2.2*    Recent Labs Lab 09/26/14 1644  LIPASE 13   No results found  for this basename: AMMONIA,  in the last 168 hours Coagulation profile  Recent Labs Lab 09/26/14 1644  INR 1.24    CBC:  Recent Labs Lab 09/26/14 1644 09/27/14 0440 09/28/14 0540 09/29/14 0554  WBC 11.5* 9.6 8.1 11.4*  NEUTROABS 8.6*  --   --   --   HGB 10.7* 9.2* 8.7* 9.1*  HCT 32.0* 27.0* 26.3* 27.1*  MCV 81.6 81.6 83.8 81.9  PLT 372 295 271 297   Cardiac Enzymes: No results found for this basename: CKTOTAL, CKMB, CKMBINDEX, TROPONINI,  in the last 168 hours BNP: No components found with this basename: POCBNP,  CBG:  Recent Labs Lab 09/28/14 0924 09/28/14 1335 09/28/14 1754 09/28/14 2136 09/29/14 0738  GLUCAP 102* 89 167* 152* 152*   D-Dimer No results found for this basename: DDIMER,  in the last 72 hours Hgb A1c  Recent Labs  09/26/14 2042  HGBA1C 8.4*   Lipid Profile No results found for this basename: CHOL, HDL, LDLCALC, TRIG, CHOLHDL, LDLDIRECT,  in the last 72 hours Thyroid function studies  Recent Labs  09/26/14 2041  TSH 1.790   Anemia work up  Recent Labs  09/28/14 0821  FERRITIN 775*  TIBC 134*  IRON 18*  Microbiology No results found for this or any previous visit (from the past 240 hour(s)).    Studies:  Ct Chest W Contrast  09/27/2014   CLINICAL DATA:  New diagnosis of pancreatic mass, epigastric fullness, 50 pound weight loss, personal history of type 2 diabetes mellitus, DKA, hypertension, acute renal failure  EXAM: CT CHEST WITH CONTRAST  TECHNIQUE: Multidetector CT imaging of the chest was performed during intravenous contrast administration. Sagittal and coronal MPR images reconstructed from axial data set.  CONTRAST:  38mL OMNIPAQUE IOHEXOL 300 MG/ML  SOLN  COMPARISON:  Chest radiograph 12/08/2013, CT abdomen and pelvis 09/24/2014  FINDINGS: Thoracic vascular structures patent on nondedicated exam.  Minimal pericardial effusion.  Calcified RIGHT hilar lymph nodes with calcified granulomata in RIGHT middle lobe and spleen.   Large pancreatic tail mass 9.9 x 6.5 cm image 51.  Multiple hepatic metastases up to 4.6 x 4.2 cm image 48.  Abdominal findings are better visualized on the recent CT abdomen.  Lungs otherwise clear.  No infiltrate, pleural effusion, pneumothorax or additional mass/ nodule.  Thrombosed splenic vein with LEFT upper quadrant collaterals.  No osseous metastases.  IMPRESSION: Old granulomatous disease.  No acute intra thoracic abnormalities.  Large pancreatic tail mass 9.9 x 6.5 cm with multiple hepatic metastases compatible with metastatic pancreatic neoplasm.   Electronically Signed   By: Lavonia Dana M.D.   On: 09/27/2014 14:32    Assessment: 73 y.o. [place], [place] woman with a 9.9 cm pancreatic tail mass and multiple liver lesions c/w mets; s/p endoscopic Bx 09/28/2014, results pending  Plan: patient is agitating to go home. Discussed the fact that narcotics will constipate her and she will need to be on bowel prophylaxis. I expect her pathology will not be available until mid-week. She already has an appointment with Dr Benay Spice 10/11/2014-- patient is aware.  Will sign off at this time. Please let me know if I can be of further help.   Chauncey Cruel, MD 09/29/2014  8:14 AM

## 2014-10-01 ENCOUNTER — Encounter (HOSPITAL_COMMUNITY): Payer: Self-pay | Admitting: Gastroenterology

## 2014-10-10 ENCOUNTER — Encounter: Payer: Self-pay | Admitting: *Deleted

## 2014-10-11 ENCOUNTER — Ambulatory Visit (HOSPITAL_BASED_OUTPATIENT_CLINIC_OR_DEPARTMENT_OTHER): Payer: Medicare HMO | Admitting: Oncology

## 2014-10-11 ENCOUNTER — Encounter: Payer: Self-pay | Admitting: *Deleted

## 2014-10-11 ENCOUNTER — Telehealth: Payer: Self-pay | Admitting: Oncology

## 2014-10-11 ENCOUNTER — Other Ambulatory Visit: Payer: Medicare HMO

## 2014-10-11 ENCOUNTER — Telehealth: Payer: Self-pay | Admitting: *Deleted

## 2014-10-11 ENCOUNTER — Ambulatory Visit (HOSPITAL_BASED_OUTPATIENT_CLINIC_OR_DEPARTMENT_OTHER): Payer: Medicare HMO

## 2014-10-11 ENCOUNTER — Encounter: Payer: Self-pay | Admitting: Oncology

## 2014-10-11 VITALS — BP 137/80 | HR 125 | Temp 98.5°F | Resp 20 | Ht 61.0 in | Wt 102.1 lb

## 2014-10-11 DIAGNOSIS — C258 Malignant neoplasm of overlapping sites of pancreas: Secondary | ICD-10-CM

## 2014-10-11 DIAGNOSIS — C787 Secondary malignant neoplasm of liver and intrahepatic bile duct: Secondary | ICD-10-CM

## 2014-10-11 DIAGNOSIS — C252 Malignant neoplasm of tail of pancreas: Secondary | ICD-10-CM | POA: Insufficient documentation

## 2014-10-11 DIAGNOSIS — R110 Nausea: Secondary | ICD-10-CM

## 2014-10-11 LAB — CBC WITH DIFFERENTIAL/PLATELET
BASO%: 0.2 % (ref 0.0–2.0)
Basophils Absolute: 0 10*3/uL (ref 0.0–0.1)
EOS ABS: 0.2 10*3/uL (ref 0.0–0.5)
EOS%: 1.8 % (ref 0.0–7.0)
HCT: 29.5 % — ABNORMAL LOW (ref 34.8–46.6)
HEMOGLOBIN: 9.8 g/dL — AB (ref 11.6–15.9)
LYMPH#: 1.6 10*3/uL (ref 0.9–3.3)
LYMPH%: 16.7 % (ref 14.0–49.7)
MCH: 27.5 pg (ref 25.1–34.0)
MCHC: 33.2 g/dL (ref 31.5–36.0)
MCV: 82.6 fL (ref 79.5–101.0)
MONO#: 0.7 10*3/uL (ref 0.1–0.9)
MONO%: 7.3 % (ref 0.0–14.0)
NEUT%: 74 % (ref 38.4–76.8)
NEUTROS ABS: 7.2 10*3/uL — AB (ref 1.5–6.5)
Platelets: 537 10*3/uL — ABNORMAL HIGH (ref 145–400)
RBC: 3.57 10*6/uL — AB (ref 3.70–5.45)
RDW: 13.8 % (ref 11.2–14.5)
WBC: 9.8 10*3/uL (ref 3.9–10.3)

## 2014-10-11 LAB — COMPREHENSIVE METABOLIC PANEL (CC13)
ALK PHOS: 171 U/L — AB (ref 40–150)
ALT: 13 U/L (ref 0–55)
ANION GAP: 11 meq/L (ref 3–11)
AST: 15 U/L (ref 5–34)
Albumin: 2.5 g/dL — ABNORMAL LOW (ref 3.5–5.0)
BUN: 13.5 mg/dL (ref 7.0–26.0)
CHLORIDE: 98 meq/L (ref 98–109)
CO2: 25 meq/L (ref 22–29)
Calcium: 10.5 mg/dL — ABNORMAL HIGH (ref 8.4–10.4)
Creatinine: 0.7 mg/dL (ref 0.6–1.1)
Glucose: 113 mg/dl (ref 70–140)
Potassium: 4 mEq/L (ref 3.5–5.1)
SODIUM: 134 meq/L — AB (ref 136–145)
TOTAL PROTEIN: 8 g/dL (ref 6.4–8.3)
Total Bilirubin: 0.36 mg/dL (ref 0.20–1.20)

## 2014-10-11 MED ORDER — METOCLOPRAMIDE HCL 5 MG PO TABS: 5.0000 mg | ORAL_TABLET | Freq: Three times a day (TID) | ORAL | Status: DC

## 2014-10-11 MED ORDER — LIDOCAINE-PRILOCAINE 2.5-2.5 % EX CREA: 1.0000 "application " | TOPICAL_CREAM | CUTANEOUS | Status: DC | PRN

## 2014-10-11 MED ORDER — TRAMADOL HCL 50 MG PO TABS: 50.0000 mg | ORAL_TABLET | Freq: Four times a day (QID) | ORAL | Status: DC | PRN

## 2014-10-11 NOTE — Progress Notes (Signed)
Debra [name]  Clinical Social [name] was referred by nurse for assessment of psychosocial needs.  Clinical Social Worker met with patient and daughter, Debra [name]; son-in-law, Debra [name]; and sister, Debra [name] at [facility] after appointment with MD to offer support and assess for needs.  CSW introduced self and explained role of CSW and Support Team. Pt and family requesting possible help at home. CSW explained the differences between CNA (pvt duty nursing-paid out of pocket) and HH (medicare covered for a "skilled" need). CSW explained pt would have to be homebound and have a skilled need for medicare to cover home health. Insurance never covers CNA services in my experience. Pt and family stated understanding and were appreciative of the info. Pt starts chemo next week and the Support Team will attempt to check in and reassess needs.   Clinical Social [name] interventions: Emotional support HH and pvt duty nursing lists explained and provided Education  Debra [name], [name]  [name] Phone: [phone] Fax: [phone]

## 2014-10-11 NOTE — Progress Notes (Signed)
Presents to office for new patient appointment with her daughter, [name]; son-in-law, Denyse Amass; and sister, Hoyle Sauer Declines flu vaccine, saying she never takes it. Expresses desire to change her PCP to Dr. Benson Norway (GI) from Dr. Rodena Medin in [place]

## 2014-10-11 NOTE — Telephone Encounter (Signed)
Gave avs & cal for Nov. Sent Mess to sch tx. °

## 2014-10-11 NOTE — Progress Notes (Signed)
Patient came back to office for chemo class after seeing Dr. Benay Spice this am.   Per Maudie Mercury in Lab she stated she did not feel up to staying.  Too tired.   Will have scheduling call patient to reschedule for next Tuesday or Wednesday.

## 2014-10-11 NOTE — Telephone Encounter (Signed)
Referral was not added to schedule, added for day of chemo 11/12 and sent msg to [facility] to see if she can see pt in chemo room..... KJ

## 2014-10-11 NOTE — Telephone Encounter (Signed)
Per staff message and POF I have scheduled appts. Advised scheduler of appts, no available for treatment on 11/11 moved to 11/12  . JMW

## 2014-10-11 NOTE — Telephone Encounter (Signed)
, °

## 2014-10-11 NOTE — Progress Notes (Signed)
[facility] New Patient Consult   Referring MD: Rodena Medin, Md [address] [place], [place] [address]   Debra [name] 73 y.o.  1941/05/04    Reason for Referral: pancreas cancer   HPI: she developed abdominal pain beginning in January of this year. She reports being diagnosed with diabetes in January. She has lost a significant amount of weight over the past 9 months. She presented to the emergency room 10/21/2015for hospital admission after a CT confirmed a pancreas mass and liver metastases.  Dr. Benson Norway was consult and she was taken to an endoscopic ultrasound procedure 09/28/2014. In the distal body/tail of the pancreas a hypoechoic mass with cystic lesions was identified. The mass abutted the celiac axis without evidence of clear invasion. A 1.8 cm hypoechoic lesion was noted in the left lobe of the liver consistent with a metastasis. The mass was FNA biopsied. The pathology ([id number]) revealed adenocarcinoma.  She takes hydrocodone for abdominal pain. This causes somnolence. She also has back pain.her blood sugar has been running low.  Past Medical History  Diagnosis Date  . Type II diabetes mellitus January 2015      . Hypertension   . Metastatic pancreas cancer October 2015  . DKA (diabetic ketoacidoses) January 2015    .   G2 P2   Past Surgical History  Procedure Laterality Date  . Tubal ligation    . Dental surgery    . Eus N/A 09/28/2014    Procedure: UPPER ENDOSCOPIC ULTRASOUND (EUS) LINEAR;  Surgeon: Beryle Beams, MD;  Location: [facility];  Service: Endoscopy;  Laterality: N/A;    Medications: Reviewed  Allergies:  Allergies  Allergen Reactions  . Amoxicillin Rash    Family history: Her mother and father had lung cancer. They were smokers. Her paternal grandmother had "liver cancer ".  Social History:    she lives alone in [place]. She does not smoke cigarettes. No alcohol use. No transfusion history. She is a retired  Hydrographic surveyor. No risk factors for HIV or hepatitis.   ROS:   Positives include:anorexia, 80 pound weight loss in 2015, nausea with occasional emesis, constipation, abdomen and back pain relieved with hydrocodone which causes sedation  A complete ROS was otherwise negative.  Physical Exam:  Blood pressure 137/80, pulse 125, temperature 98.5 F (36.9 C), temperature source Oral, resp. rate 20, height 5\' 1"  (1.549 m), weight 102 lb 1.6 oz (46.312 kg), SpO2 100 %.  HEENT: oropharynx without visible masses, neck without mass Lungs: clear bilaterally Cardiac: regular rate and rhythm Abdomen: fullness in the mid upper abdomen with associated tenderness, no splenomegaly, no apparent ascites  Vascular: no leg edema Lymph nodes: no cervical, supra-clavicular, axillary, or inguinal nodes Neurologic: alert and oriented, the motor exam appears intact in the upper and lower extremities Skin: no rash Musculoskeletal: no spine tenderness   LAB:  CBC  Lab Results  Component Value Date   WBC 9.8 10/11/2014   HGB 9.8* 10/11/2014   HCT 29.5* 10/11/2014   MCV 82.6 10/11/2014   PLT 537* 10/11/2014   NEUTROABS 7.2* 10/11/2014     CMP      Component Value Date/Time   NA 134* 10/11/2014 1226   NA 133* 09/29/2014 0554   K 4.0 10/11/2014 1226   K 3.6* 09/29/2014 0554   CL 96 09/29/2014 0554   CO2 25 10/11/2014 1226   CO2 22 09/29/2014 0554   GLUCOSE 113 10/11/2014 1226   GLUCOSE 171* 09/29/2014 0554  BUN 13.5 10/11/2014 1226   BUN 9 09/29/2014 0554   CREATININE 0.7 10/11/2014 1226   CREATININE 0.58 09/29/2014 0554   CALCIUM 10.5* 10/11/2014 1226   CALCIUM 9.2 09/29/2014 0554   PROT 8.0 10/11/2014 1226   PROT 6.8 09/27/2014 0440   ALBUMIN 2.5* 10/11/2014 1226   ALBUMIN 2.2* 09/27/2014 0440   AST 15 10/11/2014 1226   AST 10 09/27/2014 0440   ALT 13 10/11/2014 1226   ALT 6 09/27/2014 0440   ALKPHOS 171* 10/11/2014 1226   ALKPHOS 91 09/27/2014 0440   BILITOT 0.36  10/11/2014 1226   BILITOT 0.3 09/27/2014 0440   GFRNONAA 88* 09/29/2014 0554   GFRAA >90 09/29/2014 0554    CA 19-9on 09/28/2014-235.2  Imaging:  Chest CT 09/27/2014-old granulomatous disease CT abdomen and pelvis 09/24/2014-numerous hypovascular masses in the liver compatible with metastatic disease. Large mass in the distal body and tail of the pancreas.occlusion of the splenic and proximal superior mesenteric veins. Cavernous transformation in the porta hepatis and numerous splenoportal collateral vessels. The anterior division of a circumaortic left renal vein is occluded by the mass.uterine fibroids  Assessment/Plan:   1. 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 Pain secondary to #1  3.   Nausea secondary to #1  4.   Diabetes  5.   Anorexia/weight loss   Disposition:   Debra [name] has been diagnosed with metastatic pancreas cancer. I discussed the diagnosis, prognosis and treatment options with Debra [name] and her family. We reviewed the CT images. She understands no therapy will be curative. We discussed supportive care versus a trial of systemic chemotherapy. She currently lives alone and is capable of self-care. She is able to venture outside of the home for activities. We discussed single agent gemcitabine and gemcitabine/Abraxane. I recommend a trial of every 2 week gemcitabine/Abraxane.  We reviewed the potential toxicities associated with this chemotherapy regimen including the chance for nausea/vomiting, mucositis, alopecia, diarrhea, and hematologic toxicity. We discussed the chance of a fever, allergic reaction, and pneumonitis. We reviewed the neuropathy associated with Abraxane. She agrees to proceed. She will attend chemotherapy teaching class.  Debra [name] will be referred for placement of a Port-A-Cath with the plan to begin a first cycle of  gemcitabine/Abraxane on 10/17/2014. We will see her for an office visit that day.  Her family reports the blood sugar has been running low. She will discontinue insulin and checked the blood sugar daily. She will contact us for a blood sugar of greater than 300.  We added tramadol for pain. She will discontinue folic acid, pancreatic enzymes, and thiamine. We prescribed Reglan for nausea. We discussed the potential for tardive dyskinesia.  Approximately 50 minutes were spent with the patient today. The majority of the time was used for counseling and coordination of care.  [place], [place] 10/11/2014, 5:00 PM

## 2014-10-12 ENCOUNTER — Telehealth: Payer: Self-pay | Admitting: Oncology

## 2014-10-12 ENCOUNTER — Other Ambulatory Visit: Payer: Self-pay | Admitting: Radiology

## 2014-10-12 ENCOUNTER — Other Ambulatory Visit: Payer: Self-pay | Admitting: *Deleted

## 2014-10-12 DIAGNOSIS — C252 Malignant neoplasm of tail of pancreas: Secondary | ICD-10-CM

## 2014-10-12 LAB — CANCER ANTIGEN 19-9: CA 19-9: 204.8 U/mL — ABNORMAL HIGH (ref <35.0)

## 2014-10-12 NOTE — Telephone Encounter (Signed)
PER STAFF MESSAGE FROM [facility] NURSE PT COULD NOT STAY FOR [facility] 11/5 - PLEASE R/S TO TUESDAY PRIOR TO PORT PLACEMENT OR WEDNESDAY AFTER F/U W/LT. [facility] R/S TO 11/11 AFTER F/U W/LT. APPT TIME REMAINS THE SAME. LMONVM INFORMING PT.

## 2014-10-12 NOTE — Telephone Encounter (Signed)
, °

## 2014-10-14 ENCOUNTER — Other Ambulatory Visit: Payer: Self-pay | Admitting: Oncology

## 2014-10-16 ENCOUNTER — Encounter (HOSPITAL_COMMUNITY): Payer: Self-pay

## 2014-10-16 ENCOUNTER — Encounter: Payer: Self-pay | Admitting: *Deleted

## 2014-10-16 ENCOUNTER — Ambulatory Visit (HOSPITAL_COMMUNITY)
Admission: RE | Admit: 2014-10-16 | Discharge: 2014-10-16 | Disposition: A | Discharge status: Home / Self Care | Payer: Medicare HMO | Source: Ambulatory Visit | Attending: Oncology | Admitting: Oncology

## 2014-10-16 ENCOUNTER — Other Ambulatory Visit: Payer: Self-pay | Admitting: Oncology

## 2014-10-16 ENCOUNTER — Other Ambulatory Visit: Payer: Medicare HMO

## 2014-10-16 DIAGNOSIS — I10 Essential (primary) hypertension: Secondary | ICD-10-CM | POA: Insufficient documentation

## 2014-10-16 DIAGNOSIS — C259 Malignant neoplasm of pancreas, unspecified: Secondary | ICD-10-CM | POA: Insufficient documentation

## 2014-10-16 DIAGNOSIS — Z794 Long term (current) use of insulin: Secondary | ICD-10-CM | POA: Insufficient documentation

## 2014-10-16 DIAGNOSIS — E119 Type 2 diabetes mellitus without complications: Secondary | ICD-10-CM | POA: Diagnosis not present

## 2014-10-16 DIAGNOSIS — C252 Malignant neoplasm of tail of pancreas: Secondary | ICD-10-CM

## 2014-10-16 LAB — CBC WITH DIFFERENTIAL/PLATELET
BASOS ABS: 0 10*3/uL (ref 0.0–0.1)
Basophils Relative: 0 % (ref 0–1)
Eosinophils Absolute: 0.3 10*3/uL (ref 0.0–0.7)
Eosinophils Relative: 3 % (ref 0–5)
HCT: 26.6 % — ABNORMAL LOW (ref 36.0–46.0)
Hemoglobin: 8.7 g/dL — ABNORMAL LOW (ref 12.0–15.0)
Lymphocytes Relative: 11 % — ABNORMAL LOW (ref 12–46)
Lymphs Abs: 1.4 10*3/uL (ref 0.7–4.0)
MCH: 27.5 pg (ref 26.0–34.0)
MCHC: 32.7 g/dL (ref 30.0–36.0)
MCV: 84.2 fL (ref 78.0–100.0)
MONO ABS: 1.1 10*3/uL — AB (ref 0.1–1.0)
Monocytes Relative: 9 % (ref 3–12)
Neutro Abs: 9.5 10*3/uL — ABNORMAL HIGH (ref 1.7–7.7)
Neutrophils Relative %: 77 % (ref 43–77)
Platelets: 459 10*3/uL — ABNORMAL HIGH (ref 150–400)
RBC: 3.16 MIL/uL — ABNORMAL LOW (ref 3.87–5.11)
RDW: 13.8 % (ref 11.5–15.5)
WBC: 12.4 10*3/uL — AB (ref 4.0–10.5)

## 2014-10-16 LAB — GLUCOSE, CAPILLARY: Glucose-Capillary: 128 mg/dL — ABNORMAL HIGH (ref 70–99)

## 2014-10-16 LAB — PROTIME-INR
INR: 1.29 (ref 0.00–1.49)
Prothrombin Time: 16.2 seconds — ABNORMAL HIGH (ref 11.6–15.2)

## 2014-10-16 LAB — APTT: aPTT: 37 seconds (ref 24–37)

## 2014-10-16 LAB — BASIC METABOLIC PANEL
Anion gap: 14 (ref 5–15)
BUN: 12 mg/dL (ref 6–23)
CALCIUM: 10.2 mg/dL (ref 8.4–10.5)
CO2: 25 mEq/L (ref 19–32)
CREATININE: 0.59 mg/dL (ref 0.50–1.10)
Chloride: 94 mEq/L — ABNORMAL LOW (ref 96–112)
GFR calc Af Amer: >90 mL/min (ref >90)
GFR calc non Af Amer: 87 mL/min — ABNORMAL LOW (ref >90)
GLUCOSE: 141 mg/dL — AB (ref 70–99)
Potassium: 4.8 mEq/L (ref 3.7–5.3)
SODIUM: 133 meq/L — AB (ref 137–147)

## 2014-10-16 MED ORDER — HEPARIN SOD (PORK) LOCK FLUSH 100 UNIT/ML IV SOLN
INTRAVENOUS | Status: AC
  Filled 2014-10-16: qty 5

## 2014-10-16 MED ORDER — SODIUM CHLORIDE 0.9 % IV SOLN
INTRAVENOUS | Status: DC
  Administered 2014-10-16: 13:00:00 via INTRAVENOUS

## 2014-10-16 MED ORDER — LIDOCAINE HCL 1 % IJ SOLN
INTRAMUSCULAR | Status: AC
  Filled 2014-10-16: qty 20

## 2014-10-16 MED ORDER — MIDAZOLAM HCL 2 MG/2ML IJ SOLN
INTRAMUSCULAR | Status: AC
  Filled 2014-10-16: qty 4

## 2014-10-16 MED ORDER — MIDAZOLAM HCL 2 MG/2ML IJ SOLN
INTRAMUSCULAR | Status: AC | PRN
  Administered 2014-10-16: 0.5 mg via INTRAVENOUS

## 2014-10-16 MED ORDER — FENTANYL CITRATE 0.05 MG/ML IJ SOLN
INTRAMUSCULAR | Status: AC | PRN
  Administered 2014-10-16 (×2): 25 ug via INTRAVENOUS

## 2014-10-16 MED ORDER — VANCOMYCIN HCL IN DEXTROSE 1-5 GM/200ML-% IV SOLN
1000.0000 mg | INTRAVENOUS | Status: AC
  Administered 2014-10-16: 1000 mg via INTRAVENOUS
  Filled 2014-10-16: qty 200

## 2014-10-16 MED ORDER — FENTANYL CITRATE 0.05 MG/ML IJ SOLN
INTRAMUSCULAR | Status: AC
  Filled 2014-10-16: qty 4

## 2014-10-16 NOTE — Procedures (Signed)
Procedure:  Port placement Access:  Right IJ vein Findings:  SL Power Port placed with tip at [employer].  OK to use.  No PTX.

## 2014-10-16 NOTE — H&P (Signed)
Chief Complaint: "I'm here to get a port a cath"  Referring Physician(s): Sherrill,Gary B  History of Present Illness: Debra [name] is a 73 y.o. female with history of metastatic pancreatic carcinoma who presents today for port a cath placement for chemotherapy.  Past Medical History  Diagnosis Date  . Type II diabetes mellitus     "just dx'd today' (12/08/2013)  . Hypertension   . Acute renal failure   . DKA (diabetic ketoacidoses)     Past Surgical History  Procedure Laterality Date  . Tubal ligation    . Dental surgery    . Eus N/A 09/28/2014    Procedure: UPPER ENDOSCOPIC ULTRASOUND (EUS) LINEAR;  Surgeon: Beryle Beams, MD;  Location: [facility];  Service: Endoscopy;  Laterality: N/A;    Allergies: Amoxicillin  Medications: Prior to Admission medications   Medication Sig Start Date End Date Taking? Authorizing Provider  ibuprofen (ADVIL,MOTRIN) 200 MG tablet Take 200 mg by mouth every 6 (six) hours as needed for mild pain.   Yes Historical Provider, MD  insulin detemir (LEVEMIR) 100 UNIT/ML injection Inject 6 Units into the skin at bedtime.   Yes Historical Provider, MD  lisinopril (PRINIVIL,ZESTRIL) 10 MG tablet Take 1 tablet (10 mg total) by mouth daily. Patient taking differently: Take 10 mg by mouth every morning.  12/12/13  Yes Eugenie Filler, MD  metoCLOPramide (REGLAN) 5 MG tablet Take 1 tablet (5 mg total) by mouth 3 (three) times daily before meals. 10/11/14  Yes Ladell Pier, MD  traMADol (ULTRAM) 50 MG tablet Take 1 tablet (50 mg total) by mouth every 6 (six) hours as needed for moderate pain. 10/11/14  Yes Ladell Pier, MD  acetaminophen (TYLENOL) 325 MG tablet Take 2 tablets (650 mg total) by mouth every 6 (six) hours as needed for mild pain. 09/29/14   Venetia Maxon Rama, MD  Hydrocodone-Acetaminophen (VICODIN) 5-300 MG TABS Take 1 tablet by mouth every 6 (six) hours as needed. Patient taking differently: Take 1 tablet by mouth every 6 (six) hours  as needed (for pain).  09/29/14   Venetia Maxon Rama, MD  Ibuprofen (ADVIL PO) Take 1 tablet by mouth daily as needed (stomach pain).    Historical Provider, MD  lidocaine-prilocaine (EMLA) cream Apply 1 application topically as needed. Apply to [facility] site 1-2 hours prior to stick and cover with plastic wrap 10/11/14   Ladell Pier, MD  Multiple Vitamin (MULTIVITAMIN WITH MINERALS) TABS tablet Take 1 tablet by mouth daily. 09/29/14   Christina P Rama, MD  ondansetron (ZOFRAN) 4 MG tablet Take 1 tablet (4 mg total) by mouth every 6 (six) hours as needed for nausea. 09/29/14   Venetia Maxon Rama, MD    History reviewed. No pertinent family history.  History   Social History  . Marital Status: Divorced    Spouse Name: N/A    Number of Children: 2  . Years of Education: N/A   Social History Main Topics  . Smoking status: Never Smoker   . Smokeless tobacco: Never Used  . Alcohol Use: No  . Drug Use: No  . Sexual Activity: No   Other Topics Concern  . None   Social History Narrative   Divorced, lives alone in [place], [place]   Retired from [employer] (30 years)   Works in [employer] now with [employer] program   Has #2 daughters, [name] and Lattie Haw         Review of Systems  Constitutional:  Positive for appetite change, fatigue and unexpected weight change. Negative for fever and chills.  Respiratory: Negative for cough and shortness of breath.   Cardiovascular: Negative for chest pain.  Gastrointestinal: Positive for nausea, abdominal pain and constipation. Negative for vomiting, diarrhea and blood in stool.  Genitourinary: Negative for dysuria and hematuria.  Musculoskeletal: Positive for back pain.  Neurological: Negative for headaches.    Vital Signs: BP 110/73 mmHg  Pulse 124  Temp(Src) 98 F (36.7 C) (Oral)  Resp 18  SpO2 100%  Physical Exam  Constitutional: She is oriented to person, place, and time.  Thin BF in NAD  Cardiovascular:  Regular rhythm.   tachy  Pulmonary/Chest: Effort normal and breath sounds normal.  Abdominal: Soft. Bowel sounds are normal. There is tenderness.  Musculoskeletal: Normal range of motion. She exhibits no edema.  Neurological: She is alert and oriented to person, place, and time.    Imaging: Ct Chest W Contrast  09/27/2014   CLINICAL DATA:  New diagnosis of pancreatic mass, epigastric fullness, 50 pound weight loss, personal history of type 2 diabetes mellitus, DKA, hypertension, acute renal failure  EXAM: CT CHEST WITH CONTRAST  TECHNIQUE: Multidetector CT imaging of the chest was performed during intravenous contrast administration. Sagittal and coronal MPR images reconstructed from axial data set.  CONTRAST:  70mL OMNIPAQUE IOHEXOL 300 MG/ML  SOLN  COMPARISON:  Chest radiograph 12/08/2013, CT abdomen and pelvis 09/24/2014  FINDINGS: Thoracic vascular structures patent on nondedicated exam.  Minimal pericardial effusion.  Calcified RIGHT hilar lymph nodes with calcified granulomata in RIGHT middle lobe and spleen.  Large pancreatic tail mass 9.9 x 6.5 cm image 51.  Multiple hepatic metastases up to 4.6 x 4.2 cm image 48.  Abdominal findings are better visualized on the recent CT abdomen.  Lungs otherwise clear.  No infiltrate, pleural effusion, pneumothorax or additional mass/ nodule.  Thrombosed splenic vein with LEFT upper quadrant collaterals.  No osseous metastases.  IMPRESSION: Old granulomatous disease.  No acute intra thoracic abnormalities.  Large pancreatic tail mass 9.9 x 6.5 cm with multiple hepatic metastases compatible with metastatic pancreatic neoplasm.   Electronically Signed   By: Lavonia Dana M.D.   On: 09/27/2014 14:32   Ct Abdomen Pelvis W Contrast  09/24/2014   CLINICAL DATA:  73 year old female with unexpected 75 pound weight loss over the past 10 months and progressively worsening epigastric pain over the past 3 months. Nausea, vomiting and constipation.  EXAM: CT ABDOMEN AND  PELVIS WITH CONTRAST  TECHNIQUE: Multidetector CT imaging of the abdomen and pelvis was performed using the standard protocol following bolus administration of intravenous contrast.  CONTRAST:  112mL OMNIPAQUE IOHEXOL 300 MG/ML  SOLN  COMPARISON:  No priors.  FINDINGS: Lower chest:  Calcified granulomas in near right middle lobe.  Hepatobiliary: Numerous hypovascular masses and nodules scattered throughout the liver, compatible with widespread metastatic disease. The largest of these is in segment 3 of the liver measuring 4.2 x 3.7 cm (image 23 of series 2). The largest lesion in the right lobe of the liver is in the periphery of segment 8 (image 13 of series 2) measuring 3.6 x 2.4 cm. Other smaller hepatic lesions are more well-defined and low-attenuation, likely cysts, including the largest of these which measures 1.9 cm in segment 2 of the liver. Gallbladder is unremarkable in appearance.  Pancreas: There is a large mass which demonstrates heterogeneous enhancement in the distal body and tail of the pancreas, which measures up to you 9.6 x 6.2  x 6.3 cm. Diffuse haziness in the peripancreatic fat may be related to lymphatic congestion, tumor infiltration and/or inflammation. This mass appears intimately associated with numerous vascular structures, discussed below.  Spleen: Numerous calcifications in the spleen, compatible with calcified granulomas.  Adrenals/Urinary Tract: The adrenal glands and kidneys are normal in appearance bilaterally. Notably, the patient appears to have a circumaortic left renal vein, with the anterior division of the vein likely occluded by the pancreatic mass.  Stomach/Bowel: The appearance of the stomach is normal. No pathologic dilatation of small bowel or colon. Numerous colonic diverticulae are noted, most evident in the region of the sigmoid colon, without surrounding inflammatory changes to suggest an acute diverticulitis at this time.  Vascular/Lymphatic: The previously described  pancreatic mass has occluded the splenic vein and proximal superior mesenteric vein as well as the splenoportal confluence. Reconstitution of the portal vein is noted just distal to the splenoportal confluence. There is cavernous transformation in the porta hepatis, and numerous splenoportal collateral vessels throughout the upper abdomen. As previously mentioned, the anterior division of a circumaortic left renal vein is occluded by the mass, while posterior division is widely patent. The superior mesenteric artery comes in close proximity to the mass, but does not appear to be clearly involved at this time (although there is poorly defined surrounding fat, which could suggest early tumor infiltration or edema). The distal celiac axis and left gastric appear intimately associated with the lesion, without evidence of occlusion at this time. The lesion does appear to contact the undersurface of both of these vessels, however. The left gonadal vein appears thrombosed, presumably from associated left renal vein involvement from the mid pancreatic mass.  Reproductive: Retroflexed fibroid uterus with multifocal calcified fibroids. Ovaries are atrophic.  Other: Trace volume of ascites in the cul-de-sac. No pneumoperitoneum.  Musculoskeletal: There are no aggressive appearing lytic or blastic lesions noted in the visualized portions of the skeleton.  IMPRESSION: 1. Large aggressive appearing mass in the distal body and tail of the pancreas measuring 9.6 x 6.2 x 6.3 cm with extensive local invasion, including vascular involvement of predominantly venous structures in the upper abdomen (as detailed above), as well as extensive metastatic disease to the liver. This is presumably a primary pancreatic adenocarcinoma. 2. Trace volume of ascites. 3. Additional incidental findings, as above. These results will be called to the ordering clinician or representative by the Radiologist Assistant, and communication documented in the  PACS or zVision Dashboard.   Electronically Signed   By: Vinnie Langton M.D.   On: 09/24/2014 14:47    Labs:  CBC:  Recent Labs  09/28/14 0540 09/29/14 0554 10/11/14 1226 10/16/14 1215  WBC 8.1 11.4* 9.8 12.4*  HGB 8.7* 9.1* 9.8* 8.7*  HCT 26.3* 27.1* 29.5* 26.6*  PLT 271 297 537* 459*    COAGS:  Recent Labs  09/26/14 1644 10/16/14 1215  INR 1.24 1.29  APTT 35 37    BMP:  Recent Labs  09/27/14 0440 09/28/14 0540 09/29/14 0554 10/11/14 1226 10/16/14 1215  NA 135* 133* 133* 134* 133*  K 3.8 4.1 3.6* 4.0 4.8  CL 96 97 96  --  94*  CO2 23 24 22 25 25   GLUCOSE 181* 154* 171* 113 141*  BUN 17 12 9  13.5 12  CALCIUM 9.3 9.2 9.2 10.5* 10.2  CREATININE 0.63 0.64 0.58 0.7 0.59  GFRNONAA 86* 85* 88*  --  87*  GFRAA >90 >90 >90  --  >90    LIVER FUNCTION TESTS:  Recent Labs  12/07/13 2027 09/26/14 1644 09/27/14 0440 10/11/14 1226  BILITOT <0.2* 0.4 0.3 0.36  AST 13 11 10 15   ALT 11 7 6 13   ALKPHOS 78 107 91 171*  PROT 7.4 8.2 6.8 8.0  ALBUMIN 2.9* 2.6* 2.2* 2.5*    TUMOR MARKERS:  Recent Labs  09/28/14 0540 10/11/14 1225  CA199 235.2* 204.8*    Assessment and Plan: Debra [name] is a 73 y.o. female with history of metastatic pancreatic carcinoma who presents today for port a cath placement for chemotherapy. Details/risks of procedure d/w pt/daughter with their understanding and consent.         Signed: Autumn Messing 10/16/2014, 1:17 PM

## 2014-10-16 NOTE — Discharge Instructions (Signed)
Implanted Port Insertion, Care After Refer to this sheet in the next few weeks. These instructions provide you with information on caring for yourself after your procedure. Your health care provider may also give you more specific instructions. Your treatment has been planned according to current medical practices, but problems sometimes occur. Call your health care provider if you have any problems or questions after your procedure. WHAT TO EXPECT AFTER THE PROCEDURE After your procedure, it is typical to have the following:   Discomfort at the port insertion site. Ice packs to the area will help.  Bruising on the skin over the port. This will subside in 3-4 days. HOME CARE INSTRUCTIONS  After your port is placed, you will get a manufacturer's information card. The card has information about your port. Keep this card with you at all times.   Know what kind of port you have. There are many types of ports available.   Wear a medical alert bracelet in case of an emergency. This can help alert health care workers that you have a port.   The port can stay in for as long as your health care provider believes it is necessary.   A home health care nurse may give medicines and take care of the port.   You or a family member can get special training and directions for giving medicine and taking care of the port at home.  SEEK MEDICAL CARE IF:   Your port does not flush or you are unable to get a blood return.   You have a fever or chills. SEEK IMMEDIATE MEDICAL CARE IF:  You have new fluid or pus coming from your incision.   You notice a bad smell coming from your incision site.   You have swelling, pain, or more redness at the incision or port site.   You have chest pain or shortness of breath. Document Released: 09/13/2013 Document Revised: 11/28/2013 Document Reviewed: 09/13/2013 ExitCare Patient Information 2015 [employer]. This information is not intended to replace  advice given to you by your health care provider. Make sure you discuss any questions you have with your health care provider.  

## 2014-10-17 ENCOUNTER — Other Ambulatory Visit (HOSPITAL_BASED_OUTPATIENT_CLINIC_OR_DEPARTMENT_OTHER): Payer: Medicare HMO

## 2014-10-17 ENCOUNTER — Other Ambulatory Visit: Payer: Medicare HMO

## 2014-10-17 ENCOUNTER — Ambulatory Visit (HOSPITAL_BASED_OUTPATIENT_CLINIC_OR_DEPARTMENT_OTHER): Payer: Medicare HMO | Admitting: Nurse Practitioner

## 2014-10-17 VITALS — BP 127/71 | HR 136 | Temp 98.0°F | Resp 19 | Ht 61.0 in | Wt 103.2 lb

## 2014-10-17 DIAGNOSIS — C258 Malignant neoplasm of overlapping sites of pancreas: Secondary | ICD-10-CM

## 2014-10-17 DIAGNOSIS — C252 Malignant neoplasm of tail of pancreas: Secondary | ICD-10-CM

## 2014-10-17 LAB — COMPREHENSIVE METABOLIC PANEL (CC13)
ALT: 12 U/L (ref 0–55)
AST: 16 U/L (ref 5–34)
Albumin: 2.1 g/dL — ABNORMAL LOW (ref 3.5–5.0)
Alkaline Phosphatase: 168 U/L — ABNORMAL HIGH (ref 40–150)
Anion Gap: 8 mEq/L (ref 3–11)
BILIRUBIN TOTAL: 0.33 mg/dL (ref 0.20–1.20)
BUN: 13.3 mg/dL (ref 7.0–26.0)
CALCIUM: 10 mg/dL (ref 8.4–10.4)
CHLORIDE: 98 meq/L (ref 98–109)
CO2: 25 meq/L (ref 22–29)
Creatinine: 0.8 mg/dL (ref 0.6–1.1)
GLUCOSE: 243 mg/dL — AB (ref 70–140)
Potassium: 4.3 mEq/L (ref 3.5–5.1)
SODIUM: 131 meq/L — AB (ref 136–145)
Total Protein: 7.1 g/dL (ref 6.4–8.3)

## 2014-10-17 NOTE — Progress Notes (Signed)
Debra [name] OFFICE PROGRESS NOTE   Diagnosis:  Pancreas cancer   INTERVAL HISTORY:   Debra [name] returns as scheduled. She underwent portacath placement on 10/16/2014. She notes some discomfort associated with the port. Abdominal pain is controlled with hydrocodone. Appetite is poor. She denies nausea. Bowels not moving on a regular basis. She is currently not taking a laxative.  Objective:  Vital signs in last 24 hours:  Blood pressure 127/71, pulse 136, temperature 98 F (36.7 C), temperature source Oral, resp. rate 19, height 5\' 1"  (1.549 m), weight 103 lb 3.2 oz (46.811 kg).repeat heart rate 108    Resp: lungs clear bilaterally. Cardio: regular, tachycardic. GI: fullness left upper abdomen. No hepatomegaly. Vascular: no leg edema. Port-A-Cath bandaged. Lab Results:  Lab Results  Component Value Date   WBC 12.4* 10/16/2014   HGB 8.7* 10/16/2014   HCT 26.6* 10/16/2014   MCV 84.2 10/16/2014   PLT 459* 10/16/2014   NEUTROABS 9.5* 10/16/2014    Imaging:  Ir Fluoro Guide Cv Line Right  10/16/2014   CLINICAL DATA:  Metastatic pancreatic carcinoma and need for porta cath to begin chemotherapy.  EXAM: IMPLANTED PORT A CATH PLACEMENT WITH ULTRASOUND AND FLUOROSCOPIC GUIDANCE  ANESTHESIA/SEDATION: 0.5 Mg IV Versed; 50 mcg IV Fentanyl  Total Moderate Sedation Time:  37 minutes  Additional Medications: 1 g IV vancomycin. Vancomycin was given within two hours of incision. Vancomycin was given due to an antibiotic allergy.  FLUOROSCOPY TIME:  42 seconds.  PROCEDURE: The procedure, risks, benefits, and alternatives were explained to the patient. Questions regarding the procedure were encouraged and answered. The patient understands and consents to the procedure.  The right neck and chest were prepped with chlorhexidine in a sterile fashion, and a sterile drape was applied covering the operative field. Maximum barrier sterile technique with sterile gowns and gloves were used  for the procedure. Local anesthesia was provided with 1% lidocaine. A time-out procedure was performed.  After creating a small venotomy incision, a 21 gauge needle was advanced into the right internal jugular vein under direct, real-time ultrasound guidance. Ultrasound image documentation was performed. After securing guidewire access, an 8 Fr dilator was placed. A J-wire was kinked to measure appropriate catheter length.  A subcutaneous port pocket was then created along the upper chest wall utilizing sharp and blunt dissection. Portable cautery was utilized. The pocket was irrigated with sterile saline.  A single lumen power injectable port was chosen for placement. The 8 Fr catheter was tunneled from the port pocket site to the venotomy incision. The port was placed in the pocket. External catheter was trimmed to appropriate length based on guidewire measurement.  At the venotomy, an 8 Fr peel-away sheath was placed over a guidewire. The catheter was then placed through the sheath and the sheath removed. Final catheter positioning was confirmed and documented with a fluoroscopic spot image. The port was accessed with a needle and aspirated and flushed with heparinized saline. The needle was removed.  The venotomy and port pocket incisions were closed with subcutaneous 3-0 Monocryl and subcuticular 4-0 Vicryl. Dermabond was applied to both incisions.  COMPLICATIONS: None  FINDINGS: After catheter placement, the tip lies at the cavoatrial junction. The catheter aspirates normally and is ready for immediate use.  IMPRESSION: Placement of single lumen port a cath via right internal jugular vein. The catheter tip lies at the cavoatrial junction. A power injectable port a cath was placed and is ready for immediate use.   Electronically Signed  By: Aletta Edouard M.D.   On: 10/16/2014 16:05   Ir US Guide Vasc Access Right  10/16/2014   CLINICAL DATA:  Metastatic pancreatic carcinoma and need for porta cath to  begin chemotherapy.  EXAM: IMPLANTED PORT A CATH PLACEMENT WITH ULTRASOUND AND FLUOROSCOPIC GUIDANCE  ANESTHESIA/SEDATION: 0.5 Mg IV Versed; 50 mcg IV Fentanyl  Total Moderate Sedation Time:  37 minutes  Additional Medications: 1 g IV vancomycin. Vancomycin was given within two hours of incision. Vancomycin was given due to an antibiotic allergy.  FLUOROSCOPY TIME:  42 seconds.  PROCEDURE: The procedure, risks, benefits, and alternatives were explained to the patient. Questions regarding the procedure were encouraged and answered. The patient understands and consents to the procedure.  The right neck and chest were prepped with chlorhexidine in a sterile fashion, and a sterile drape was applied covering the operative field. Maximum barrier sterile technique with sterile gowns and gloves were used for the procedure. Local anesthesia was provided with 1% lidocaine. A time-out procedure was performed.  After creating a small venotomy incision, a 21 gauge needle was advanced into the right internal jugular vein under direct, real-time ultrasound guidance. Ultrasound image documentation was performed. After securing guidewire access, an 8 Fr dilator was placed. A J-wire was kinked to measure appropriate catheter length.  A subcutaneous port pocket was then created along the upper chest wall utilizing sharp and blunt dissection. Portable cautery was utilized. The pocket was irrigated with sterile saline.  A single lumen power injectable port was chosen for placement. The 8 Fr catheter was tunneled from the port pocket site to the venotomy incision. The port was placed in the pocket. External catheter was trimmed to appropriate length based on guidewire measurement.  At the venotomy, an 8 Fr peel-away sheath was placed over a guidewire. The catheter was then placed through the sheath and the sheath removed. Final catheter positioning was confirmed and documented with a fluoroscopic spot image. The port was accessed with a  needle and aspirated and flushed with heparinized saline. The needle was removed.  The venotomy and port pocket incisions were closed with subcutaneous 3-0 Monocryl and subcuticular 4-0 Vicryl. Dermabond was applied to both incisions.  COMPLICATIONS: None  FINDINGS: After catheter placement, the tip lies at the cavoatrial junction. The catheter aspirates normally and is ready for immediate use.  IMPRESSION: Placement of single lumen port a cath via right internal jugular vein. The catheter tip lies at the cavoatrial junction. A power injectable port a cath was placed and is ready for immediate use.   Electronically Signed   By: Aletta Edouard M.D.   On: 10/16/2014 16:05    Medications: I have reviewed the patient's current medications.  Assessment/Plan: 1. 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 Pain secondary to #1 3. Nausea secondary to #1 4. Diabetes 5. Anorexia/weight loss 6. Port-A-Cath placement 10/16/2014   Disposition: Debra [name] appears stable. She has a borderline performance status. She has attended the chemotherapy education class. Potential toxicities were again reviewed at today's visit. She is scheduled for cycle 1 gemcitabine/Abraxane on 10/18/2014. She will return for a followup visit on 10/31/2014. She or her family will contact the office in the interim with any problems.  Plan reviewed with Dr. Benay Spice.    Debra [name] ANP/GNP-BC   10/17/2014  9:36 AM

## 2014-10-18 ENCOUNTER — Telehealth: Payer: Self-pay | Admitting: Nurse Practitioner

## 2014-10-18 ENCOUNTER — Other Ambulatory Visit: Payer: Self-pay | Admitting: Nurse Practitioner

## 2014-10-18 ENCOUNTER — Ambulatory Visit: Payer: Medicare HMO | Admitting: Nutrition

## 2014-10-18 ENCOUNTER — Ambulatory Visit (HOSPITAL_BASED_OUTPATIENT_CLINIC_OR_DEPARTMENT_OTHER): Payer: Medicare HMO

## 2014-10-18 ENCOUNTER — Other Ambulatory Visit: Payer: Self-pay

## 2014-10-18 VITALS — BP 140/73 | HR 117 | Temp 97.9°F | Resp 18

## 2014-10-18 DIAGNOSIS — R634 Abnormal weight loss: Secondary | ICD-10-CM

## 2014-10-18 DIAGNOSIS — C252 Malignant neoplasm of tail of pancreas: Secondary | ICD-10-CM

## 2014-10-18 DIAGNOSIS — R630 Anorexia: Secondary | ICD-10-CM

## 2014-10-18 DIAGNOSIS — R110 Nausea: Secondary | ICD-10-CM

## 2014-10-18 MED ORDER — SODIUM CHLORIDE 0.9 % IV SOLN
Freq: Once | INTRAVENOUS | Status: AC
  Administered 2014-10-18: 14:00:00 via INTRAVENOUS

## 2014-10-18 NOTE — Progress Notes (Signed)
[place], RN, Dr. Gearldine Shown nurse about pt's heart rate of 132. 140 apicaly. Tonya notified Lisa,NP who ordered an EKG for patient. New orders to hold treatment and give 500cc NS, recheck VS and pt to return next week for possible treatment. Pt and her daughters with her and verbalize understanding and agree to plan of care

## 2014-10-18 NOTE — Telephone Encounter (Signed)
, °

## 2014-10-18 NOTE — Patient Instructions (Signed)
Dehydration, Adult Dehydration is when you lose more fluids from the body than you take in. Vital organs like the kidneys, brain, and heart cannot function without a proper amount of fluids and salt. Any loss of fluids from the body can cause dehydration.  CAUSES   Vomiting.  Diarrhea.  Excessive sweating.  Excessive urine output.  Fever. SYMPTOMS  Mild dehydration  Thirst.  Dry lips.  Slightly dry mouth. Moderate dehydration  Very dry mouth.  Sunken eyes.  Skin does not bounce back quickly when lightly pinched and released.  Dark urine and decreased urine production.  Decreased tear production.  Headache. Severe dehydration  Very dry mouth.  Extreme thirst.  Rapid, weak pulse (more than 100 beats per minute at rest).  Cold hands and feet.  Not able to sweat in spite of heat and temperature.  Rapid breathing.  Blue lips.  Confusion and lethargy.  Difficulty being awakened.  Minimal urine production.  No tears. DIAGNOSIS  Your caregiver will diagnose dehydration based on your symptoms and your exam. Blood and urine tests will help confirm the diagnosis. The diagnostic evaluation should also identify the cause of dehydration. TREATMENT  Treatment of mild or moderate dehydration can often be done at home by increasing the amount of fluids that you drink. It is best to drink small amounts of fluid more often. Drinking too much at one time can make vomiting worse. Refer to the home care instructions below. Severe dehydration needs to be treated at the hospital where you will probably be given intravenous (IV) fluids that contain water and electrolytes. HOME CARE INSTRUCTIONS   Ask your caregiver about specific rehydration instructions.  Drink enough fluids to keep your urine clear or pale yellow.  Drink small amounts frequently if you have nausea and vomiting.  Eat as you normally do.  Avoid:  Foods or drinks high in sugar.  Carbonated  drinks.  Juice.  Extremely hot or cold fluids.  Drinks with caffeine.  Fatty, greasy foods.  Alcohol.  Tobacco.  Overeating.  Gelatin desserts.  Wash your hands well to avoid spreading bacteria and viruses.  Only take over-the-counter or prescription medicines for pain, discomfort, or fever as directed by your caregiver.  Ask your caregiver if you should continue all prescribed and over-the-counter medicines.  Keep all follow-up appointments with your caregiver. SEEK MEDICAL CARE IF:  You have abdominal pain and it increases or stays in one area (localizes).  You have a rash, stiff neck, or severe headache.  You are irritable, sleepy, or difficult to awaken.  You are weak, dizzy, or extremely thirsty. SEEK IMMEDIATE MEDICAL CARE IF:   You are unable to keep fluids down or you get worse despite treatment.  You have frequent episodes of vomiting or diarrhea.  You have blood or green matter (bile) in your vomit.  You have blood in your stool or your stool looks black and tarry.  You have not urinated in 6 to 8 hours, or you have only urinated a small amount of very dark urine.  You have a fever.  You faint. MAKE SURE YOU:   Understand these instructions.  Will watch your condition.  Will get help right away if you are not doing well or get worse. Document Released: 11/23/2005 Document Revised: 02/15/2012 Document Reviewed: 07/13/2011 Exit[employer] Patient Information 2015 [employer]. This information is not intended to replace advice given to you by your health care provider. Make sure you discuss any questions you have with your health care   provider.  

## 2014-10-18 NOTE — Progress Notes (Signed)
73 year old female diagnosed with cancer of the pancreas.  She is a patient of Dr. Benay Spice.  Past medical history includes diabetes, hypertension, and DKA.  Medications include Reglan, multivitamin, Zofran.  Labs include sodium 131, glucose 243, albumin 2.1 on November 11.  Height: 61 inches. Weight: 103.2 pounds November 11. Usual body weight: 168 pounds 12/08/2013. BMI: 19.51.  Patient was diagnosed with diabetes, in January 2015.  She has since lost approximately 65 pounds.  She reports nausea with occasional emesis.  She complains of constipation.  She tries to drink boost but sometimes has vomiting after consuming. She has a poor appetite.  Nutrition diagnosis: Unintended weight loss related to inadequate oral intake as evidenced by 39% weight loss over 11 months.  Patient meets criteria for severe malnutrition in the context of chronic illness secondary to greater than 20% weight loss over past year, less than 75% energy intake for greater than one month, and severe depletion of both body fat and muscle mass on physical exam.  Intervention: Patient educated to consume small, frequent snacks and meals throughout the day consisting of high-calorie, high-protein foods. Patient educated on strategies for improving nausea and vomiting.  Encouraged patient comply with medications.  Fact sheets were provided. Educated patient on the importance of consuming oral nutrition supplements or smoothies as tolerated for additional calories and protein.  Recipes provided. Questions were answered.  Teach back method used.  Contact information was given.  Monitoring, evaluation, goals: Patient will tolerate adequate calories and protein to minimize further weight loss.  Next visit:Has not been scheduled.  Patient is contact information for questions or concerns.  **Disclaimer: This note was dictated with voice recognition software. Similar sounding words can inadvertently be transcribed and this  note may contain transcription errors which may not have been corrected upon publication of note.**

## 2014-10-19 ENCOUNTER — Telehealth: Payer: Self-pay | Admitting: *Deleted

## 2014-10-19 NOTE — Telephone Encounter (Signed)
Per staff message and POF I have scheduled appts. Advised scheduler of appts. JMW  

## 2014-10-23 ENCOUNTER — Ambulatory Visit (HOSPITAL_BASED_OUTPATIENT_CLINIC_OR_DEPARTMENT_OTHER): Payer: Medicare HMO | Admitting: Nurse Practitioner

## 2014-10-23 ENCOUNTER — Ambulatory Visit: Payer: Medicare HMO

## 2014-10-23 ENCOUNTER — Telehealth: Payer: Self-pay | Admitting: Nurse Practitioner

## 2014-10-23 ENCOUNTER — Telehealth: Payer: Self-pay | Admitting: *Deleted

## 2014-10-23 ENCOUNTER — Other Ambulatory Visit (HOSPITAL_BASED_OUTPATIENT_CLINIC_OR_DEPARTMENT_OTHER): Payer: Medicare HMO

## 2014-10-23 VITALS — BP 144/89 | HR 144 | Temp 97.7°F | Resp 21 | Ht 61.0 in | Wt 99.8 lb

## 2014-10-23 DIAGNOSIS — D649 Anemia, unspecified: Secondary | ICD-10-CM

## 2014-10-23 DIAGNOSIS — R634 Abnormal weight loss: Secondary | ICD-10-CM

## 2014-10-23 DIAGNOSIS — R000 Tachycardia, unspecified: Secondary | ICD-10-CM

## 2014-10-23 DIAGNOSIS — C252 Malignant neoplasm of tail of pancreas: Secondary | ICD-10-CM

## 2014-10-23 DIAGNOSIS — G893 Neoplasm related pain (acute) (chronic): Secondary | ICD-10-CM

## 2014-10-23 DIAGNOSIS — C787 Secondary malignant neoplasm of liver and intrahepatic bile duct: Secondary | ICD-10-CM

## 2014-10-23 DIAGNOSIS — R630 Anorexia: Secondary | ICD-10-CM

## 2014-10-23 DIAGNOSIS — R110 Nausea: Secondary | ICD-10-CM

## 2014-10-23 DIAGNOSIS — K5900 Constipation, unspecified: Secondary | ICD-10-CM

## 2014-10-23 LAB — COMPREHENSIVE METABOLIC PANEL (CC13)
ALBUMIN: 2.4 g/dL — AB (ref 3.5–5.0)
ALT: 9 U/L (ref 0–55)
AST: 12 U/L (ref 5–34)
Alkaline Phosphatase: 205 U/L — ABNORMAL HIGH (ref 40–150)
Anion Gap: 11 mEq/L (ref 3–11)
BUN: 16.7 mg/dL (ref 7.0–26.0)
CALCIUM: 10.4 mg/dL (ref 8.4–10.4)
CHLORIDE: 98 meq/L (ref 98–109)
CO2: 24 mEq/L (ref 22–29)
CREATININE: 0.8 mg/dL (ref 0.6–1.1)
Glucose: 245 mg/dl — ABNORMAL HIGH (ref 70–140)
POTASSIUM: 4.4 meq/L (ref 3.5–5.1)
Sodium: 132 mEq/L — ABNORMAL LOW (ref 136–145)
Total Bilirubin: 0.44 mg/dL (ref 0.20–1.20)
Total Protein: 7.7 g/dL (ref 6.4–8.3)

## 2014-10-23 LAB — CBC WITH DIFFERENTIAL/PLATELET
BASO%: 0.2 % (ref 0.0–2.0)
Basophils Absolute: 0 10*3/uL (ref 0.0–0.1)
EOS%: 0.4 % (ref 0.0–7.0)
Eosinophils Absolute: 0.1 10*3/uL (ref 0.0–0.5)
HCT: 28.6 % — ABNORMAL LOW (ref 34.8–46.6)
HGB: 9.4 g/dL — ABNORMAL LOW (ref 11.6–15.9)
LYMPH%: 9.6 % — ABNORMAL LOW (ref 14.0–49.7)
MCH: 27.2 pg (ref 25.1–34.0)
MCHC: 32.9 g/dL (ref 31.5–36.0)
MCV: 82.9 fL (ref 79.5–101.0)
MONO#: 1.1 10*3/uL — AB (ref 0.1–0.9)
MONO%: 6.7 % (ref 0.0–14.0)
NEUT#: 13.8 10*3/uL — ABNORMAL HIGH (ref 1.5–6.5)
NEUT%: 83.1 % — ABNORMAL HIGH (ref 38.4–76.8)
Platelets: 487 10*3/uL — ABNORMAL HIGH (ref 145–400)
RBC: 3.45 10*6/uL — AB (ref 3.70–5.45)
RDW: 14 % (ref 11.2–14.5)
WBC: 16.6 10*3/uL — AB (ref 3.9–10.3)
lymph#: 1.6 10*3/uL (ref 0.9–3.3)

## 2014-10-23 LAB — HOLD TUBE, BLOOD BANK

## 2014-10-23 MED ORDER — SORBITOL 70 % PO SOLN: 30.0000 mL | Freq: Two times a day (BID) | ORAL | Status: DC

## 2014-10-23 NOTE — Patient Instructions (Signed)
Continue miralax daily Begin senokot S 2 tabs twice daily Begin sorbitol twice daily Give a fleets enema today; ok to repeat tomorrow if no results

## 2014-10-23 NOTE — Telephone Encounter (Signed)
Per staff message and POF I have scheduled appts. Advised scheduler of appts. JMW  

## 2014-10-23 NOTE — Progress Notes (Addendum)
  Debra [name] OFFICE PROGRESS NOTE   Diagnosis:  Pancreas cancer  INTERVAL HISTORY:   Debra [name] returns as scheduled. Chemotherapy was held on 10/18/2014 due to significant tachycardia. The tachycardia was felt to be multifactorial secondary to deconditioning, anemia, question dehydration. She received IV fluids.  Her daughter reports that her overall condition improved following the IV fluids. However, she subsequently developed constipation. She has not had a bowel movement in one week. She is having significant pain at the rectum. She feels as if stool is present in the rectal vault. She intermittently notes leakage of stool. She has tried MiraLAX, suppositories and prune juice with minimal results.   She reports abdominal pain is better. She estimates taking Vicodin 1-2 times a day.  Objective:  Vital signs in last 24 hours:  Blood pressure 144/89, pulse 144, temperature 97.7 F (36.5 C), temperature source Oral, resp. rate 21, height 5\' 1"  (1.549 m), weight 99 lb 12.8 oz (45.269 kg), SpO2 100 %.    Resp: lungs are clear. Cardio: heart is regular, tachycardic. GI: abdomen is nontender. Fullness left upper abdomen. No hepatomegaly. Bowel sounds active. Vascular: no leg edema. Rectal: stool is present in the rectal vault. She tolerated the rectal exam poorly.   Lab Results:  Lab Results  Component Value Date   WBC 16.6* 10/23/2014   HGB 9.4* 10/23/2014   HCT 28.6* 10/23/2014   MCV 82.9 10/23/2014   PLT 487* 10/23/2014   NEUTROABS 13.8* 10/23/2014    Imaging:  No results found.  Medications: I have reviewed the patient's current medications.  Assessment/Plan: 1. Metastatic adenocarcinoma the pancreas, pancreas body/tail mass with extensive liver metastases  EUS biopsy 09/28/2014 confirmed adenocarcinoma 2. Pain secondary to #1 3. Nausea secondary to #1 4. Diabetes 5. Anorexia/weight loss 6. Port-A-Cath placement  10/16/2014 7. Constipation 8. Tachycardia, multifactorial secondary to deconditioning, anemia, cancer.   Disposition: Debra [name] continues to have a poor performance status. We are holding today's scheduled chemotherapy.   She has been constipated and on exam is impacted. We discussed manual disimpaction versus an aggressive bowel regimen with laxatives and enemas. She would like to try the bowel regimen initially. We gave her instructions to continue MiraLAX daily, begin Senokot-S 2 tablets twice daily, begin sorbitol 30 cc twice daily and to administer a fleets enema. She will contact the office in the morning with an update.  The tachycardia is most likely due to deconditioning, anemia, cancer.  She is scheduled to return for a followup visit and possible chemotherapy on 10/31/2014. She requested we adjust this appointment 11/02/2014 due to the Thanksgiving holiday.  Patient seen with Dr. Benay Spice. 25 minutes were spent face-to-face at today's visit with the majority of the time involved in counseling/coordination of care.    Ned Card ANP/GNP-BC   10/23/2014  12:37 PM   This was a sheared visit with Ned Card.  Debra [name] has a fecal impaction. She will try enemas and a laxative regimen. If this is not successful she will be referred for manual disimpaction.  Plans for chemotherapy remain on hold. She has persistent tachycardia, likely related to anemia, deconditioning, and the metastatic tumor burden. Her daughter reports Debra [name] has an adequate oral intake.  She will return for an office visit on 11/02/2014.  Julieanne Manson, MD

## 2014-10-23 NOTE — Telephone Encounter (Signed)
Gave avs & cal for Nov. Sent mess to r/s tx.

## 2014-10-25 ENCOUNTER — Telehealth: Payer: Self-pay | Admitting: *Deleted

## 2014-10-25 NOTE — Telephone Encounter (Signed)
Received phone call from patient's daughter(Allison) stating that patient was able to have a BM after two enemas.  Informed Elby Showers. Marcello Moores, NP.

## 2014-10-31 ENCOUNTER — Other Ambulatory Visit: Payer: Medicare HMO

## 2014-10-31 ENCOUNTER — Ambulatory Visit: Payer: Medicare HMO | Admitting: Oncology

## 2014-10-31 ENCOUNTER — Ambulatory Visit: Payer: Medicare HMO

## 2014-11-02 ENCOUNTER — Ambulatory Visit (HOSPITAL_BASED_OUTPATIENT_CLINIC_OR_DEPARTMENT_OTHER): Payer: Medicare HMO

## 2014-11-02 ENCOUNTER — Ambulatory Visit (HOSPITAL_BASED_OUTPATIENT_CLINIC_OR_DEPARTMENT_OTHER): Payer: Medicare HMO | Admitting: Nurse Practitioner

## 2014-11-02 ENCOUNTER — Other Ambulatory Visit (HOSPITAL_BASED_OUTPATIENT_CLINIC_OR_DEPARTMENT_OTHER): Payer: Medicare HMO

## 2014-11-02 ENCOUNTER — Telehealth: Payer: Self-pay | Admitting: Oncology

## 2014-11-02 ENCOUNTER — Other Ambulatory Visit: Payer: Self-pay | Admitting: Nurse Practitioner

## 2014-11-02 DIAGNOSIS — R634 Abnormal weight loss: Secondary | ICD-10-CM

## 2014-11-02 DIAGNOSIS — C252 Malignant neoplasm of tail of pancreas: Secondary | ICD-10-CM

## 2014-11-02 DIAGNOSIS — Z5111 Encounter for antineoplastic chemotherapy: Secondary | ICD-10-CM

## 2014-11-02 DIAGNOSIS — R630 Anorexia: Secondary | ICD-10-CM

## 2014-11-02 DIAGNOSIS — K5900 Constipation, unspecified: Secondary | ICD-10-CM

## 2014-11-02 DIAGNOSIS — R110 Nausea: Secondary | ICD-10-CM

## 2014-11-02 DIAGNOSIS — D649 Anemia, unspecified: Secondary | ICD-10-CM

## 2014-11-02 DIAGNOSIS — C787 Secondary malignant neoplasm of liver and intrahepatic bile duct: Secondary | ICD-10-CM

## 2014-11-02 DIAGNOSIS — R000 Tachycardia, unspecified: Secondary | ICD-10-CM

## 2014-11-02 DIAGNOSIS — G893 Neoplasm related pain (acute) (chronic): Secondary | ICD-10-CM

## 2014-11-02 LAB — COMPREHENSIVE METABOLIC PANEL (CC13)
ALBUMIN: 2.2 g/dL — AB (ref 3.5–5.0)
ALK PHOS: 289 U/L — AB (ref 40–150)
ALT: 15 U/L (ref 0–55)
AST: 17 U/L (ref 5–34)
Anion Gap: 11 mEq/L (ref 3–11)
BUN: 19.4 mg/dL (ref 7.0–26.0)
CO2: 25 mEq/L (ref 22–29)
Calcium: 10 mg/dL (ref 8.4–10.4)
Chloride: 97 mEq/L — ABNORMAL LOW (ref 98–109)
Creatinine: 0.8 mg/dL (ref 0.6–1.1)
Glucose: 372 mg/dl — ABNORMAL HIGH (ref 70–140)
POTASSIUM: 4.1 meq/L (ref 3.5–5.1)
SODIUM: 134 meq/L — AB (ref 136–145)
TOTAL PROTEIN: 7.3 g/dL (ref 6.4–8.3)
Total Bilirubin: 0.32 mg/dL (ref 0.20–1.20)

## 2014-11-02 LAB — CBC WITH DIFFERENTIAL/PLATELET
BASO%: 0.2 % (ref 0.0–2.0)
Basophils Absolute: 0 10*3/uL (ref 0.0–0.1)
EOS%: 1.1 % (ref 0.0–7.0)
Eosinophils Absolute: 0.2 10*3/uL (ref 0.0–0.5)
HCT: 25.5 % — ABNORMAL LOW (ref 34.8–46.6)
HGB: 8.3 g/dL — ABNORMAL LOW (ref 11.6–15.9)
LYMPH%: 7 % — ABNORMAL LOW (ref 14.0–49.7)
MCH: 26.9 pg (ref 25.1–34.0)
MCHC: 32.5 g/dL (ref 31.5–36.0)
MCV: 82.8 fL (ref 79.5–101.0)
MONO#: 1.4 10*3/uL — ABNORMAL HIGH (ref 0.1–0.9)
MONO%: 8.7 % (ref 0.0–14.0)
NEUT%: 83 % — ABNORMAL HIGH (ref 38.4–76.8)
NEUTROS ABS: 13.5 10*3/uL — AB (ref 1.5–6.5)
Platelets: 473 10*3/uL — ABNORMAL HIGH (ref 145–400)
RBC: 3.08 10*6/uL — AB (ref 3.70–5.45)
RDW: 14.6 % — AB (ref 11.2–14.5)
WBC: 16.2 10*3/uL — ABNORMAL HIGH (ref 3.9–10.3)
lymph#: 1.1 10*3/uL (ref 0.9–3.3)

## 2014-11-02 MED ORDER — ONDANSETRON 8 MG/50ML IVPB (CHCC)
8.0000 mg | Freq: Once | INTRAVENOUS | Status: AC
  Administered 2014-11-02: 8 mg via INTRAVENOUS

## 2014-11-02 MED ORDER — SODIUM CHLORIDE 0.9 % IV SOLN
Freq: Once | INTRAVENOUS | Status: AC
  Administered 2014-11-02: 14:00:00 via INTRAVENOUS

## 2014-11-02 MED ORDER — SODIUM CHLORIDE 0.9 % IV SOLN
800.0000 mg/m2 | Freq: Once | INTRAVENOUS | Status: AC
  Administered 2014-11-02: 1140 mg via INTRAVENOUS
  Filled 2014-11-02: qty 29.98

## 2014-11-02 MED ORDER — SODIUM CHLORIDE 0.9 % IJ SOLN
10.0000 mL | INTRAMUSCULAR | Status: DC | PRN
  Administered 2014-11-02: 10 mL
  Filled 2014-11-02: qty 10

## 2014-11-02 MED ORDER — ONDANSETRON 8 MG/NS 50 ML IVPB
INTRAVENOUS | Status: AC
  Filled 2014-11-02: qty 8

## 2014-11-02 MED ORDER — HYDROCODONE-ACETAMINOPHEN 5-325 MG PO TABS
ORAL_TABLET | ORAL | Status: AC
  Filled 2014-11-02: qty 1

## 2014-11-02 MED ORDER — HEPARIN SOD (PORK) LOCK FLUSH 100 UNIT/ML IV SOLN
500.0000 [IU] | Freq: Once | INTRAVENOUS | Status: AC | PRN
  Administered 2014-11-02: 500 [IU]
  Filled 2014-11-02: qty 5

## 2014-11-02 MED ORDER — HYDROCODONE-ACETAMINOPHEN 5-325 MG PO TABS
1.0000 | ORAL_TABLET | Freq: Once | ORAL | Status: AC
  Administered 2014-11-02: 1 via ORAL

## 2014-11-02 NOTE — Patient Instructions (Addendum)
[facility] Discharge Instructions for Patients Receiving Chemotherapy  Today you received the following chemotherapy agents: Gemzar  To help prevent nausea and vomiting after your treatment, we encourage you to take your nausea medication as prescribed by your physician.   If you develop nausea and vomiting that is not controlled by your nausea medication, call the clinic.   BELOW ARE SYMPTOMS THAT SHOULD BE REPORTED IMMEDIATELY:  *FEVER GREATER THAN 100.5 F  *CHILLS WITH OR WITHOUT FEVER  NAUSEA AND VOMITING THAT IS NOT CONTROLLED WITH YOUR NAUSEA MEDICATION  *UNUSUAL SHORTNESS OF BREATH  *UNUSUAL BRUISING OR BLEEDING  TENDERNESS IN MOUTH AND THROAT WITH OR WITHOUT PRESENCE OF ULCERS  *URINARY PROBLEMS  *BOWEL PROBLEMS  UNUSUAL RASH Items with * indicate a potential emergency and should be followed up as soon as possible.  Feel free to call the clinic you have any questions or concerns. The clinic phone number is [phone].    

## 2014-11-02 NOTE — Progress Notes (Signed)
1415- Patient c/o left abd pain, 8/10. Per Dr. Benay Spice, okay to give vicodin 5/325 po x 1 dose while in infusion.

## 2014-11-02 NOTE — Telephone Encounter (Signed)
gv and printed appt sched for DEC...sed added tx

## 2014-11-02 NOTE — Progress Notes (Addendum)
  [place] OFFICE PROGRESS NOTE   Diagnosis:  Pancreas cancer   INTERVAL HISTORY:   Debra [name] returns as scheduled. She reports the fecal impaction was relieved with several enemas. Bowels now moving regularly. Appetite continues to be poor. She feels fluid intake is adequate. She continues to have abdominal pain. She gets partial relief with Tylenol and Advil. She has occasional nausea/vomiting.  Objective:  Vital signs in last 24 hours:  Blood pressure 138/77, pulse 130, temperature 98.4 F (36.9 C), temperature source Oral, resp. rate 20, height 5\' 1"  (1.549 m), weight 96 lb 6.4 oz (43.727 kg), SpO2 100 %.    HEENT: no thrush or ulcers. Resp: lungs are clear. Cardio: heart regular, tachycardia. GI: hepatomegaly versus abdominal mass right upper medial abdomen. Vascular: no leg edema. Port-A-Cath without erythema.   Lab Results:  Lab Results  Component Value Date   WBC 16.2* 11/02/2014   HGB 8.3* 11/02/2014   HCT 25.5* 11/02/2014   MCV 82.8 11/02/2014   PLT 473* 11/02/2014   NEUTROABS 13.5* 11/02/2014    Imaging:  No results found.  Medications: I have reviewed the patient's current medications.  Assessment/Plan: 1. Metastatic adenocarcinoma the pancreas, pancreas body/tail mass with extensive liver metastases  EUS biopsy 09/28/2014 confirmed adenocarcinoma 2. Pain secondary to #1 3. Nausea secondary to #1 4. Diabetes 5. Anorexia/weight loss 6. Port-A-Cath placement 10/16/2014 7. Constipation 8. Tachycardia, multifactorial secondary to deconditioning, anemia, metastatic tumor burden. 9. Fecal impaction 10/23/2014. Relieved with enemas.   Disposition: Debra [name] continues to have a poor performance status. We discussed proceeding with chemotherapy versus a supportive care approach. She would like to try the chemotherapy. Due to the poor performance status Dr. Benay Spice recommends gemcitabine alone with today's treatment. We will reevaluate  her overall condition/tolerance of the chemotherapy in 2 weeks and decide on continuing with gemcitabine versus gemcitabine/Abraxane. She and her daughter are in agreement with this plan.  She will return for a followup visit on 11/16/2014.  Patient seen with Dr. Benay Spice. 25 minutes were spent face-to-face at today's visit with the majority that time involved in counseling/coordination of care.    Ned Card ANP/GNP-BC   11/02/2014  12:19 PM  This was a shared visit with Ned Card. Debra [name] was interviewed and examined. She continues to have a borderline performance status to receive chemotherapy. We discussed the potential increased toxicity from chemotherapy with Debra [name] and her daughter.  She would like to proceed with a trial of chemotherapy. We will give single agent gemcitabine with cycle one today. She will return for an office visit in 2 weeks. We will add Abraxane if her performance status improves.  Julieanne Manson, M.D.

## 2014-11-05 ENCOUNTER — Encounter (HOSPITAL_COMMUNITY): Payer: Self-pay | Admitting: *Deleted

## 2014-11-05 ENCOUNTER — Other Ambulatory Visit: Payer: Self-pay

## 2014-11-05 ENCOUNTER — Telehealth: Payer: Self-pay | Admitting: *Deleted

## 2014-11-05 ENCOUNTER — Emergency Department (HOSPITAL_COMMUNITY): Payer: Medicare HMO

## 2014-11-05 ENCOUNTER — Inpatient Hospital Stay (HOSPITAL_COMMUNITY)
Admission: EM | Admit: 2014-11-05 | Discharge: 2014-11-08 | DRG: 917 | Disposition: A | Discharge status: Hospice | Payer: Medicare HMO | Attending: Internal Medicine | Admitting: Internal Medicine

## 2014-11-05 DIAGNOSIS — Z66 Do not resuscitate: Secondary | ICD-10-CM

## 2014-11-05 DIAGNOSIS — R112 Nausea with vomiting, unspecified: Secondary | ICD-10-CM | POA: Diagnosis not present

## 2014-11-05 DIAGNOSIS — C259 Malignant neoplasm of pancreas, unspecified: Secondary | ICD-10-CM | POA: Diagnosis present

## 2014-11-05 DIAGNOSIS — Y92002 Bathroom of unspecified non-institutional (private) residence single-family (private) house as the place of occurrence of the external cause: Secondary | ICD-10-CM

## 2014-11-05 DIAGNOSIS — E1165 Type 2 diabetes mellitus with hyperglycemia: Secondary | ICD-10-CM | POA: Diagnosis present

## 2014-11-05 DIAGNOSIS — E118 Type 2 diabetes mellitus with unspecified complications: Secondary | ICD-10-CM

## 2014-11-05 DIAGNOSIS — E43 Unspecified severe protein-calorie malnutrition: Secondary | ICD-10-CM | POA: Diagnosis present

## 2014-11-05 DIAGNOSIS — C252 Malignant neoplasm of tail of pancreas: Secondary | ICD-10-CM | POA: Diagnosis present

## 2014-11-05 DIAGNOSIS — W19XXXA Unspecified fall, initial encounter: Secondary | ICD-10-CM | POA: Diagnosis present

## 2014-11-05 DIAGNOSIS — T451X5A Adverse effect of antineoplastic and immunosuppressive drugs, initial encounter: Secondary | ICD-10-CM | POA: Diagnosis not present

## 2014-11-05 DIAGNOSIS — D638 Anemia in other chronic diseases classified elsewhere: Secondary | ICD-10-CM | POA: Diagnosis present

## 2014-11-05 DIAGNOSIS — Z79899 Other long term (current) drug therapy: Secondary | ICD-10-CM

## 2014-11-05 DIAGNOSIS — Z794 Long term (current) use of insulin: Secondary | ICD-10-CM

## 2014-11-05 DIAGNOSIS — S0990XA Unspecified injury of head, initial encounter: Secondary | ICD-10-CM | POA: Diagnosis present

## 2014-11-05 DIAGNOSIS — I10 Essential (primary) hypertension: Secondary | ICD-10-CM | POA: Diagnosis present

## 2014-11-05 DIAGNOSIS — R630 Anorexia: Secondary | ICD-10-CM | POA: Diagnosis present

## 2014-11-05 DIAGNOSIS — IMO0002 Reserved for concepts with insufficient information to code with codable children: Secondary | ICD-10-CM | POA: Diagnosis present

## 2014-11-05 DIAGNOSIS — R55 Syncope and collapse: Secondary | ICD-10-CM

## 2014-11-05 DIAGNOSIS — E860 Dehydration: Secondary | ICD-10-CM | POA: Diagnosis present

## 2014-11-05 DIAGNOSIS — Z9889 Other specified postprocedural states: Secondary | ICD-10-CM

## 2014-11-05 DIAGNOSIS — D72829 Elevated white blood cell count, unspecified: Secondary | ICD-10-CM | POA: Diagnosis present

## 2014-11-05 DIAGNOSIS — Z681 Body mass index (BMI) 19 or less, adult: Secondary | ICD-10-CM

## 2014-11-05 DIAGNOSIS — Z880 Allergy status to penicillin: Secondary | ICD-10-CM

## 2014-11-05 DIAGNOSIS — C787 Secondary malignant neoplasm of liver and intrahepatic bile duct: Secondary | ICD-10-CM | POA: Diagnosis present

## 2014-11-05 HISTORY — DX: Malignant neoplasm of pancreas, unspecified: C25.9

## 2014-11-05 LAB — COMPREHENSIVE METABOLIC PANEL
ALT: 20 U/L (ref 0–35)
AST: 22 U/L (ref 0–37)
Albumin: 1.9 g/dL — ABNORMAL LOW (ref 3.5–5.2)
Alkaline Phosphatase: 234 U/L — ABNORMAL HIGH (ref 39–117)
Anion gap: 13 (ref 5–15)
BILIRUBIN TOTAL: 0.5 mg/dL (ref 0.3–1.2)
BUN: 20 mg/dL (ref 6–23)
CO2: 25 mEq/L (ref 19–32)
CREATININE: 0.47 mg/dL — AB (ref 0.50–1.10)
Calcium: 10 mg/dL (ref 8.4–10.5)
Chloride: 95 mEq/L — ABNORMAL LOW (ref 96–112)
GFR calc Af Amer: >90 mL/min (ref >90)
Glucose, Bld: 165 mg/dL — ABNORMAL HIGH (ref 70–99)
Potassium: 4.5 mEq/L (ref 3.7–5.3)
Sodium: 133 mEq/L — ABNORMAL LOW (ref 137–147)
Total Protein: 7 g/dL (ref 6.0–8.3)

## 2014-11-05 LAB — CBC WITH DIFFERENTIAL/PLATELET
BASOS ABS: 0 10*3/uL (ref 0.0–0.1)
Basophils Relative: 0 % (ref 0–1)
Eosinophils Absolute: 0.1 10*3/uL (ref 0.0–0.7)
Eosinophils Relative: 1 % (ref 0–5)
HCT: 21.8 % — ABNORMAL LOW (ref 36.0–46.0)
HEMOGLOBIN: 7.1 g/dL — AB (ref 12.0–15.0)
LYMPHS PCT: 6 % — AB (ref 12–46)
Lymphs Abs: 0.9 10*3/uL (ref 0.7–4.0)
MCH: 26.5 pg (ref 26.0–34.0)
MCHC: 32.6 g/dL (ref 30.0–36.0)
MCV: 81.3 fL (ref 78.0–100.0)
MONO ABS: 0.1 10*3/uL (ref 0.1–1.0)
MONOS PCT: 1 % — AB (ref 3–12)
NEUTROS ABS: 13.6 10*3/uL — AB (ref 1.7–7.7)
Neutrophils Relative %: 92 % — ABNORMAL HIGH (ref 43–77)
Platelets: 405 10*3/uL — ABNORMAL HIGH (ref 150–400)
RBC: 2.68 MIL/uL — ABNORMAL LOW (ref 3.87–5.11)
RDW: 14.1 % (ref 11.5–15.5)
WBC: 14.8 10*3/uL — AB (ref 4.0–10.5)

## 2014-11-05 LAB — I-STAT CG4 LACTIC ACID, ED: LACTIC ACID, VENOUS: 1.4 mmol/L (ref 0.5–2.2)

## 2014-11-05 LAB — CBG MONITORING, ED: GLUCOSE-CAPILLARY: 155 mg/dL — AB (ref 70–99)

## 2014-11-05 MED ORDER — LIDOCAINE-PRILOCAINE 2.5-2.5 % EX CREA: 1.0000 "application " | TOPICAL_CREAM | CUTANEOUS | Status: DC | PRN

## 2014-11-05 MED ORDER — ENSURE COMPLETE PO LIQD
237.0000 mL | Freq: Two times a day (BID) | ORAL | Status: DC
  Administered 2014-11-06: 237 mL via ORAL

## 2014-11-05 MED ORDER — ACETAMINOPHEN 325 MG PO TABS
650.0000 mg | ORAL_TABLET | Freq: Four times a day (QID) | ORAL | Status: DC | PRN
  Administered 2014-11-08: 650 mg via ORAL
  Filled 2014-11-05: qty 2

## 2014-11-05 MED ORDER — SORBITOL 70 % PO SOLN
30.0000 mL | Freq: Two times a day (BID) | ORAL | Status: DC
  Filled 2014-11-05 (×17): qty 30

## 2014-11-05 MED ORDER — SODIUM CHLORIDE 0.9 % IJ SOLN: 3.0000 mL | Freq: Two times a day (BID) | INTRAMUSCULAR | Status: DC

## 2014-11-05 MED ORDER — METOCLOPRAMIDE HCL 5 MG PO TABS
5.0000 mg | ORAL_TABLET | Freq: Three times a day (TID) | ORAL | Status: DC
  Administered 2014-11-06 – 2014-11-08 (×8): 5 mg via ORAL
  Filled 2014-11-05 (×11): qty 1

## 2014-11-05 MED ORDER — HYDROMORPHONE HCL 1 MG/ML IJ SOLN
1.0000 mg | Freq: Once | INTRAMUSCULAR | Status: AC
  Administered 2014-11-05: 1 mg via INTRAVENOUS
  Filled 2014-11-05: qty 1

## 2014-11-05 MED ORDER — SODIUM CHLORIDE 0.9 % IV BOLUS (SEPSIS)
1000.0000 mL | Freq: Once | INTRAVENOUS | Status: AC
  Administered 2014-11-05: 1000 mL via INTRAVENOUS

## 2014-11-05 MED ORDER — TRAMADOL HCL 50 MG PO TABS: 50.0000 mg | ORAL_TABLET | Freq: Four times a day (QID) | ORAL | Status: DC | PRN

## 2014-11-05 MED ORDER — HEPARIN SODIUM (PORCINE) 5000 UNIT/ML IJ SOLN
5000.0000 [IU] | Freq: Three times a day (TID) | INTRAMUSCULAR | Status: DC
  Administered 2014-11-06 (×2): 5000 [IU] via SUBCUTANEOUS
  Filled 2014-11-05 (×5): qty 1

## 2014-11-05 MED ORDER — LISINOPRIL 10 MG PO TABS
10.0000 mg | ORAL_TABLET | Freq: Every day | ORAL | Status: DC
  Administered 2014-11-06: 10 mg via ORAL
  Filled 2014-11-05: qty 1

## 2014-11-05 MED ORDER — ONDANSETRON HCL 4 MG/2ML IJ SOLN
4.0000 mg | Freq: Three times a day (TID) | INTRAMUSCULAR | Status: DC | PRN
  Administered 2014-11-06: 4 mg via INTRAVENOUS
  Filled 2014-11-05: qty 2

## 2014-11-05 MED ORDER — INSULIN ASPART 100 UNIT/ML ~~LOC~~ SOLN
0.0000 [IU] | Freq: Three times a day (TID) | SUBCUTANEOUS | Status: DC
  Administered 2014-11-06: 2 [IU] via SUBCUTANEOUS
  Administered 2014-11-06: 3 [IU] via SUBCUTANEOUS
  Administered 2014-11-06: 2 [IU] via SUBCUTANEOUS
  Administered 2014-11-07 (×2): 3 [IU] via SUBCUTANEOUS
  Administered 2014-11-08: 2 [IU] via SUBCUTANEOUS

## 2014-11-05 MED ORDER — ADULT MULTIVITAMIN W/MINERALS CH
1.0000 | ORAL_TABLET | Freq: Every day | ORAL | Status: DC
  Administered 2014-11-06 – 2014-11-08 (×3): 1 via ORAL
  Filled 2014-11-05 (×3): qty 1

## 2014-11-05 MED ORDER — ONDANSETRON HCL 4 MG/2ML IJ SOLN
4.0000 mg | Freq: Once | INTRAMUSCULAR | Status: AC
  Administered 2014-11-05: 4 mg via INTRAVENOUS
  Filled 2014-11-05: qty 2

## 2014-11-05 MED ORDER — HYDROCODONE-ACETAMINOPHEN 5-325 MG PO TABS: 1.0000 | ORAL_TABLET | Freq: Four times a day (QID) | ORAL | Status: DC | PRN

## 2014-11-05 MED ORDER — INSULIN DETEMIR 100 UNIT/ML ~~LOC~~ SOLN
4.0000 [IU] | Freq: Every day | SUBCUTANEOUS | Status: DC
  Administered 2014-11-06 – 2014-11-07 (×2): 4 [IU] via SUBCUTANEOUS
  Filled 2014-11-05 (×4): qty 0.04

## 2014-11-05 MED ORDER — SODIUM CHLORIDE 0.9 % IV SOLN
INTRAVENOUS | Status: DC
  Administered 2014-11-05 – 2014-11-08 (×4): via INTRAVENOUS

## 2014-11-05 NOTE — H&P (Addendum)
[facility] History and Physical  Debra [name] BHA:[id number] DOB: 1941-10-11 DOA: 11/05/2014  Referring physician: ED physician PCP: Rodena Medin, MD  Specialists:   Chief Complaint: fall  HPI: Debra [name] is a 73 y.o. female with PMH of pancreatic cancer, diabetes mellitus, hypertension, anemia of chronic disease, who presents with a fall.  Patient was recently diagnosed with pancreatic cancer. Currently she is followed up by Dr. Benay Spice. She is getting chemotherapy and received chemotherapy 3 days ago (first dose). she has nausea and occasional vomiting. She has very poor oral intake and generalized weakness. In the early morning, at about 6:00 AM, she had a fall after using bathroom and tried to stand up and get off the toilet. She injured her forehead. She did not lose consciousness. She did not have palpitation, seizures, dizziness, chest pain or shortness of breath before the event. Patient does not have weakness, numbness or tingling sensations in her extremities. She denies fever, chills, cough, chest pain, SOB, diarrhea,dysuria, urgency, frequency, hematuria, skin rashes, or leg swelling. No hematuria or hematochezia.  She was found to have Hgb drop from 8.3 on 11/02/14 to 7.1. CT-head is negative for acute abnormalities. Leukocytosis with WBC 14.8. Normal temperature. Tachycardia with heart rate between 120-140. Patient is admitted to inpatient for further evaluation treatment.  Review of Systems: As presented in the history of presenting illness, rest negative.  Where does patient live?  With her daugther Can patient participate in ADLs? none   Allergy:  Allergies  Allergen Reactions  . Amoxicillin Rash    Past Medical History  Diagnosis Date  . Type II diabetes mellitus     "just dx'd today' (12/08/2013)  . Hypertension   . Acute renal failure   . DKA (diabetic ketoacidoses)     Past Surgical History  Procedure Laterality Date  . Tubal ligation    . Dental  surgery    . Eus N/A 09/28/2014    Procedure: UPPER ENDOSCOPIC ULTRASOUND (EUS) LINEAR;  Surgeon: Beryle Beams, MD;  Location: [facility];  Service: Endoscopy;  Laterality: N/A;    Social History:  reports that she has never smoked. She has never used smokeless tobacco. She reports that she does not drink alcohol or use illicit drugs.  Family History: History reviewed. No pertinent family history.   Prior to Admission medications   Medication Sig Start Date End Date Taking? Authorizing Provider  Hydrocodone-Acetaminophen (VICODIN) 5-300 MG TABS Take 1 tablet by mouth every 6 (six) hours as needed. 09/29/14  Yes Venetia Maxon Rama, MD  ibuprofen (ADVIL,MOTRIN) 200 MG tablet Take 200 mg by mouth every 6 (six) hours as needed for mild pain.   Yes Historical Provider, MD  insulin detemir (LEVEMIR) 100 UNIT/ML injection Inject 6 Units into the skin at bedtime.   Yes Historical Provider, MD  lidocaine-prilocaine (EMLA) cream Apply 1 application topically as needed. Apply to [facility] site 1-2 hours prior to stick and cover with plastic wrap 10/11/14  Yes Ladell Pier, MD  lisinopril (PRINIVIL,ZESTRIL) 10 MG tablet Take 1 tablet (10 mg total) by mouth daily. 12/12/13  Yes Eugenie Filler, MD  metoCLOPramide (REGLAN) 5 MG tablet Take 1 tablet (5 mg total) by mouth 3 (three) times daily before meals. 10/11/14  Yes Ladell Pier, MD  Multiple Vitamin (MULTIVITAMIN WITH MINERALS) TABS tablet Take 1 tablet by mouth daily. 09/29/14  Yes Christina P Rama, MD  ondansetron (ZOFRAN) 4 MG tablet Take 1 tablet (4 mg total) by mouth every 6 (six)  hours as needed for nausea. 09/29/14  Yes Christina P Rama, MD  sorbitol 70 % solution Take 30 mLs by mouth 2 (two) times daily. 10/23/14  Yes Owens Shark, NP  traMADol (ULTRAM) 50 MG tablet Take 1 tablet (50 mg total) by mouth every 6 (six) hours as needed for moderate pain. 10/11/14  Yes Ladell Pier, MD  acetaminophen (TYLENOL) 325 MG tablet Take 2 tablets (650 mg  total) by mouth every 6 (six) hours as needed for mild pain. 09/29/14   Venetia Maxon Rama, MD    Physical Exam: Filed Vitals:   11/05/14 2000 11/05/14 2030 11/05/14 2100 11/05/14 2133  BP: 140/76 148/77 142/77 143/77  Pulse: 123 126 120 124  Temp:      TempSrc:      Resp: 17 17 21 23   SpO2: 100% 100% 100% 100%   General: Not in acute distress HEENT: there is a small hematoma over the right forehead, 2 cm in size proximately       Eyes: PERRL, EOMI, no scleral icterus       ENT: No discharge from the ears and nose, no pharynx injection, no tonsillar enlargement.        Neck: No JVD, no bruit, no mass felt. Cardiac: S1/S2, RRR, tachycardia, No murmurs, No gallops or rubs Pulm: Good air movement bilaterally. Clear to auscultation bilaterally. No rales, wheezing, rhonchi or rubs. Abd: Soft, nondistended, there is mild tenderness over epigastric area, no rebound pain, no organomegaly, BS present Ext: No edema bilaterally. 2+DP/PT pulse bilaterally Musculoskeletal: No joint deformities, erythema, or stiffness, ROM full Skin: No rashes.  Neuro: Alert and oriented X3, cranial nerves II-XII grossly intact, muscle strength 5/5 in all extremeties, sensation to light touch intact. Brachial reflex 2+ bilaterally. Knee reflex 1+ bilaterally. Negative Babinski's sign. Normal finger to nose test. Psych: Patient is not psychotic, no suicidal or hemocidal ideation.  Labs on Admission:  Basic Metabolic Panel:  Recent Labs Lab 11/02/14 1153 11/05/14 1727  NA 134* 133*  K 4.1 4.5  CL  --  95*  CO2 25 25  GLUCOSE 372* 165*  BUN 19.4 20  CREATININE 0.8 0.47*  CALCIUM 10.0 10.0   Liver Function Tests:  Recent Labs Lab 11/02/14 1153 11/05/14 1727  AST 17 22  ALT 15 20  ALKPHOS 289* 234*  BILITOT 0.32 0.5  PROT 7.3 7.0  ALBUMIN 2.2* 1.9*   No results for input(s): LIPASE, AMYLASE in the last 168 hours. No results for input(s): AMMONIA in the last 168 hours. CBC:  Recent Labs Lab  11/02/14 1153 11/05/14 1727  WBC 16.2* 14.8*  NEUTROABS 13.5* 13.6*  HGB 8.3* 7.1*  HCT 25.5* 21.8*  MCV 82.8 81.3  PLT 473* 405*   Cardiac Enzymes: No results for input(s): CKTOTAL, CKMB, CKMBINDEX, TROPONINI in the last 168 hours.  BNP (last 3 results) No results for input(s): PROBNP in the last 8760 hours. CBG:  Recent Labs Lab 11/05/14 1753  GLUCAP 155*    Radiological Exams on Admission: Dg Chest 2 View  11/05/2014   CLINICAL DATA:  Fall with dizziness and left-sided pain. Initial encounter. Metastatic pancreatic cancer.  EXAM: CHEST  2 VIEW  COMPARISON:  12/08/2013 chest radiographs  FINDINGS: The cardiomediastinal silhouette is unremarkable.  A right central venous catheter is identified with tip overlying the superior cavoatrial junction.  Calcified granulomas in the right lower lung again noted.  There is no evidence of focal airspace disease, pulmonary edema, suspicious pulmonary nodule/mass, pleural effusion,  or pneumothorax. No acute bony abnormalities are identified.  IMPRESSION: No active cardiopulmonary disease.   Electronically Signed   By: Hassan Rowan M.D.   On: 11/05/2014 19:01   Ct Head Wo Contrast  11/05/2014   CLINICAL DATA:  Fall this morning, hit head.  EXAM: CT HEAD WITHOUT CONTRAST  TECHNIQUE: Contiguous axial images were obtained from the base of the skull through the vertex without intravenous contrast.  COMPARISON:  None.  FINDINGS: No evidence of an acute infarct, acute hemorrhage, mass lesion, mass effect or hydrocephalus. Mild atrophy. Mild periventricular low attenuation. Visualized portions of the paranasal sinuses and mastoid air cells are clear. No fracture.  IMPRESSION: 1. No acute intracranial abnormality. 2. Mild atrophy and mild chronic microvascular white matter ischemic changes.   Electronically Signed   By: Lorin Picket M.D.   On: 11/05/2014 18:53    Assessment/Plan Principal Problem:   Fall Active Problems:   HTN (hypertension)    Leukocytosis   Anemia of chronic disease   Protein-calorie malnutrition, severe   Uncontrolled diabetes mellitus with complications   Cancer of pancreas, tail  Fall: it is most likely caused by combination of possible orthostatic status and generalized deconditioning. Patient has nausea and vomiting which are likely secondary to chemotherapy, making dehydration and orthostatic status very likely. In addition, patient has worsening anemia, which may have also contributed. Patient did not have any alarming symptoms before the event happened, such as palpitation, chest pain, seizure and unilateral weakness or numbness - Will admit to tele bed given tachycardia - check orthostatic signs. - IVF: 2L of NS, followed by 125 mL per hour  Leukocytosis: Etiology is not clear. Given the patient has [employer] and being treated with chemotherapy, it is important to rule out infection -will get blood culture x 2 and urine culture -repeat CBC in AM  Anemia of chronic disease: Hgb dropped from 8.3 to 7.1. No source of bleeding identified -will check cbc q6h -FOBT  DM-II: Last A1c was 8.4 on 09/26/14. On Levemir 6 units at home daily -Decreased Levemir from 6 to 4 units daily -SSI  Severe protein-capillary mulnutrition: Secondary to pancreatic cancer  - Ensure  Pancreatic cancer: Followed up by Dr. Benay Spice. receiving chemotherapy - follow up with Dr. Benay Spice as outpt   DVT ppx: SQ Heparin     Code Status: Full code Family Communication:   Yes, patient's  daugher     at bed side Disposition Plan: Admit to inpatient   Date of Service 11/05/2014    Ivor Costa [facility] Pager [phone]  If 7PM-7AM, please contact night-coverage [email or web address] Password [facility] 11/05/2014, 9:54 PM

## 2014-11-05 NOTE — ED Notes (Addendum)
Pt in with family reporting a fall this morning, fall was unwitnessed, since fall family reports patient has vomited, not ambulating per normal, reports dizziness, pt denies headache, c/o pain to left flank but this is not a new complaint. Pt alert and oriented. Pt is currently on chemo for pancreatic cancer.

## 2014-11-05 NOTE — Telephone Encounter (Signed)
Received phone call from patient's daughter Debra [name]) stating that patient fell and hit her head and is now complaining of dizziness.  Patient's daughter is wanting to know if she should take patient to the ED or bring her in to see [place]. Informed patient's daughter to take patient to the ED.  Per Elby Showers. Marcello Moores, NP.  Patient's daughter verbalized understanding.

## 2014-11-05 NOTE — ED Notes (Signed)
Family at bedside. 

## 2014-11-05 NOTE — ED Provider Notes (Signed)
CSN: [id number]     Arrival date & time 11/05/14  1656 History   First MD Initiated Contact with Patient 11/05/14 1709     Chief Complaint  Patient presents with  . Fall     (Consider location/radiation/quality/duration/timing/severity/associated sxs/prior Treatment) HPI 73 year old female with a history of pancreatic cancer, who recently underwent induction chemotherapy 3 days ago presents following a fall. Has had nausea and vomiting persistently for the past 24 hours, and when trying to get off the toilet this morning, she had an unwitnessed fall, hit her head. Unknown whether she had a loss of consciousness. Daughter reports reports that over the course of today she's become increasingly weak, , and has had a persistent headache. She denies any seizures, difficulty with speech or motor function, or paresthesias. She has had no cough or shortness of breath. She has had no dysuria.  Past Medical History  Diagnosis Date  . Type II diabetes mellitus     "just dx'd today' (12/08/2013)  . Hypertension   . Acute renal failure   . DKA (diabetic ketoacidoses)    Past Surgical History  Procedure Laterality Date  . Tubal ligation    . Dental surgery    . Eus N/A 09/28/2014    Procedure: UPPER ENDOSCOPIC ULTRASOUND (EUS) LINEAR;  Surgeon: Beryle Beams, MD;  Location: [facility];  Service: Endoscopy;  Laterality: N/A;   History reviewed. No pertinent family history. History  Substance Use Topics  . Smoking status: Never Smoker   . Smokeless tobacco: Never Used  . Alcohol Use: No   OB History    No data available     Review of Systems  Gastrointestinal: Positive for nausea, vomiting and abdominal pain. Negative for abdominal distention.  Neurological: Positive for light-headedness and headaches. Negative for dizziness, seizures, syncope, speech difficulty, weakness and numbness.  All other systems reviewed and are negative.     Allergies  Amoxicillin  Home Medications    Prior to Admission medications   Medication Sig Start Date End Date Taking? Authorizing Provider  Hydrocodone-Acetaminophen (VICODIN) 5-300 MG TABS Take 1 tablet by mouth every 6 (six) hours as needed. 09/29/14  Yes Venetia Maxon Rama, MD  ibuprofen (ADVIL,MOTRIN) 200 MG tablet Take 200 mg by mouth every 6 (six) hours as needed for mild pain.   Yes Historical Provider, MD  insulin detemir (LEVEMIR) 100 UNIT/ML injection Inject 6 Units into the skin at bedtime.   Yes Historical Provider, MD  lidocaine-prilocaine (EMLA) cream Apply 1 application topically as needed. Apply to [facility] site 1-2 hours prior to stick and cover with plastic wrap 10/11/14  Yes Ladell Pier, MD  lisinopril (PRINIVIL,ZESTRIL) 10 MG tablet Take 1 tablet (10 mg total) by mouth daily. 12/12/13  Yes Eugenie Filler, MD  metoCLOPramide (REGLAN) 5 MG tablet Take 1 tablet (5 mg total) by mouth 3 (three) times daily before meals. 10/11/14  Yes Ladell Pier, MD  Multiple Vitamin (MULTIVITAMIN WITH MINERALS) TABS tablet Take 1 tablet by mouth daily. 09/29/14  Yes Christina P Rama, MD  ondansetron (ZOFRAN) 4 MG tablet Take 1 tablet (4 mg total) by mouth every 6 (six) hours as needed for nausea. 09/29/14  Yes Christina P Rama, MD  sorbitol 70 % solution Take 30 mLs by mouth 2 (two) times daily. 10/23/14  Yes Owens Shark, NP  traMADol (ULTRAM) 50 MG tablet Take 1 tablet (50 mg total) by mouth every 6 (six) hours as needed for moderate pain. 10/11/14  Yes Dominica Severin  Darrold Junker, MD  acetaminophen (TYLENOL) 325 MG tablet Take 2 tablets (650 mg total) by mouth every 6 (six) hours as needed for mild pain. 09/29/14   Christina P Rama, MD   BP 150/75 mmHg  Pulse 123  Temp(Src) 98.3 F (36.8 C) (Oral)  Resp 16  Ht 5\' 1"  (1.549 m)  Wt 99 lb 3.2 oz (44.997 kg)  BMI 18.75 kg/m2  SpO2 99% Physical Exam  Constitutional: She is oriented to person, place, and time. She appears well-developed. No distress.  Thin female  HENT:  Head: Normocephalic.   3 cm hematoma over the right forehead  Eyes: Conjunctivae and EOM are normal. Pupils are equal, round, and reactive to light.  Neck: Normal range of motion. No thyromegaly present.  Cardiovascular: Normal rate, regular rhythm and normal heart sounds.   No murmur heard. Pulmonary/Chest: Effort normal and breath sounds normal. No respiratory distress.  Tachypnea  Abdominal: Soft. She exhibits mass (Midepigastric). There is tenderness (midepigastric). There is guarding.  Musculoskeletal: Normal range of motion. She exhibits no edema.  Neurological: She is alert and oriented to person, place, and time.  Skin: Skin is warm and dry.  Psychiatric: She has a normal mood and affect.  Nursing note and vitals reviewed.   ED Course  Procedures (including critical care time) Labs Review Labs Reviewed  CBC WITH DIFFERENTIAL - Abnormal; Notable for the following:    WBC 14.8 (*)    RBC 2.68 (*)    Hemoglobin 7.1 (*)    HCT 21.8 (*)    Platelets 405 (*)    Neutrophils Relative % 92 (*)    Neutro Abs 13.6 (*)    Lymphocytes Relative 6 (*)    Monocytes Relative 1 (*)    All other components within normal limits  COMPREHENSIVE METABOLIC PANEL - Abnormal; Notable for the following:    Sodium 133 (*)    Chloride 95 (*)    Glucose, Bld 165 (*)    Creatinine, Ser 0.47 (*)    Albumin 1.9 (*)    Alkaline Phosphatase 234 (*)    All other components within normal limits  CBG MONITORING, ED - Abnormal; Notable for the following:    Glucose-Capillary 155 (*)    All other components within normal limits  CULTURE, BLOOD (ROUTINE X 2)  CULTURE, BLOOD (ROUTINE X 2)  CBC  CBC  CBC  PROTIME-INR  BASIC METABOLIC PANEL  CBC  CBC  I-STAT CG4 LACTIC ACID, ED  TYPE AND SCREEN    Imaging Review Dg Chest 2 View  11/05/2014   CLINICAL DATA:  Fall with dizziness and left-sided pain. Initial encounter. Metastatic pancreatic cancer.  EXAM: CHEST  2 VIEW  COMPARISON:  12/08/2013 chest radiographs   FINDINGS: The cardiomediastinal silhouette is unremarkable.  A right central venous catheter is identified with tip overlying the superior cavoatrial junction.  Calcified granulomas in the right lower lung again noted.  There is no evidence of focal airspace disease, pulmonary edema, suspicious pulmonary nodule/mass, pleural effusion, or pneumothorax. No acute bony abnormalities are identified.  IMPRESSION: No active cardiopulmonary disease.   Electronically Signed   By: Hassan Rowan M.D.   On: 11/05/2014 19:01   Ct Head Wo Contrast  11/05/2014   CLINICAL DATA:  Fall this morning, hit head.  EXAM: CT HEAD WITHOUT CONTRAST  TECHNIQUE: Contiguous axial images were obtained from the base of the skull through the vertex without intravenous contrast.  COMPARISON:  None.  FINDINGS: No evidence of an acute  infarct, acute hemorrhage, mass lesion, mass effect or hydrocephalus. Mild atrophy. Mild periventricular low attenuation. Visualized portions of the paranasal sinuses and mastoid air cells are clear. No fracture.  IMPRESSION: 1. No acute intracranial abnormality. 2. Mild atrophy and mild chronic microvascular white matter ischemic changes.   Electronically Signed   By: Lorin Picket M.D.   On: 11/05/2014 18:53     EKG Interpretation   Date/Time:  Monday November 05 2014 17:04:18 EST Ventricular Rate:  145 PR Interval:  124 QRS Duration: 68 QT Interval:  276 QTC Calculation: 428 R Axis:   10 Text Interpretation:  Sinus tachycardia Possible Left atrial enlargement  Nonspecific ST abnormality Abnormal ECG Confirmed by RAY MD, Andee Poles  ([id number]) on 11/05/2014 5:54:14 PM      MDM   Final diagnoses:  Anemia of chronic disease  Syncope, unspecified syncope type   73 year old female with metastatic pancreatic cancer presents following a syncopal event. No evidence of acute intracranial trauma. Patient was tachycardic to 150s on arrival, but improved to 120s with IV rehydration. Review of outside records  shows that she seems to be persistently tachycardic secondary to her metastatic tumor burden, and this is been present multiple outpatient visits. She has however had a significant drop in her hemoglobin, a partially a gram of the last 8 days. Considering she has had a syncopal event, now has a hemoglobin of 7, this is a symptomatic anemia. We'll plan to admit her for observation, transfusion should it be indicated.   Leata Mouse, MD 11/06/14 6811  Shaune Pollack, MD 11/08/14 [phone]

## 2014-11-05 NOTE — Progress Notes (Signed)
Received report from ED. Nedra Mcinnis Thacker, RN 

## 2014-11-06 ENCOUNTER — Encounter (HOSPITAL_COMMUNITY): Payer: Self-pay | Admitting: General Practice

## 2014-11-06 DIAGNOSIS — D72829 Elevated white blood cell count, unspecified: Secondary | ICD-10-CM

## 2014-11-06 DIAGNOSIS — C787 Secondary malignant neoplasm of liver and intrahepatic bile duct: Secondary | ICD-10-CM | POA: Diagnosis present

## 2014-11-06 DIAGNOSIS — Z794 Long term (current) use of insulin: Secondary | ICD-10-CM | POA: Diagnosis not present

## 2014-11-06 DIAGNOSIS — Z66 Do not resuscitate: Secondary | ICD-10-CM | POA: Diagnosis present

## 2014-11-06 DIAGNOSIS — Z79899 Other long term (current) drug therapy: Secondary | ICD-10-CM | POA: Diagnosis not present

## 2014-11-06 DIAGNOSIS — S0990XA Unspecified injury of head, initial encounter: Secondary | ICD-10-CM | POA: Diagnosis present

## 2014-11-06 DIAGNOSIS — R630 Anorexia: Secondary | ICD-10-CM | POA: Diagnosis present

## 2014-11-06 DIAGNOSIS — C259 Malignant neoplasm of pancreas, unspecified: Secondary | ICD-10-CM

## 2014-11-06 DIAGNOSIS — T451X5A Adverse effect of antineoplastic and immunosuppressive drugs, initial encounter: Secondary | ICD-10-CM | POA: Diagnosis present

## 2014-11-06 DIAGNOSIS — E1165 Type 2 diabetes mellitus with hyperglycemia: Secondary | ICD-10-CM | POA: Diagnosis present

## 2014-11-06 DIAGNOSIS — Z681 Body mass index (BMI) 19 or less, adult: Secondary | ICD-10-CM | POA: Diagnosis not present

## 2014-11-06 DIAGNOSIS — R112 Nausea with vomiting, unspecified: Secondary | ICD-10-CM | POA: Diagnosis present

## 2014-11-06 DIAGNOSIS — D638 Anemia in other chronic diseases classified elsewhere: Secondary | ICD-10-CM | POA: Diagnosis present

## 2014-11-06 DIAGNOSIS — Z880 Allergy status to penicillin: Secondary | ICD-10-CM | POA: Diagnosis not present

## 2014-11-06 DIAGNOSIS — I10 Essential (primary) hypertension: Secondary | ICD-10-CM | POA: Diagnosis present

## 2014-11-06 DIAGNOSIS — E43 Unspecified severe protein-calorie malnutrition: Secondary | ICD-10-CM

## 2014-11-06 DIAGNOSIS — Z9889 Other specified postprocedural states: Secondary | ICD-10-CM | POA: Diagnosis not present

## 2014-11-06 DIAGNOSIS — Y92002 Bathroom of unspecified non-institutional (private) residence single-family (private) house as the place of occurrence of the external cause: Secondary | ICD-10-CM | POA: Diagnosis not present

## 2014-11-06 DIAGNOSIS — C252 Malignant neoplasm of tail of pancreas: Secondary | ICD-10-CM | POA: Diagnosis present

## 2014-11-06 DIAGNOSIS — E860 Dehydration: Secondary | ICD-10-CM | POA: Diagnosis present

## 2014-11-06 DIAGNOSIS — W19XXXA Unspecified fall, initial encounter: Secondary | ICD-10-CM | POA: Diagnosis present

## 2014-11-06 LAB — BASIC METABOLIC PANEL
Anion gap: 17 — ABNORMAL HIGH (ref 5–15)
BUN: 18 mg/dL (ref 6–23)
CHLORIDE: 98 meq/L (ref 96–112)
CO2: 20 mEq/L (ref 19–32)
Calcium: 8.9 mg/dL (ref 8.4–10.5)
Creatinine, Ser: 0.46 mg/dL — ABNORMAL LOW (ref 0.50–1.10)
GFR calc Af Amer: >90 mL/min (ref >90)
GFR calc non Af Amer: >90 mL/min (ref >90)
GLUCOSE: 175 mg/dL — AB (ref 70–99)
POTASSIUM: 4.2 meq/L (ref 3.7–5.3)
Sodium: 135 mEq/L — ABNORMAL LOW (ref 137–147)

## 2014-11-06 LAB — CBC
HCT: 19.9 % — ABNORMAL LOW (ref 36.0–46.0)
HEMATOCRIT: 22.1 % — AB (ref 36.0–46.0)
HEMOGLOBIN: 6.7 g/dL — AB (ref 12.0–15.0)
HEMOGLOBIN: 7.2 g/dL — AB (ref 12.0–15.0)
MCH: 27.3 pg (ref 26.0–34.0)
MCH: 28.2 pg (ref 26.0–34.0)
MCHC: 32.6 g/dL (ref 30.0–36.0)
MCHC: 33.7 g/dL (ref 30.0–36.0)
MCV: 83.6 fL (ref 78.0–100.0)
MCV: 83.7 fL (ref 78.0–100.0)
Platelets: 373 10*3/uL (ref 150–400)
Platelets: 374 10*3/uL (ref 150–400)
RBC: 2.64 MIL/uL — ABNORMAL LOW (ref 3.87–5.11)
RBC: 2.38 MIL/uL — AB (ref 3.87–5.11)
RDW: 14.4 % (ref 11.5–15.5)
RDW: 14.4 % (ref 11.5–15.5)
WBC: 12 10*3/uL — ABNORMAL HIGH (ref 4.0–10.5)
WBC: 14 10*3/uL — AB (ref 4.0–10.5)

## 2014-11-06 LAB — ABO/RH: ABO/RH(D): O POS

## 2014-11-06 LAB — URINALYSIS, ROUTINE W REFLEX MICROSCOPIC
Bilirubin Urine: NEGATIVE
Glucose, UA: NEGATIVE mg/dL
Hgb urine dipstick: NEGATIVE
Ketones, ur: 15 mg/dL — AB
LEUKOCYTES UA: NEGATIVE
NITRITE: NEGATIVE
PH: 5 (ref 5.0–8.0)
Protein, ur: 30 mg/dL — AB
SPECIFIC GRAVITY, URINE: 1.023 (ref 1.005–1.030)
UROBILINOGEN UA: 1 mg/dL (ref 0.0–1.0)

## 2014-11-06 LAB — PROTIME-INR
INR: 1.3 (ref 0.00–1.49)
PROTHROMBIN TIME: 16.3 s — AB (ref 11.6–15.2)

## 2014-11-06 LAB — URINE MICROSCOPIC-ADD ON

## 2014-11-06 LAB — GLUCOSE, CAPILLARY
GLUCOSE-CAPILLARY: 129 mg/dL — AB (ref 70–99)
GLUCOSE-CAPILLARY: 223 mg/dL — AB (ref 70–99)
GLUCOSE-CAPILLARY: 190 mg/dL — AB (ref 70–99)
Glucose-Capillary: 173 mg/dL — ABNORMAL HIGH (ref 70–99)
Glucose-Capillary: 186 mg/dL — ABNORMAL HIGH (ref 70–99)

## 2014-11-06 LAB — PREPARE RBC (CROSSMATCH)

## 2014-11-06 MED ORDER — SODIUM CHLORIDE 0.9 % IV SOLN
Freq: Once | INTRAVENOUS | Status: AC
  Administered 2014-11-06: 09:00:00 via INTRAVENOUS

## 2014-11-06 MED ORDER — SORBITOL 70 % SOLN
30.0000 mL | Freq: Two times a day (BID) | Status: DC
  Administered 2014-11-06: 30 mL via ORAL
  Filled 2014-11-06 (×7): qty 30

## 2014-11-06 MED ORDER — LISINOPRIL 10 MG PO TABS
10.0000 mg | ORAL_TABLET | Freq: Once | ORAL | Status: AC
  Administered 2014-11-06: 10 mg via ORAL
  Filled 2014-11-06: qty 1

## 2014-11-06 MED ORDER — ENSURE PUDDING PO PUDG
1.0000 | Freq: Two times a day (BID) | ORAL | Status: DC
  Administered 2014-11-06: 1 via ORAL

## 2014-11-06 MED ORDER — VANCOMYCIN HCL 500 MG IV SOLR
500.0000 mg | INTRAVENOUS | Status: DC
  Administered 2014-11-07 – 2014-11-08 (×2): 500 mg via INTRAVENOUS
  Filled 2014-11-06 (×3): qty 500

## 2014-11-06 MED ORDER — DEXTROSE 5 % IV SOLN
1.0000 g | Freq: Three times a day (TID) | INTRAVENOUS | Status: DC
  Administered 2014-11-06 – 2014-11-08 (×8): 1 g via INTRAVENOUS
  Filled 2014-11-06 (×10): qty 1

## 2014-11-06 MED ORDER — VANCOMYCIN HCL IN DEXTROSE 1-5 GM/200ML-% IV SOLN
1000.0000 mg | Freq: Once | INTRAVENOUS | Status: AC
  Administered 2014-11-06: 1000 mg via INTRAVENOUS
  Filled 2014-11-06: qty 200

## 2014-11-06 MED ORDER — LISINOPRIL 20 MG PO TABS
20.0000 mg | ORAL_TABLET | Freq: Every day | ORAL | Status: DC
  Administered 2014-11-07 – 2014-11-08 (×2): 20 mg via ORAL
  Filled 2014-11-06 (×2): qty 1

## 2014-11-06 MED ORDER — BOOST PLUS PO LIQD
237.0000 mL | Freq: Two times a day (BID) | ORAL | Status: DC
  Administered 2014-11-06 – 2014-11-08 (×3): 237 mL via ORAL
  Filled 2014-11-06 (×7): qty 237

## 2014-11-06 NOTE — Progress Notes (Signed)
ANTIBIOTIC CONSULT NOTE - INITIAL  Pharmacy Consult for Vancomcyin and Aztreonam Indication: rule out sepsis  Allergies  Allergen Reactions  . Amoxicillin Rash    Patient Measurements: Height: 5\' 1"  (154.9 cm) Weight: 98 lb 12.8 oz (44.815 kg) IBW/kg (Calculated) : 47.8  Vital Signs: Temp: 98.8 F (37.1 C) (12/01 0514) Temp Source: Oral (12/01 0514) BP: 150/75 mmHg (11/30 2259) Pulse Rate: 123 (11/30 2259) Intake/Output from previous day:   Intake/Output from this shift:    Labs:  Recent Labs  11/05/14 1727 11/05/14 2350 11/06/14 0617  WBC 14.8* 14.0* 12.0*  HGB 7.1* 7.2* 6.7*  PLT 405* 373 374  CREATININE 0.47*  --  0.46*   Estimated Creatinine Clearance: 43 mL/min (by C-G formula based on Cr of 0.46). No results for input(s): VANCOTROUGH, VANCOPEAK, VANCORANDOM, GENTTROUGH, GENTPEAK, GENTRANDOM, TOBRATROUGH, TOBRAPEAK, TOBRARND, AMIKACINPEAK, AMIKACINTROU, AMIKACIN in the last 72 hours.   Microbiology: No results found for this or any previous visit (from the past 720 hour(s)).  Medical History: Past Medical History  Diagnosis Date  . Type II diabetes mellitus     "just dx'd today' (12/08/2013)  . Hypertension   . Acute renal failure   . DKA (diabetic ketoacidoses)     Medications:  Scheduled:  . sodium chloride   Intravenous Once  . feeding supplement (ENSURE COMPLETE)  237 mL Oral BID BM  . heparin  5,000 Units Subcutaneous 3 times per day  . insulin aspart  0-9 Units Subcutaneous TID WC  . insulin detemir  4 Units Subcutaneous QHS  . lisinopril  10 mg Oral Daily  . metoCLOPramide  5 mg Oral TID AC  . multivitamin with minerals  1 tablet Oral Daily  . sodium chloride  3 mL Intravenous Q12H  . sorbitol  30 mL Oral BID   Assessment: 73 yo female with possible sepsis for empiric antibiotics  Goal of Therapy:  Vancomycin trough level 15-20 mcg/ml  Plan:  Vancomycin 1 g IV now, then 500 mg IV q24h Aztreonam 1 g IV q8h   Torien Ramroop, Bronson Curb 11/06/2014,7:49 AM

## 2014-11-06 NOTE — Progress Notes (Signed)
INITIAL NUTRITION ASSESSMENT  DOCUMENTATION CODES Per approved criteria  -Severe malnutrition in the context of chronic illness  Pt meets criteria for severe MALNUTRITION in the context of chronic illness as evidenced by severe fat and muscle wasting and 20% weight loss in 2 months.  INTERVENTION: - D/C Ensure Complete - Boost Plus BID  - Ensure Pudding po BID, each supplement provides 170 kcal and 4 grams of protein  NUTRITION DIAGNOSIS: Inadequate oral intake related to pancreatic cancer as evidenced by poor appetite and weight loss.   Goal: Pt to meet >/= 90% of their estimated nutrition needs   Monitor:  Weight trend, po intake, acceptance of supplements, labs  Reason for Assessment: MST  73 y.o. female  Admitting Dx: Fall  ASSESSMENT: 73 y.o. female with PMH of pancreatic cancer, diabetes mellitus, hypertension, anemia of chronic disease, who presents with a fall.  - Nutritional history obtained from pt and pt's daughter.  - Pt has dentures and reports that she is able to eat well with them in.  - Pt's usual body weight 2 months ago is 123 lbs. Daughter reports that pt has lost ~80 lbs since January. She drinks 1-2 Boost supplements at home. She has tried Ensure, but complains of GI upset. Pt encouraged to continue supplements at home and to increase to 2-3/day. Will send supplements during hospital stay.   Labs: Na low K and BUN WNL  Nutrition Focused Physical Exam:  Subcutaneous Fat:  Orbital Region: severe depletion  Upper Arm Region: severe depletion Thoracic and Lumbar Region: severe depletion  Muscle:  Temple Region: severe depletion Clavicle Bone Region: severe depletion Clavicle and Acromion Bone Region: severe depletion Scapular Bone Region: severe depletion Dorsal Hand: severe depletion Patellar Region: severe depletion Anterior Thigh Region: severe depletion Posterior Calf Region: severe depletion  Edema: none  Height: Ht Readings from Last 1  Encounters:  11/05/14 5\' 1"  (1.549 m)    Weight: Wt Readings from Last 1 Encounters:  11/06/14 98 lb 12.8 oz (44.815 kg)    Ideal Body Weight: 47.8 kg  % Ideal Body Weight: 94%  Wt Readings from Last 10 Encounters:  11/06/14 98 lb 12.8 oz (44.815 kg)  11/02/14 96 lb 6.4 oz (43.727 kg)  10/23/14 99 lb 12.8 oz (45.269 kg)  10/17/14 103 lb 3.2 oz (46.811 kg)  10/11/14 102 lb 1.6 oz (46.312 kg)  09/26/14 101 lb 3.2 oz (45.904 kg)  12/08/13 168 lb 10.4 oz (76.5 kg)    Usual Body Weight: 123 lbs  % Usual Body Weight: 80%  BMI:  Body mass index is 18.68 kg/(m^2).  Estimated Nutritional Needs: Kcal: 1200-1400 Protein: 65-80 g Fluid: 1.4 L/day  Skin: intact  Diet Order: Diet Carb Modified  EDUCATION NEEDS: -Education needs addressed   Intake/Output Summary (Last 24 hours) at 11/06/14 1406 Last data filed at 11/06/14 0959  Gross per 24 hour  Intake     60 ml  Output      0 ml  Net     60 ml    Last BM: 12/1   Labs:   Recent Labs Lab 11/02/14 1153 11/05/14 1727 11/06/14 0617  NA 134* 133* 135*  K 4.1 4.5 4.2  CL  --  95* 98  CO2 25 25 20   BUN 19.4 20 18   CREATININE 0.8 0.47* 0.46*  CALCIUM 10.0 10.0 8.9  GLUCOSE 372* 165* 175*    CBG (last 3)   Recent Labs  11/05/14 2326 11/06/14 0744 11/06/14 1231  GLUCAP 129*  173* 223*    Scheduled Meds: . aztreonam  1 g Intravenous 3 times per day  . feeding supplement (ENSURE COMPLETE)  237 mL Oral BID BM  . insulin aspart  0-9 Units Subcutaneous TID WC  . insulin detemir  4 Units Subcutaneous QHS  . lisinopril  10 mg Oral Daily  . metoCLOPramide  5 mg Oral TID AC  . multivitamin with minerals  1 tablet Oral Daily  . sodium chloride  3 mL Intravenous Q12H  . sorbitol  30 mL Oral BID  . [START ON 11/07/2014] vancomycin  500 mg Intravenous Q24H    Continuous Infusions: . sodium chloride 125 mL/hr at 11/05/14 2200    Past Medical History  Diagnosis Date  . Type II diabetes mellitus     "just  dx'd today' (12/08/2013)  . Hypertension   . Acute renal failure   . DKA (diabetic ketoacidoses)   . Pancreatic cancer 2015    Past Surgical History  Procedure Laterality Date  . Tubal ligation    . Dental surgery    . Eus N/A 09/28/2014    Procedure: UPPER ENDOSCOPIC ULTRASOUND (EUS) LINEAR;  Surgeon: Beryle Beams, MD;  Location: [facility];  Service: Endoscopy;  Laterality: N/A;    Laurette Schimke MS, RD, LDN

## 2014-11-06 NOTE — Care Management Note (Addendum)
    Page 1 of 2   11/08/2014     12:38:53 PM CARE MANAGEMENT NOTE 11/08/2014  Patient:  Debra [name], Debra [name]   Account Number:  [id number]  Date Initiated:  11/06/2014  Documentation initiated by:  Tomi Bamberger  Subjective/Objective Assessment:   dx pancreatic ca, septic,     Action/Plan:   pt eval-hhpt  palliative- home with hospice.   Anticipated DC Date:  11/08/2014   Anticipated DC Plan:  [place]  CM consult      PAC Choice  HOSPICE   Choice offered to / List presented to:  C-4 Adult Children   DME arranged  3-N-1  Vassie Moselle      DME agency  [place] arranged  HH-1 RN      [facility] agency  [facility] [place]   Status of service:  Completed, signed off Medicare Important Message given?  YES (If response is "NO", the following Medicare IM given date fields will be blank) Date Medicare IM given:  11/08/2014 Medicare IM given by:  Tomi Bamberger Date Additional Medicare IM given:   Additional Medicare IM given by:    Discharge Disposition:  [place]  Per UR Regulation:  Reviewed for med. necessity/level of care/duration of stay  If discussed at [place] of Stay Meetings, dates discussed:    Comments:  11/08/14 [place], BSN [phone] patient is for dc today, Margie with [facility] has spoken with daughter and patient, and [facility] will deliver DME to patient's home.  [name] will take patient home by car.  11/07/14 [place], BSN [phone] NCM spoke with patient's daughter in the room , she asked if NCM could come back in 30 minutes she needed to discuss with her sister who 's home patient will be going to at dc. NCM gave daughter home hopice list to look over for choice. NCM came back to speak with daughter she chose [facility], referral made to [facility] with [facility].  Per daughter patient will be transported by car.  Patient does not want a hospital bed and she  will discuss the rest of DME with [facility].  Plan for dc 11/08/14.  11/06/14 [place] RN, BSN [phone] patient is from home, NCM will cont to follow for dc needs. Await pt eval.

## 2014-11-06 NOTE — Progress Notes (Signed)
PROGRESS NOTE  Debra [name] TOI:[id number] DOB: [birth year] DOA: 11/05/2014 PCP: Rodena Medin, MD  HPI: is a 73 y.o. female with PMH of pancreatic cancer, diabetes mellitus, hypertension, anemia of chronic disease, who presents with a fall. Patient was recently diagnosed with pancreatic cancer. Currently she is followed up by Dr. Benay Spice. She is getting chemotherapy and received chemotherapy 3 days prior to admission.   Subjective/ 24 H Interval events Feeling weak today  Assessment/Plan: Principal Problem:   Fall Active Problems:   HTN (hypertension)   Leukocytosis   Anemia of chronic disease   Protein-calorie malnutrition, severe   Uncontrolled diabetes mellitus with complications   Cancer of pancreas, tail    Fall: it is most likely caused by combination of possible orthostatic status and generalized deconditioning. Patient has nausea and vomiting which are likely secondary to chemotherapy, making dehydration and orthostatic status very likely. In addition, patient has worsening anemia, which may have also contributed. Patient did not have any alarming symptoms before the event happened, such as palpitation, chest pain, seizure and unilateral weakness or numbness - still tachycardic this morning  Sinus tachycardia  - multifactorial cannot exclude underlying infection, possible contributing also tumor burden, dehydration, anemia  - continue hydration, favor empiric antibiotics which can be d/c if cultures remain negative  Leukocytosis: Etiology is not clear. Given the patient has [employer] and being treated with chemotherapy, it is important to rule out infection - antibiotics as above  Anemia of chronic disease: Hgb dropped from 8.3 to 7.1. - FOBT pending - large tumor burden, ?luminal invasion - transfuse 2U pRBC today  DM-II: Last A1c was 8.4 on 09/26/14. On Levemir 6 units at home daily -Decreased Levemir from 6 to 4 units daily -SSI  Severe protein-capillary  mulnutrition: Secondary to pancreatic cancer  - Ensure  Stage IV metastatic Pancreatic cancer: Followed up by Dr. Benay Spice. receiving chemotherapy - very poor prognosis overall, discussed briefly today with Dr. Benay Spice  - patient unsure if she wishes more chemo and will defer to Dr. Benay Spice - may need hospice    Diet: carb modified Fluids: NS DVT Prophylaxis: SCD  Code Status: Full Family Communication: d/w daughter bedside  Disposition Plan: inpatient  Consultants:  None   Procedures:  None    Antibiotics  Anti-infectives    None       Studies  Dg Chest 2 View  11/05/2014   CLINICAL DATA:  Fall with dizziness and left-sided pain. Initial encounter. Metastatic pancreatic cancer.  EXAM: CHEST  2 VIEW  COMPARISON:  12/08/2013 chest radiographs  FINDINGS: The cardiomediastinal silhouette is unremarkable.  A right central venous catheter is identified with tip overlying the superior cavoatrial junction.  Calcified granulomas in the right lower lung again noted.  There is no evidence of focal airspace disease, pulmonary edema, suspicious pulmonary nodule/mass, pleural effusion, or pneumothorax. No acute bony abnormalities are identified.  IMPRESSION: No active cardiopulmonary disease.   Electronically Signed   By: Hassan Rowan M.D.   On: 11/05/2014 19:01   Ct Head Wo Contrast  11/05/2014   CLINICAL DATA:  Fall this morning, hit head.  EXAM: CT HEAD WITHOUT CONTRAST  TECHNIQUE: Contiguous axial images were obtained from the base of the skull through the vertex without intravenous contrast.  COMPARISON:  None.  FINDINGS: No evidence of an acute infarct, acute hemorrhage, mass lesion, mass effect or hydrocephalus. Mild atrophy. Mild periventricular low attenuation. Visualized portions of the paranasal sinuses and mastoid air cells are clear. No  fracture.  IMPRESSION: 1. No acute intracranial abnormality. 2. Mild atrophy and mild chronic microvascular white matter ischemic changes.    Electronically Signed   By: Lorin Picket M.D.   On: 11/05/2014 18:53   Objective  Filed Vitals:   11/05/14 2215 11/05/14 2259 11/06/14 0514 11/06/14 0519  BP: 148/77 150/75    Pulse: 119 123    Temp:  98.3 F (36.8 C) 98.8 F (37.1 C)   TempSrc:  Oral Oral   Resp: 10 16 28    Height:  5\' 1"  (1.549 m)    Weight:  44.997 kg (99 lb 3.2 oz)  44.815 kg (98 lb 12.8 oz)  SpO2: 99% 99% 100%    No intake or output data in the 24 hours ending 11/06/14 0738 Filed Weights   11/05/14 2259 11/06/14 0519  Weight: 44.997 kg (99 lb 3.2 oz) 44.815 kg (98 lb 12.8 oz)    Exam:  General:  NAD  Cardiovascular: RRR  Respiratory: CTA biL  Abdomen: soft, mild diffuse tenderness   MSK: no edema  Neuro: non focal   Data Reviewed: Basic Metabolic Panel:  Recent Labs Lab 11/02/14 1153 11/05/14 1727 11/06/14 0617  NA 134* 133* 135*  K 4.1 4.5 4.2  CL  --  95* 98  CO2 25 25 20   GLUCOSE 372* 165* 175*  BUN 19.4 20 18   CREATININE 0.8 0.47* 0.46*  CALCIUM 10.0 10.0 8.9   Liver Function Tests:  Recent Labs Lab 11/02/14 1153 11/05/14 1727  AST 17 22  ALT 15 20  ALKPHOS 289* 234*  BILITOT 0.32 0.5  PROT 7.3 7.0  ALBUMIN 2.2* 1.9*   CBC:  Recent Labs Lab 11/02/14 1153 11/05/14 1727 11/05/14 2350 11/06/14 0617  WBC 16.2* 14.8* 14.0* 12.0*  NEUTROABS 13.5* 13.6*  --   --   HGB 8.3* 7.1* 7.2* 6.7*  HCT 25.5* 21.8* 22.1* 19.9*  MCV 82.8 81.3 83.7 83.6  PLT 473* 405* 373 374   CBG:  Recent Labs Lab 11/05/14 1753 11/05/14 2326  GLUCAP 155* 129*   Scheduled Meds: . sodium chloride   Intravenous Once  . feeding supplement (ENSURE COMPLETE)  237 mL Oral BID BM  . heparin  5,000 Units Subcutaneous 3 times per day  . insulin aspart  0-9 Units Subcutaneous TID WC  . insulin detemir  4 Units Subcutaneous QHS  . lisinopril  10 mg Oral Daily  . metoCLOPramide  5 mg Oral TID AC  . multivitamin with minerals  1 tablet Oral Daily  . sodium chloride  3 mL Intravenous Q12H    . sorbitol  30 mL Oral BID   Continuous Infusions: . sodium chloride 125 mL/hr at 11/05/14 2200    Time spent: 35 minutes  Marzetta Board, MD [facility] Pager [phone]. If 7 PM - 7 AM, please contact night-coverage at [email or web address], password [facility] 11/06/2014, 7:38 AM  LOS: 1 day

## 2014-11-06 NOTE — Progress Notes (Signed)
OT Cancellation Note  Patient Details Name: Debra [name] MRN: [id number] DOB: 12/10/1940   Cancelled Treatment:    Reason Eval/Treat Not Completed: Other (comment) Pt on bedrest. Please update activity orders when appropriate to begin OT/PT. Thank you. [place], OTR/L  [phone] 11/06/2014 11/06/2014, 5:12 PM

## 2014-11-06 NOTE — Progress Notes (Signed)
PT Cancellation Note  Patient Details Name: Debra [name] MRN: [id number] DOB: [birth year]   Cancelled Treatment:    Reason Eval/Treat Not Completed: Patient not medically ready Orders for PT evaluation acknowledged. Patient currently on strict bed rest orders. Attending MD paged. Will await update in activity status and follow up when patient is medically ready.  Ellouise Newer 11/06/2014, 8:52 AM  Elayne Snare, [place]

## 2014-11-06 NOTE — Progress Notes (Signed)
Utilization review completed.  

## 2014-11-06 NOTE — Plan of Care (Signed)
Problem: Phase I Progression Outcomes Goal: Pain controlled with appropriate interventions Outcome: Completed/Met Date Met:  11/06/14     

## 2014-11-07 ENCOUNTER — Telehealth: Payer: Self-pay | Admitting: *Deleted

## 2014-11-07 DIAGNOSIS — D638 Anemia in other chronic diseases classified elsewhere: Secondary | ICD-10-CM

## 2014-11-07 DIAGNOSIS — R630 Anorexia: Secondary | ICD-10-CM

## 2014-11-07 DIAGNOSIS — C251 Malignant neoplasm of body of pancreas: Secondary | ICD-10-CM

## 2014-11-07 DIAGNOSIS — G893 Neoplasm related pain (acute) (chronic): Secondary | ICD-10-CM

## 2014-11-07 DIAGNOSIS — R110 Nausea: Secondary | ICD-10-CM

## 2014-11-07 DIAGNOSIS — Z66 Do not resuscitate: Secondary | ICD-10-CM

## 2014-11-07 DIAGNOSIS — C787 Secondary malignant neoplasm of liver and intrahepatic bile duct: Secondary | ICD-10-CM

## 2014-11-07 DIAGNOSIS — D649 Anemia, unspecified: Secondary | ICD-10-CM

## 2014-11-07 DIAGNOSIS — I10 Essential (primary) hypertension: Secondary | ICD-10-CM

## 2014-11-07 DIAGNOSIS — C252 Malignant neoplasm of tail of pancreas: Secondary | ICD-10-CM

## 2014-11-07 DIAGNOSIS — R634 Abnormal weight loss: Secondary | ICD-10-CM

## 2014-11-07 LAB — CBC WITH DIFFERENTIAL/PLATELET
BASOS ABS: 0 10*3/uL (ref 0.0–0.1)
BASOS PCT: 0 % (ref 0–1)
EOS ABS: 0.1 10*3/uL (ref 0.0–0.7)
Eosinophils Relative: 1 % (ref 0–5)
HEMATOCRIT: 28.4 % — AB (ref 36.0–46.0)
Hemoglobin: 9.7 g/dL — ABNORMAL LOW (ref 12.0–15.0)
Lymphocytes Relative: 15 % (ref 12–46)
Lymphs Abs: 1 10*3/uL (ref 0.7–4.0)
MCH: 27.6 pg (ref 26.0–34.0)
MCHC: 34.2 g/dL (ref 30.0–36.0)
MCV: 80.9 fL (ref 78.0–100.0)
MONO ABS: 0.3 10*3/uL (ref 0.1–1.0)
Monocytes Relative: 5 % (ref 3–12)
Neutro Abs: 5.3 10*3/uL (ref 1.7–7.7)
Neutrophils Relative %: 80 % — ABNORMAL HIGH (ref 43–77)
PLATELETS: 299 10*3/uL (ref 150–400)
RBC: 3.51 MIL/uL — ABNORMAL LOW (ref 3.87–5.11)
RDW: 14 % (ref 11.5–15.5)
WBC: 6.7 10*3/uL (ref 4.0–10.5)

## 2014-11-07 LAB — GLUCOSE, CAPILLARY
Glucose-Capillary: 138 mg/dL — ABNORMAL HIGH (ref 70–99)
Glucose-Capillary: 222 mg/dL — ABNORMAL HIGH (ref 70–99)
Glucose-Capillary: 225 mg/dL — ABNORMAL HIGH (ref 70–99)

## 2014-11-07 LAB — URINE CULTURE

## 2014-11-07 LAB — TYPE AND SCREEN
ABO/RH(D): O POS
Antibody Screen: NEGATIVE
Unit division: 0
Unit division: 0

## 2014-11-07 LAB — COMPREHENSIVE METABOLIC PANEL
ALT: 22 U/L (ref 0–35)
ANION GAP: 12 (ref 5–15)
AST: 19 U/L (ref 0–37)
Albumin: 1.7 g/dL — ABNORMAL LOW (ref 3.5–5.2)
Alkaline Phosphatase: 248 U/L — ABNORMAL HIGH (ref 39–117)
BILIRUBIN TOTAL: 0.6 mg/dL (ref 0.3–1.2)
BUN: 14 mg/dL (ref 6–23)
CHLORIDE: 102 meq/L (ref 96–112)
CO2: 22 meq/L (ref 19–32)
CREATININE: 0.43 mg/dL — AB (ref 0.50–1.10)
Calcium: 8.9 mg/dL (ref 8.4–10.5)
GFR calc Af Amer: >90 mL/min (ref >90)
Glucose, Bld: 144 mg/dL — ABNORMAL HIGH (ref 70–99)
Potassium: 3.7 mEq/L (ref 3.7–5.3)
Sodium: 136 mEq/L — ABNORMAL LOW (ref 137–147)
Total Protein: 6.2 g/dL (ref 6.0–8.3)

## 2014-11-07 MED ORDER — LABETALOL HCL 5 MG/ML IV SOLN
10.0000 mg | INTRAVENOUS | Status: DC | PRN
  Administered 2014-11-07 (×2): 10 mg via INTRAVENOUS
  Filled 2014-11-07 (×3): qty 4

## 2014-11-07 MED ORDER — SODIUM CHLORIDE 0.9 % IJ SOLN
10.0000 mL | INTRAMUSCULAR | Status: DC | PRN
  Administered 2014-11-07 – 2014-11-08 (×2): 10 mL
  Filled 2014-11-07 (×2): qty 40

## 2014-11-07 NOTE — Progress Notes (Signed)
PROGRESS NOTE  Debra [name] KNL:[id number] DOB: 1941/08/26 DOA: 11/05/2014 PCP: Rodena Medin, MD  HPI: is a 73 y.o. female with PMH of pancreatic cancer, diabetes mellitus, hypertension, anemia of chronic disease, who presents with a fall. Patient was recently diagnosed with pancreatic cancer. Currently she is followed up by Dr. Benay Spice. She is getting chemotherapy and received chemotherapy 3 days prior to admission.   Subjective/ 24 H Interval events Voicing no complaints  Assessment/Plan: Principal Problem:   Fall Active Problems:   HTN (hypertension)   Leukocytosis   Anemia of chronic disease   Protein-calorie malnutrition, severe   Uncontrolled diabetes mellitus with complications   Cancer of pancreas, tail   Pancreatic carcinoma metastatic to liver    Fall: it is most likely caused by combination of possible orthostatic status and generalized deconditioning. Patient has nausea and vomiting which are likely secondary to chemotherapy, making dehydration and orthostatic status very likely. In addition, patient has worsening anemia, which may have also contributed. Patient did not have any alarming symptoms before the event happened, such as palpitation, chest pain, seizure and unilateral weakness or numbness - still tachycardic  Sinus tachycardia  - multifactorial cannot exclude underlying infection, possible contributing also tumor burden, dehydration, anemia  - chronic  Leukocytosis: Etiology is not clear, no apparent infectious source, resolved.  - antibiotics as above  Anemia of chronic disease: Hgb dropped from 8.3 to 7.1. - FOBT pending - large tumor burden, ?luminal invasion - transfuse 2U pRBC today  DM-II: Last A1c was 8.4 on 09/26/14. On Levemir 6 units at home daily -Decreased Levemir from 6 to 4 units daily -SSI  Severe protein-capillary mulnutrition: Secondary to pancreatic cancer  - Ensure  Stage IV metastatic Pancreatic cancer: Followed up by Dr.  Benay Spice, I discussed with him this morning  Goals of care - I had a long discussion with the patient and her daughter today regarding her overall condition. She has a terminal illness and she is not a candidate for further chemotherapy per Dr. Benay Spice. We have addressed her code status and concept of hospice care. Per patient's and family wishes, will set up home hospice services and anticipate discharge tomorrow. She is not DNR.  Diet: carb modified Fluids: NS DVT Prophylaxis: SCD  Code Status: Full Family Communication: d/w daughter bedside  Disposition Plan: inpatient  Consultants:  None   Procedures:  None    Antibiotics  Anti-infectives    Start     Dose/Rate Route Frequency Ordered Stop   11/07/14 0600  vancomycin (VANCOCIN) 500 mg in sodium chloride 0.9 % 100 mL IVPB     500 mg100 mL/hr over 60 Minutes Intravenous Every 24 hours 11/06/14 0757     11/06/14 0830  vancomycin (VANCOCIN) IVPB 1000 mg/200 mL premix     1,000 mg200 mL/hr over 60 Minutes Intravenous  Once 11/06/14 0757 11/06/14 1017   11/06/14 0830  aztreonam (AZACTAM) 1 g in dextrose 5 % 50 mL IVPB     1 g100 mL/hr over 30 Minutes Intravenous 3 times per day 11/06/14 0757         Studies  No results found. Objective  Filed Vitals:   11/06/14 2135 11/07/14 0556 11/07/14 0955 11/07/14 1258  BP: 161/78 160/83 171/95 174/100  Pulse: 128 120    Temp: 98.9 F (37.2 C) 98.8 F (37.1 C)    TempSrc: Oral Oral    Resp: 22 19    Height:      Weight:  SpO2: 100% 100%      Intake/Output Summary (Last 24 hours) at 11/07/14 1312 Last data filed at 11/06/14 1845  Gross per 24 hour  Intake   2045 ml  Output      0 ml  Net   2045 ml   Filed Weights   11/05/14 2259 11/06/14 0519  Weight: 44.997 kg (99 lb 3.2 oz) 44.815 kg (98 lb 12.8 oz)    Exam:  General:  NAD  Cardiovascular: RRR  Respiratory: CTA biL  Abdomen: soft, mild diffuse tenderness   MSK: no edema  Neuro: non focal   Data  Reviewed: Basic Metabolic Panel:  Recent Labs Lab 11/02/14 1153 11/05/14 1727 11/06/14 0617 11/07/14 0431  NA 134* 133* 135* 136*  K 4.1 4.5 4.2 3.7  CL  --  95* 98 102  CO2 25 25 20 22   GLUCOSE 372* 165* 175* 144*  BUN 19.4 20 18 14   CREATININE 0.8 0.47* 0.46* 0.43*  CALCIUM 10.0 10.0 8.9 8.9   Liver Function Tests:  Recent Labs Lab 11/02/14 1153 11/05/14 1727 11/07/14 0431  AST 17 22 19   ALT 15 20 22   ALKPHOS 289* 234* 248*  BILITOT 0.32 0.5 0.6  PROT 7.3 7.0 6.2  ALBUMIN 2.2* 1.9* 1.7*   CBC:  Recent Labs Lab 11/02/14 1153 11/05/14 1727 11/05/14 2350 11/06/14 0617 11/07/14 0431  WBC 16.2* 14.8* 14.0* 12.0* 6.7  NEUTROABS 13.5* 13.6*  --   --  5.3  HGB 8.3* 7.1* 7.2* 6.7* 9.7*  HCT 25.5* 21.8* 22.1* 19.9* 28.4*  MCV 82.8 81.3 83.7 83.6 80.9  PLT 473* 405* 373 374 299   CBG:  Recent Labs Lab 11/06/14 1231 11/06/14 1644 11/06/14 2125 11/07/14 0809 11/07/14 1225  GLUCAP 223* 186* 190* 138* 222*   Scheduled Meds: . aztreonam  1 g Intravenous 3 times per day  . feeding supplement (ENSURE)  1 Container Oral BID BM  . insulin aspart  0-9 Units Subcutaneous TID WC  . insulin detemir  4 Units Subcutaneous QHS  . lactose free nutrition  237 mL Oral BID BM  . lisinopril  20 mg Oral Daily  . metoCLOPramide  5 mg Oral TID AC  . multivitamin with minerals  1 tablet Oral Daily  . sodium chloride  3 mL Intravenous Q12H  . sorbitol  30 mL Oral BID  . vancomycin  500 mg Intravenous Q24H   Continuous Infusions: . sodium chloride 125 mL/hr at 11/06/14 1852   Time spent: 35 minutes more than 50% in discussing plan and goals of care with patient as well as Oncology.  Marzetta Board, MD [facility] Pager [phone]. If 7 PM - 7 AM, please contact night-coverage at [email or web address], password [facility] 11/07/2014, 1:12 PM  LOS: 2 days

## 2014-11-07 NOTE — Telephone Encounter (Signed)
Call from [facility] with hospice asking if Dr. Benay Spice will be attending for hospice? YES.

## 2014-11-07 NOTE — Progress Notes (Signed)
OT Cancellation Note  Patient Details Name: Debra [name] MRN: [id number] DOB: 1941/08/04   Cancelled Treatment:    Reason Eval/Treat Not Completed: Fatigue/lethargy limiting ability to participate  Will check on pt next day   Betsy Pries 11/07/2014, 1:36 PM

## 2014-11-07 NOTE — Progress Notes (Signed)
Notified by Ardelle Lesches, patient and family request services of [facility]) after discharge. Patient information reviewed with Dr Alferd Patee, [place] Director hospice eligible with dx: Pancreatic Cancer. Spoke with patient and daughter [name] at bedside to initiate education related to hospice services, philosophy and team approach to care; pt slept off and on during this visit denied any discomfort; daughter voiced good understanding of information provided.  Daughter shared prior to this hospitalization she and Ms Altice spent time between the patient's home in [place] and her daughter's home in [place]; however at discharge the plan is to go to Ms [place] home in [place]. [name] stated Ms statoin has been having trouble sleeping through the night and often wakes at 2-3 am and is up until 8/9 am and sleeps during the day she asks about medication to help with this and appetite; Probation officer discussed with attending Dr Cruzita Lederer who is considering adding Remeron. Per discussion plan is to d/c by personal vehicle when medically ready - possibly tomorrow, Thursday. *Please send completed GOLD DNR Form home with pt  DME needs discussed; daughter is waitng to find out if pt currently has a BSC and walker at the home if not these will be ordered -writer will follow up with daughter in the morning.  Initial paperwork faxed to [facility]  Please notify [facility] when patient is ready to leave unit at d/c call [phone] (or [phone] if after 5 pm); [facility] information and contact numbers also given to daughter [name] during visit.   Above information shared with [facility] Neoma Laming Please call with any questions or concerns   Danton Sewer, RN 11/07/2014, 4:49 PM [facility] Liaison [phone]

## 2014-11-07 NOTE — Plan of Care (Signed)
Problem: Phase II Progression Outcomes Goal: Discharge plan established Outcome: Completed/Met Date Met:  11/07/14  Problem: Phase III Progression Outcomes Goal: Foley discontinued Outcome: Not Applicable Date Met:  67/89/38 Goal: Discharge plan remains appropriate-arrangements made Outcome: Completed/Met Date Met:  11/07/14  Problem: Discharge Progression Outcomes Goal: Tolerating diet Outcome: Completed/Met Date Met:  11/07/14

## 2014-11-07 NOTE — Progress Notes (Signed)
IP PROGRESS NOTE  Subjective:   Debra [name] is known to me with a history of metastatic pancreas cancer. She was treated with gemcitabine chemotherapy 11/02/2014. She reports tolerating the chemotherapy well. She was admitted 11/06/2014 after a fall. She reports recent dark stools.  She reports upper abdominal pain.  Objective: Vital signs in last 24 hours: Blood pressure 174/100, pulse 120, temperature 98.8 F (37.1 C), temperature source Oral, resp. rate 19, height 5\' 1"  (1.549 m), weight 98 lb 12.8 oz (44.815 kg), SpO2 100 %.  Intake/Output from previous day: 12/01 0701 - 12/02 0700 In: 2105 [P.O.:60; I.V.:1375; Blood:670] Out: -   Physical Exam:  Lungs: Clear bilaterally Cardiac: Tachycardia, regular rhythm Abdomen: Mass in the mid upper abdomen, no apparent ascites Extremities: No leg edema   Portacath/PICC-without erythema  Lab Results:  Recent Labs  11/06/14 0617 11/07/14 0431  WBC 12.0* 6.7  HGB 6.7* 9.7*  HCT 19.9* 28.4*  PLT 374 299    BMET  Recent Labs  11/06/14 0617 11/07/14 0431  NA 135* 136*  K 4.2 3.7  CL 98 102  CO2 20 22  GLUCOSE 175* 144*  BUN 18 14  CREATININE 0.46* 0.43*  CALCIUM 8.9 8.9    Studies/Results: Dg Chest 2 View  11/05/2014   CLINICAL DATA:  Fall with dizziness and left-sided pain. Initial encounter. Metastatic pancreatic cancer.  EXAM: CHEST  2 VIEW  COMPARISON:  12/08/2013 chest radiographs  FINDINGS: The cardiomediastinal silhouette is unremarkable.  A right central venous catheter is identified with tip overlying the superior cavoatrial junction.  Calcified granulomas in the right lower lung again noted.  There is no evidence of focal airspace disease, pulmonary edema, suspicious pulmonary nodule/mass, pleural effusion, or pneumothorax. No acute bony abnormalities are identified.  IMPRESSION: No active cardiopulmonary disease.   Electronically Signed   By: Hassan Rowan M.D.   On: 11/05/2014 19:01   Ct Head Wo  Contrast  11/05/2014   CLINICAL DATA:  Fall this morning, hit head.  EXAM: CT HEAD WITHOUT CONTRAST  TECHNIQUE: Contiguous axial images were obtained from the base of the skull through the vertex without intravenous contrast.  COMPARISON:  None.  FINDINGS: No evidence of an acute infarct, acute hemorrhage, mass lesion, mass effect or hydrocephalus. Mild atrophy. Mild periventricular low attenuation. Visualized portions of the paranasal sinuses and mastoid air cells are clear. No fracture.  IMPRESSION: 1. No acute intracranial abnormality. 2. Mild atrophy and mild chronic microvascular white matter ischemic changes.   Electronically Signed   By: Lorin Picket M.D.   On: 11/05/2014 18:53    Medications: I have reviewed the patient's current medications.  Assessment/Plan: 1. 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 Pain secondary to #1 3. Nausea secondary to #1 4. Diabetes 5. Anorexia/weight loss 6. Port-A-Cath placement 10/16/2014 7. Constipation 8. Tachycardia, multifactorial secondary to deconditioning, anemia, metastatic tumor burden. 9. Fecal impaction 10/23/2014. Relieved with enemas. 10. Severe anemia-most likely related to chronic disease and I suspect she has tumor invading the GI tract with associated bleeding. 11. Status post a fall on 11/06/2014  Debra [name] continues to have a poor performance status. She has metastatic pancreas cancer. I do not recommend further chemotherapy. I discussed Hospice care with her today. Her daughter is not present this morning.  Recommendations: 1. [facility] referral for home care. 2. I am available to discuss the Hospice recommendation with her daughter.  I will check on her in the a.m. on 11/08/2014.    LOS: 2 days   [place]  11/07/2014, 1:44 PM

## 2014-11-07 NOTE — Evaluation (Signed)
Physical Therapy Evaluation Patient Details Name: Debra [name] MRN: [id number] DOB: [birth year] Today's Date: 11/07/2014   History of Present Illness  Debra [name] is a 73 y.o. female presents with abdominal pain and pancreatic mass.  Now s/p endoscopy w/biopsy.  Clinical Impression  Pt currently with functional limitations due to decreased strength, balance, mobility and endurance. Pt will benefit from skilled PT to increase independence and safety with mobility to allow discharge to home with HHPT. Pt able to perform safe bed mobility and transfer to standing but requires min to moderate assistance once on feet due to anterior and left leaning. Pt unsteady and has significant anterior lean with ambulation without use of AD. Prior to ambulation, pt's BP taken 2 times (see vitals section below) and averaged in the 170s/90s. Pt's nurse was informed of high BP. Pt spoke with pt's daughter about recommendation for RW at discharge and HHPT for further therapy to address strength and balance for safe mobility. Pt's daughter stated she would talk to pt and see if she was agreeable to these recommendations.      Follow Up Recommendations Home health PT    Equipment Recommendations  Rolling walker with 5" wheels    Recommendations for Other Services       Precautions / Restrictions Precautions Precautions: Fall Restrictions Weight Bearing Restrictions: No      Mobility  Bed Mobility Overal bed mobility: Modified Independent             General bed mobility comments: Pt able to transfer supine to sit with HOB elevated and increased time. No physical assistance or cuing from PT.   Transfers Overall transfer level: Needs assistance Equipment used: None Transfers: Sit to/from Stand Sit to Stand: Min guard         General transfer comment: Pt able to scoot forward to get legs on floor prior to transfer and was able to sequence transition without cuing. Pt required min guard for  safety and patient had Left/Anterior lean in standing.   Ambulation/Gait Ambulation/Gait assistance: Min assist;Mod assist Ambulation Distance (Feet): 25 Feet Assistive device: None Gait Pattern/deviations: Step-through pattern;Shuffle;Narrow base of support;Trunk flexed;Staggering left;Staggering right   Gait velocity interpretation: Below normal speed for age/gender General Gait Details: Pt would benefit from attempting to use RW at next visit. Pt had quick, shuffling movement of LEand forward lean with ambulation as if she was trying get her legs to catch up to her body. Pt was fatigued by end ambulation.   Stairs            Wheelchair Mobility    Modified Rankin (Stroke Patients Only)       Balance Overall balance assessment: Needs assistance Sitting-balance support: Feet unsupported;No upper extremity supported Sitting balance-Leahy Scale: Good Sitting balance - Comments: Able to sit EOB for 15 minutes while doctor spoke to her without support. Able to scoot forward on bed to get feet on floor.    Standing balance support: During functional activity;No upper extremity supported Standing balance-Leahy Scale: Fair Standing balance comment: Pt unsafe with ambulation without AD.                              Pertinent Vitals/Pain Pain Assessment: Faces Faces Pain Scale: Hurts a little bit Pain Location: "where mass is" - pointed to chest Pain Intervention(s): Monitored during session;Limited activity within patient's tolerance  Pre-ambulation - BP #1: 165/103   HR #1: 127 - taken with adult  cuff in room Pre-ambulation - BP #2: 177/98   HR #2: 118 - taken with pediatric cuff Post- ambulation - BP #3: 173/96   HR #3: 115 - taken with pediatric cuff    Home Living Family/patient expects to be discharged to:: Private residence Living Arrangements: Children Available Help at Discharge: Family;Available 24 hours/day Type of Home: House Home Access: Stairs to  enter Entrance Stairs-Rails: None Entrance Stairs-Number of Steps: 2 Home Layout: Two level;Able to live on main level with bedroom/bathroom Home Equipment: None Additional Comments: Pt lives at daughter's home M-F and then daughter and pt go to pt's home F-Su. Pt's daughter works from home but stated pt will have someone with her almost all the time. Pt does not have to go up stairs in either household.     Prior Function Level of Independence: Independent               Hand Dominance   Dominant Hand: Right    Extremity/Trunk Assessment               Lower Extremity Assessment: Overall WFL for tasks assessed         Communication   Communication: No difficulties  Cognition Arousal/Alertness: Awake/alert Behavior During Therapy: WFL for tasks assessed/performed Overall Cognitive Status: Within Functional Limits for tasks assessed                      General Comments General comments (skin integrity, edema, etc.): Pt's physician came in during treatment and spoke with patient for 14 minutes.     Exercises        Assessment/Plan    PT Assessment Patient needs continued PT services  PT Diagnosis Difficulty walking   PT Problem List Decreased strength;Decreased activity tolerance;Decreased balance;Decreased mobility;Decreased coordination;Decreased knowledge of use of DME;Decreased safety awareness  PT Treatment Interventions DME instruction;Gait training;Functional mobility training;Therapeutic activities;Therapeutic exercise;Balance training;Patient/family education   PT Goals (Current goals can be found in the Care Plan section) Acute Rehab PT Goals Patient Stated Goal: None stated PT Goal Formulation: With patient Time For Goal Achievement: 11/21/14 Potential to Achieve Goals: Good    Frequency Min 3X/week   Barriers to discharge        Co-evaluation               End of Session Equipment Utilized During Treatment: Gait  belt Activity Tolerance: Patient limited by fatigue Patient left: in bed;with call bell/phone within reach;with family/visitor present Nurse Communication: Other (comment) (Elevated blood pressure)         Time: 6389-3734 PT Time Calculation (min) (ACUTE ONLY): 25 min   Charges:   PT Evaluation $Initial PT Evaluation Tier I: 1 Procedure PT Treatments $Gait Training: 8-22 mins   PT G CodesJearld Shines SPT 11/07/2014, 12:53 PM  Jearld Shines, [facility]  Acute Rehabilitation [phone]

## 2014-11-07 NOTE — Plan of Care (Signed)
Problem: Phase II Progression Outcomes Goal: Progress activity as tolerated unless otherwise ordered Outcome: Completed/Met Date Met:  11/07/14 Goal: Obtain order to discontinue catheter if appropriate Outcome: Not Applicable Date Met:  09/32/67

## 2014-11-08 DIAGNOSIS — Z7189 Other specified counseling: Secondary | ICD-10-CM

## 2014-11-08 DIAGNOSIS — Z66 Do not resuscitate: Secondary | ICD-10-CM

## 2014-11-08 LAB — GLUCOSE, CAPILLARY
GLUCOSE-CAPILLARY: 105 mg/dL — AB (ref 70–99)
Glucose-Capillary: 179 mg/dL — ABNORMAL HIGH (ref 70–99)
Glucose-Capillary: 164 mg/dL — ABNORMAL HIGH (ref 70–99)

## 2014-11-08 MED ORDER — MIRTAZAPINE 15 MG PO TABS: 15.0000 mg | ORAL_TABLET | Freq: Every day | ORAL | Status: DC

## 2014-11-08 MED ORDER — HEPARIN SOD (PORK) LOCK FLUSH 100 UNIT/ML IV SOLN
500.0000 [IU] | INTRAVENOUS | Status: AC | PRN
  Administered 2014-11-08: 500 [IU]

## 2014-11-08 MED ORDER — LEVOFLOXACIN 500 MG PO TABS: 500.0000 mg | ORAL_TABLET | Freq: Every day | ORAL | Status: DC

## 2014-11-08 MED ORDER — HYDROCODONE-ACETAMINOPHEN 5-300 MG PO TABS: 1.0000 | ORAL_TABLET | Freq: Four times a day (QID) | ORAL | Status: DC | PRN

## 2014-11-08 NOTE — Evaluation (Signed)
Occupational Therapy Evaluation Patient Details Name: Debra [name] MRN: [id number] DOB: [birth year] Today's Date: 11/08/2014    History of Present Illness Debra [name] is a 73 y.o. female presents with abdominal pain and pancreatic mass.  Now s/p endoscopy w/biopsy.   Clinical Impression   Pt currently presents at a min assist level for functional transfers and selfcare tasks sit to stand.  Recommend 24 hour assistance at discharge for safety as pt is a high fall risk.  Will have 3:1 and RW for home.  Discussed need for tub bench and HHOT as well.  Pt and family will hold off on both at this time but may request later after she gets home.  No further acute care OT needs.  Pt and daughter appreciative of OT visit and recommendations.     Follow Up Recommendations  No OT follow up    Equipment Recommendations  3 in 1 bedside comode       Precautions / Restrictions Precautions Precautions: Fall Restrictions Weight Bearing Restrictions: No      Mobility Bed Mobility Overal bed mobility: Modified Independent             General bed mobility comments: Pt able to transfer supine to sit with HOB elevated and increased time. No physical assistance or cuing from PT.   Transfers Overall transfer level: Needs assistance Equipment used: 1 person hand held assist Transfers: Sit to/from Stand Sit to Stand: Min assist;Mod assist         General transfer comment: Pt needed min assist for sit to stand from the EOB and mod assist for sit to stand from lower toilet.    Balance Overall balance assessment: Needs assistance Sitting-balance support: Bilateral upper extremity supported Sitting balance-Leahy Scale: Good     Standing balance support: Bilateral upper extremity supported;During functional activity Standing balance-Leahy Scale: Poor Standing balance comment: Requires UE support for standing for balance.              High level balance activites: Direction  changes;Turns;Sudden stops High Level Balance Comments: Needs asssist with challenges.              ADL Overall ADL's : Needs assistance/impaired Eating/Feeding: Independent;Sitting   Grooming: Wash/dry hands;Min guard;Standing   Upper Body Bathing: Supervision/ safety;Sitting   Lower Body Bathing: Minimal assistance;Sit to/from stand   Upper Body Dressing : Set up;Sitting   Lower Body Dressing: Minimal assistance;Sit to/from stand   Toilet Transfer: Moderate assistance;Regular Toilet;Ambulation   Toileting- Clothing Manipulation and Hygiene: Moderate assistance;Sit to/from stand       Functional mobility during ADLs: Minimal assistance General ADL Comments: Pt will have 3:1 and RW at discharge.  Discussed need for tub bench at discharge as well with pt and daughter in order to increase safety with selfcare tasks.  Daughter states they were told that Hospice can get the bench for them once they are home if they feel thy need it.  Pt undecided about HHOT services at this time and stated to "let her think about it".          Perception Perception Perception Tested?: No   Praxis Praxis Praxis tested?: Within functional limits    Pertinent Vitals/Pain Pain Assessment: No/denies pain     Hand Dominance Right   Extremity/Trunk Assessment Upper Extremity Assessment Upper Extremity Assessment: Generalized weakness   Lower Extremity Assessment Lower Extremity Assessment: Defer to PT evaluation   Cervical / Trunk Assessment Cervical / Trunk Assessment: Kyphotic   Communication Communication Communication: No  difficulties   Cognition Arousal/Alertness: Awake/alert Behavior During Therapy: WFL for tasks assessed/performed Overall Cognitive Status: Within Functional Limits for tasks assessed                                Home Living Family/patient expects to be discharged to:: Private residence Living Arrangements: Children Available Help at  Discharge: Family;Available 24 hours/day Type of Home: House Home Access: Stairs to enter [employer] of Steps: 2 Entrance Stairs-Rails: None Home Layout: Two level;Able to live on main level with bedroom/bathroom Alternate Level Stairs-Number of Steps: flight Alternate Level Stairs-Rails: Right Bathroom Shower/Tub: Tub/shower unit;Curtain Shower/tub characteristics: Architectural technologist: Standard     Home Equipment: None   Additional Comments: Pt lives at daughter's home M-F and then daughter and pt go to pt's home F-Su. Pt's daughter works from home but stated pt will have someone with her almost all the time. Pt does not have to go up stairs in either household.       Prior Functioning/Environment Level of Independence: Independent                OT Problem List: Decreased strength;Decreased activity tolerance;Impaired balance (sitting and/or standing);Decreased knowledge of use of DME or AE;Decreased safety awareness                       End of Session Equipment Utilized During Treatment: Gait belt Nurse Communication: Mobility status  Activity Tolerance: Patient tolerated treatment well Patient left: in bed;with call bell/phone within reach;with nursing/sitter in room;with family/visitor present   Time: 9798-9211 OT Time Calculation (min): 29 min Charges:  OT General Charges $OT Visit: 1 Procedure OT Evaluation $Initial OT Evaluation Tier I: 1 Procedure OT Treatments $Self Care/Home Management : 23-37 mins  Daaron Dimarco OTR/L 11/08/2014, 3:25 PM

## 2014-11-08 NOTE — Discharge Instructions (Signed)
You were cared for by a hospitalist during your hospital stay. If you have any questions about your discharge medications or the care you received while you were in the hospital after you are discharged, you can call the unit and asked to speak with the hospitalist on call if the hospitalist that took care of you is not available. Once you are discharged, your primary care physician will handle any further medical issues. Please note that NO REFILLS for any discharge medications will be authorized once you are discharged, as it is imperative that you return to your primary care physician (or establish a relationship with a primary care physician if you do not have one) for your aftercare needs so that they can reassess your need for medications and monitor your lab values.     If you do not have a primary care physician, you can call [phone] for a physician referral.  Follow with Primary MD in 5-7 days   Get CBC, CMP checked by your doctor and again as further instructed.  Get a 2 view Chest X ray done next visit if you had Pneumonia of Lung problems at the [facility] reviewed and adjusted.  Please request your Prim.MD to go over all Hospital Tests and Procedure/Radiological results at the follow up, please get all Hospital records sent to your Prim MD by signing hospital release before you go home.  Activity: As tolerated with Full fall precautions use walker/cane & assistance as needed  Diet: regular  For Heart failure patients - Check your Weight same time everyday, if you gain over 2 pounds, or you develop in leg swelling, experience more shortness of breath or chest pain, call your Primary MD immediately. Follow Cardiac Low Salt Diet and 1.8 lit/day fluid restriction.  Disposition Home with hospice  If you experience worsening of your admission symptoms, develop shortness of breath, life threatening emergency, suicidal or homicidal thoughts you must seek medical attention  immediately by calling 911 or calling your MD immediately  if symptoms less severe.  You Must read complete instructions/literature along with all the possible adverse reactions/side effects for all the Medicines you take and that have been prescribed to you. Take any new Medicines after you have completely understood and accpet all the possible adverse reactions/side effects.   Do not drive and provide baby sitting services if your were admitted for syncope or siezures until you have seen by Primary MD or a Neurologist and advised to do so again.  Do not drive when taking Pain medications.   Do not take more than prescribed Pain, Sleep and Anxiety Medications  Special Instructions: If you have smoked or chewed Tobacco  in the last 2 yrs please stop smoking, stop any regular Alcohol  and or any Recreational drug use.  Wear Seat belts while driving.

## 2014-11-08 NOTE — Progress Notes (Signed)
Physical Therapy Treatment Patient Details Name: Debra [name] MRN: [id number] DOB: June 21, 1941 Today's Date: 11/08/2014    History of Present Illness Debra [name] is a 73 y.o. female presents with abdominal pain and pancreatic mass.  Now s/p endoscopy w/biopsy.    PT Comments    Pt admitted with above. Pt currently with functional limitations due to balance and endurance deficits.  Plan is home with Hospice care today.  Pt will benefit from skilled PT to increase their independence and safety with mobility to allow discharge to the venue listed below.    Follow Up Recommendations  Home health PT;Supervision/Assistance - 24 hour     Equipment Recommendations  Rolling walker with 5" wheels;3in1 (PT)    Recommendations for Other Services       Precautions / Restrictions Precautions Precautions: Fall Restrictions Weight Bearing Restrictions: No    Mobility  Bed Mobility Overal bed mobility: Modified Independent             General bed mobility comments: Pt able to transfer supine to sit with HOB elevated and increased time. No physical assistance or cuing from PT.   Transfers Overall transfer level: Needs assistance Equipment used: Rolling walker (2 wheeled) Transfers: Sit to/from Stand Sit to Stand: Min guard         General transfer comment: Pt able to scoot forward to get legs on floor prior to transfer and was able to sequence transition without cuing. Pt required min guard for safety and patient had Left/Anterior lean in standing.   Ambulation/Gait Ambulation/Gait assistance: Min guard;Min assist Ambulation Distance (Feet): 350 Feet Assistive device: Rolling walker (2 wheeled) Gait Pattern/deviations: Step-through pattern;Narrow base of support;Trunk flexed   Gait velocity interpretation: Below normal speed for age/gender General Gait Details: Pt initially safe with RW.  Toward last 100 feet of ambulation, pt needed assist to steer RW as well as cues for  sequencing steps and RW.  Pt appeared to fatigue so much that it was physically difficult to move RW.     Stairs            Wheelchair Mobility    Modified Rankin (Stroke Patients Only)       Balance Overall balance assessment: Needs assistance;History of Falls Sitting-balance support: No upper extremity supported;Feet supported Sitting balance-Leahy Scale: Good     Standing balance support: Bilateral upper extremity supported;During functional activity Standing balance-Leahy Scale: Poor Standing balance comment: Requires UE support for standing for balance.              High level balance activites: Direction changes;Turns;Sudden stops High Level Balance Comments: Needs asssist with challenges.      Cognition Arousal/Alertness: Awake/alert Behavior During Therapy: WFL for tasks assessed/performed Overall Cognitive Status: Within Functional Limits for tasks assessed                      Exercises      General Comments General comments (skin integrity, edema, etc.): Pt wanted to walk but refused exercises.        Pertinent Vitals/Pain Pain Assessment: No/denies pain  VSS    Home Living                      Prior Function            PT Goals (current goals can now be found in the care plan section) Progress towards PT goals: Progressing toward goals    Frequency  Min 3X/week  PT Plan Current plan remains appropriate    Co-evaluation             End of Session Equipment Utilized During Treatment: Gait belt Activity Tolerance: Patient limited by fatigue Patient left: in bed;with call bell/phone within reach     Time: 1142-1152 PT Time Calculation (min) (ACUTE ONLY): 10 min  Charges:  $Gait Training: 8-22 mins                    G CodesDenice Paradise 11-10-2014, 12:08 PM Virlee Stroschein,PT Acute Rehabilitation [phone] (pager)

## 2014-11-08 NOTE — Progress Notes (Addendum)
Follow-up spoke with patient and daughter [name] at bedside to discuss discharge needs - per discussion daughter plans to take pt home today by personal vehicle- requests 3n1 [facility] and walker with wheels- wrriter offered for this DME to be brought to the room for family to take home however dtr requests delivery to the home; [facility] contacted and will order with [facility] and representative to contact daughter [name] c: [phone] to arrange delivery time. Initially [facility] admission RN to see pt/family this evening however, pt stated " can't I have a day just to be at home" pt stated she was feeling "pretty good" and didn't see why it'it had to be today'- [name] contacted her sister Lattie Haw and all are requesting [facility] admission RN to see pt Friday morning.  [facility] contacted; assessment visit time confirmed with family for Friday morning 10 am.  Daughter is aware staff RN will review discharge instructions and she should receive prescriptions and the completed GOLD DNR form with paperwork to take home. CMRN Neoma Laming notified of above; Probation officer will also make attending Dr Cruzita Lederer aware. Danton Sewer RN MSN [facility] Liaison [phone]

## 2014-11-08 NOTE — Discharge Summary (Addendum)
Physician Discharge Summary  Debra [name] OFB:[id number] DOB: May 31, 1941 DOA: 11/05/2014  PCP: Rodena Medin, MD  Admit date: 11/05/2014 Discharge date: 11/08/2014  Time spent: 35 minutes  Recommendations for Outpatient Follow-up:  1. Follow up with hospice services 2. Follow up with Dr. Learta Codding next week   Discharge Diagnoses:  Principal Problem:   Fall Active Problems:   HTN (hypertension)   Leukocytosis   Anemia of chronic disease   Protein-calorie malnutrition, severe   Uncontrolled diabetes mellitus with complications   Cancer of pancreas, tail   Pancreatic carcinoma metastatic to liver   DNR (do not resuscitate)  Discharge Condition: guarded, with hospice  Diet recommendation: as tolerated, regular  Filed Weights   11/05/14 2259 11/06/14 0519  Weight: 44.997 kg (99 lb 3.2 oz) 44.815 kg (98 lb 12.8 oz)   History of present illness:  Debra [name] is a 73 y.o. female with PMH of pancreatic cancer, diabetes mellitus, hypertension, anemia of chronic disease, who presents with a fall. Patient was recently diagnosed with pancreatic cancer. Currently she is followed up by Dr. Benay Spice. She is getting chemotherapy and received chemotherapy 3 days ago (first dose). she has nausea and occasional vomiting. She has very poor oral intake and generalized weakness. In the early morning, at about 6:00 AM, she had a fall after using bathroom and tried to stand up and get off the toilet. She injured her forehead. She did not lose consciousness. She did not have palpitation, seizures, dizziness, chest pain or shortness of breath before the event. Patient does not have weakness, numbness or tingling sensations in her extremities. She denies fever, chills, cough, chest pain, SOB, diarrhea,dysuria, urgency, frequency, hematuria, skin rashes, or leg swelling. No hematuria or hematochezia. She was found to have Hgb drop from 8.3 on 11/02/14 to 7.1. CT-head is negative for acute abnormalities.  Leukocytosis with WBC 14.8. Normal temperature. Tachycardia with heart rate between 120-140. Patient is admitted to inpatient for further evaluation treatment.  Hospital Course:   Fall: it is most likely caused by combination of possible orthostatic status and generalized deconditioning. Patient has nausea and vomiting which are likely secondary to chemotherapy, making dehydration and orthostatic status very likely. In addition, patient has worsening anemia, which may have also contributed. Patient did not have any alarming symptoms before the event happened, such as palpitation, chest pain, seizure and unilateral weakness or numbness. With IV hydration and blood transfusions she has clinically improved, and was able to work well with the physical therapist. She continues to be somewhat weak, however she wishes to go home and will have PT at home via the hospice services Goals of care - I had a long discussion with the patient and her daughter regarding her overall condition. She has a terminal illness and she is not a candidate for further chemotherapy per Dr. Benay Spice. We have addressed her code status and concept of hospice care. Per patient's and family wishes, will set up home hospice services will follow her at home.  Sinus tachycardia - multifactorial cannot exclude underlying infection, possible contributing also tumor burden, dehydration, anemia. This has been chronic and she is asymptomatic from this. Leukocytosis: Etiology is not clear, no apparent infectious source, resolved. She was initially started on broad-spectrum antibiotics, and were narrowed to levofloxacin which she will continue for 3 additional days. Anemia of chronic disease: Hgb dropped from 8.3 to 7.1. Due to large tumor burden, ?luminal invasion,  Patient was transfused 2 units of packed red blood cells and her hemoglobin  has remained stable. DM-II: Last A1c was 8.4 on 09/26/14. On Levemir 6 units at home daily Severe  protein-capillary mulnutrition: Secondary to pancreatic cancer  Stage IV metastatic Pancreatic cancer: Followed up by Dr. Benay Spice  Procedures:  None    Consultations:  Oncology   Discharge Exam: Filed Vitals:   11/07/14 1931 11/07/14 2228 11/08/14 0604 11/08/14 1333  BP: 167/90 160/88 160/87 161/89  Pulse:    124  Temp:  98.7 F (37.1 C) 98.2 F (36.8 C) 99.3 F (37.4 C)  TempSrc:  Oral Oral Oral  Resp:  16 16 24   Height:      Weight:      SpO2:  100% 100% 100%   General: NAD Cardiovascular: RRR Respiratory: CTA biL  Discharge Instructions    Medication List    TAKE these medications        acetaminophen 325 MG tablet  Commonly known as:  TYLENOL  Take 2 tablets (650 mg total) by mouth every 6 (six) hours as needed for mild pain.     Hydrocodone-Acetaminophen 5-300 MG Tabs  Commonly known as:  VICODIN  Take 1 tablet by mouth every 6 (six) hours as needed.     ibuprofen 200 MG tablet  Commonly known as:  ADVIL,MOTRIN  Take 200 mg by mouth every 6 (six) hours as needed for mild pain.     insulin detemir 100 UNIT/ML injection  Commonly known as:  LEVEMIR  Inject 6 Units into the skin at bedtime.     levofloxacin 500 MG tablet  Commonly known as:  LEVAQUIN  Take 1 tablet (500 mg total) by mouth daily.     lidocaine-prilocaine cream  Commonly known as:  EMLA  Apply 1 application topically as needed. Apply to [facility] site 1-2 hours prior to stick and cover with plastic wrap     lisinopril 10 MG tablet  Commonly known as:  PRINIVIL,ZESTRIL  Take 1 tablet (10 mg total) by mouth daily.     metoCLOPramide 5 MG tablet  Commonly known as:  REGLAN  Take 1 tablet (5 mg total) by mouth 3 (three) times daily before meals.     mirtazapine 15 MG tablet  Commonly known as:  REMERON  Take 1 tablet (15 mg total) by mouth at bedtime.     multivitamin with minerals Tabs tablet  Take 1 tablet by mouth daily.     ondansetron 4 MG tablet  Commonly known as:  ZOFRAN    Take 1 tablet (4 mg total) by mouth every 6 (six) hours as needed for nausea.     sorbitol 70 % solution  Take 30 mLs by mouth 2 (two) times daily.     traMADol 50 MG tablet  Commonly known as:  ULTRAM  Take 1 tablet (50 mg total) by mouth every 6 (six) hours as needed for moderate pain.           Follow-up Information    Follow up with Hospice at [facility].   Specialty:  Hospice and Palliative Medicine   Why:  home hospice 12/3   Contact information:   [place] [id number] [phone]       Follow up with [name], MD. Schedule an appointment as soon as possible for a visit on 11/16/2014.   Specialty:  Oncology   Why:  Come to scheduled appointment   Contact information:   [place] [address] [phone]       The results of significant  diagnostics from this hospitalization (including imaging, microbiology, ancillary and laboratory) are listed below for reference.    Significant Diagnostic Studies: Dg Chest 2 View  11/05/2014   CLINICAL DATA:  Fall with dizziness and left-sided pain. Initial encounter. Metastatic pancreatic cancer.  EXAM: CHEST  2 VIEW  COMPARISON:  12/08/2013 chest radiographs  FINDINGS: The cardiomediastinal silhouette is unremarkable.  A right central venous catheter is identified with tip overlying the superior cavoatrial junction.  Calcified granulomas in the right lower lung again noted.  There is no evidence of focal airspace disease, pulmonary edema, suspicious pulmonary nodule/mass, pleural effusion, or pneumothorax. No acute bony abnormalities are identified.  IMPRESSION: No active cardiopulmonary disease.   Electronically Signed   By: Hassan Rowan M.D.   On: 11/05/2014 19:01   Ct Head Wo Contrast  11/05/2014   CLINICAL DATA:  Fall this morning, hit head.  EXAM: CT HEAD WITHOUT CONTRAST  TECHNIQUE: Contiguous axial images were obtained from the base of the skull through the vertex without  intravenous contrast.  COMPARISON:  None.  FINDINGS: No evidence of an acute infarct, acute hemorrhage, mass lesion, mass effect or hydrocephalus. Mild atrophy. Mild periventricular low attenuation. Visualized portions of the paranasal sinuses and mastoid air cells are clear. No fracture.  IMPRESSION: 1. No acute intracranial abnormality. 2. Mild atrophy and mild chronic microvascular white matter ischemic changes.   Electronically Signed   By: Lorin Picket M.D.   On: 11/05/2014 18:53   Microbiology: Recent Results (from the past 240 hour(s))  Culture, blood (routine x 2)     Status: None (Preliminary result)   Collection Time: 11/05/14 11:50 PM  Result Value Ref Range Status   Specimen Description BLOOD LEFT ANTECUBITAL  Final   Special Requests BOTTLES DRAWN AEROBIC ONLY 5CC  Final   Culture  Setup Time   Final    11/06/2014 08:50 Performed at [facility]    Culture   Final           BLOOD CULTURE RECEIVED NO GROWTH TO DATE CULTURE WILL BE HELD FOR 5 DAYS BEFORE ISSUING A FINAL NEGATIVE REPORT Performed at [facility]    Report Status PENDING  Incomplete  Culture, blood (routine x 2)     Status: None (Preliminary result)   Collection Time: 11/06/14 12:00 AM  Result Value Ref Range Status   Specimen Description BLOOD LEFT ANTECUBITAL  Final   Special Requests BOTTLES DRAWN AEROBIC AND ANAEROBIC 5CC EA  Final   Culture  Setup Time   Final    11/06/2014 08:50 Performed at [facility]    Culture   Final           BLOOD CULTURE RECEIVED NO GROWTH TO DATE CULTURE WILL BE HELD FOR 5 DAYS BEFORE ISSUING A FINAL NEGATIVE REPORT Performed at [facility]    Report Status PENDING  Incomplete  Urine culture     Status: None   Collection Time: 11/06/14  1:05 PM  Result Value Ref Range Status   Specimen Description URINE, CLEAN CATCH  Final   Special Requests NONE  Final   Culture  Setup Time   Final    11/06/2014 17:11 Performed at [facility]   Final    50,000 COLONIES/ML Performed at [facility]    Culture   Final    Multiple bacterial morphotypes present, none predominant. Suggest appropriate recollection if clinically indicated. Performed at [facility]  Report Status 11/07/2014 FINAL  Final     Labs: Basic Metabolic Panel:  Recent Labs Lab 11/02/14 1153 11/05/14 1727 11/06/14 0617 11/07/14 0431  NA 134* 133* 135* 136*  K 4.1 4.5 4.2 3.7  CL  --  95* 98 102  CO2 25 25 20 22   GLUCOSE 372* 165* 175* 144*  BUN 19.4 20 18 14   CREATININE 0.8 0.47* 0.46* 0.43*  CALCIUM 10.0 10.0 8.9 8.9   Liver Function Tests:  Recent Labs Lab 11/02/14 1153 11/05/14 1727 11/07/14 0431  AST 17 22 19   ALT 15 20 22   ALKPHOS 289* 234* 248*  BILITOT 0.32 0.5 0.6  PROT 7.3 7.0 6.2  ALBUMIN 2.2* 1.9* 1.7*   No results for input(s): LIPASE, AMYLASE in the last 168 hours. No results for input(s): AMMONIA in the last 168 hours. CBC:  Recent Labs Lab 11/02/14 1153 11/05/14 1727 11/05/14 2350 11/06/14 0617 11/07/14 0431  WBC 16.2* 14.8* 14.0* 12.0* 6.7  NEUTROABS 13.5* 13.6*  --   --  5.3  HGB 8.3* 7.1* 7.2* 6.7* 9.7*  HCT 25.5* 21.8* 22.1* 19.9* 28.4*  MCV 82.8 81.3 83.7 83.6 80.9  PLT 473* 405* 373 374 299   CBG:  Recent Labs Lab 11/07/14 1225 11/07/14 1658 11/07/14 2226 11/08/14 0830 11/08/14 1232  GLUCAP 222* 225* 179* 105* 164*    Signed:  GHERGHE, COSTIN  [facility] 11/08/2014, 4:34 PM

## 2014-11-12 LAB — CULTURE, BLOOD (ROUTINE X 2)
CULTURE: NO GROWTH
Culture: NO GROWTH

## 2014-11-13 ENCOUNTER — Telehealth: Payer: Self-pay | Admitting: Oncology

## 2014-11-13 NOTE — Telephone Encounter (Signed)
Pt's daughter [name] called lft msg to cancel all visits for 12/11 will call [name] back to r/s these apts...Marland KitchenMarland KitchenKJ

## 2014-11-16 ENCOUNTER — Other Ambulatory Visit: Payer: Medicare HMO

## 2014-11-16 ENCOUNTER — Ambulatory Visit: Payer: Medicare HMO | Admitting: Nurse Practitioner

## 2014-11-16 ENCOUNTER — Ambulatory Visit: Payer: Medicare HMO

## 2014-11-26 ENCOUNTER — Telehealth: Payer: Self-pay | Admitting: Oncology

## 2014-11-26 NOTE — Telephone Encounter (Signed)
Pt's daughter [name] called to cancel all apts states pt is with Hospice..... KJ

## 2014-11-28 ENCOUNTER — Ambulatory Visit: Payer: Medicare HMO | Admitting: Oncology

## 2014-11-28 ENCOUNTER — Other Ambulatory Visit: Payer: Medicare HMO

## 2014-11-28 ENCOUNTER — Ambulatory Visit: Payer: Medicare HMO

## 2014-11-28 ENCOUNTER — Encounter: Payer: Medicare HMO | Admitting: Nutrition

## 2014-12-03 ENCOUNTER — Other Ambulatory Visit: Payer: Self-pay | Admitting: Internal Medicine

## 2014-12-15 ENCOUNTER — Encounter (HOSPITAL_COMMUNITY): Payer: Self-pay | Admitting: Emergency Medicine

## 2014-12-15 ENCOUNTER — Emergency Department (HOSPITAL_COMMUNITY)

## 2014-12-15 ENCOUNTER — Inpatient Hospital Stay (HOSPITAL_COMMUNITY)
Admission: EM | Admit: 2014-12-15 | Discharge: 2014-12-20 | DRG: 871 | Disposition: A | Discharge status: Hospice | Attending: Internal Medicine | Admitting: Internal Medicine

## 2014-12-15 DIAGNOSIS — R778 Other specified abnormalities of plasma proteins: Secondary | ICD-10-CM | POA: Insufficient documentation

## 2014-12-15 DIAGNOSIS — D490 Neoplasm of unspecified behavior of digestive system: Secondary | ICD-10-CM | POA: Diagnosis present

## 2014-12-15 DIAGNOSIS — IMO0001 Reserved for inherently not codable concepts without codable children: Secondary | ICD-10-CM | POA: Insufficient documentation

## 2014-12-15 DIAGNOSIS — R627 Adult failure to thrive: Secondary | ICD-10-CM | POA: Diagnosis present

## 2014-12-15 DIAGNOSIS — R1010 Upper abdominal pain, unspecified: Secondary | ICD-10-CM

## 2014-12-15 DIAGNOSIS — D638 Anemia in other chronic diseases classified elsewhere: Secondary | ICD-10-CM | POA: Diagnosis present

## 2014-12-15 DIAGNOSIS — R000 Tachycardia, unspecified: Secondary | ICD-10-CM

## 2014-12-15 DIAGNOSIS — Z79899 Other long term (current) drug therapy: Secondary | ICD-10-CM

## 2014-12-15 DIAGNOSIS — E872 Acidosis, unspecified: Secondary | ICD-10-CM

## 2014-12-15 DIAGNOSIS — C259 Malignant neoplasm of pancreas, unspecified: Secondary | ICD-10-CM | POA: Diagnosis present

## 2014-12-15 DIAGNOSIS — Z515 Encounter for palliative care: Secondary | ICD-10-CM

## 2014-12-15 DIAGNOSIS — Z66 Do not resuscitate: Secondary | ICD-10-CM | POA: Diagnosis present

## 2014-12-15 DIAGNOSIS — IMO0002 Reserved for concepts with insufficient information to code with codable children: Secondary | ICD-10-CM | POA: Diagnosis present

## 2014-12-15 DIAGNOSIS — E860 Dehydration: Secondary | ICD-10-CM | POA: Diagnosis present

## 2014-12-15 DIAGNOSIS — R531 Weakness: Secondary | ICD-10-CM

## 2014-12-15 DIAGNOSIS — K922 Gastrointestinal hemorrhage, unspecified: Secondary | ICD-10-CM | POA: Diagnosis present

## 2014-12-15 DIAGNOSIS — C787 Secondary malignant neoplasm of liver and intrahepatic bile duct: Secondary | ICD-10-CM | POA: Diagnosis present

## 2014-12-15 DIAGNOSIS — D649 Anemia, unspecified: Secondary | ICD-10-CM | POA: Diagnosis present

## 2014-12-15 DIAGNOSIS — Z880 Allergy status to penicillin: Secondary | ICD-10-CM

## 2014-12-15 DIAGNOSIS — E1165 Type 2 diabetes mellitus with hyperglycemia: Secondary | ICD-10-CM | POA: Diagnosis present

## 2014-12-15 DIAGNOSIS — A419 Sepsis, unspecified organism: Secondary | ICD-10-CM | POA: Diagnosis not present

## 2014-12-15 DIAGNOSIS — E118 Type 2 diabetes mellitus with unspecified complications: Secondary | ICD-10-CM

## 2014-12-15 DIAGNOSIS — Z681 Body mass index (BMI) 19 or less, adult: Secondary | ICD-10-CM

## 2014-12-15 DIAGNOSIS — D72829 Elevated white blood cell count, unspecified: Secondary | ICD-10-CM | POA: Diagnosis present

## 2014-12-15 DIAGNOSIS — E43 Unspecified severe protein-calorie malnutrition: Secondary | ICD-10-CM | POA: Diagnosis present

## 2014-12-15 DIAGNOSIS — R64 Cachexia: Secondary | ICD-10-CM | POA: Diagnosis present

## 2014-12-15 DIAGNOSIS — Z794 Long term (current) use of insulin: Secondary | ICD-10-CM

## 2014-12-15 DIAGNOSIS — R7989 Other specified abnormal findings of blood chemistry: Secondary | ICD-10-CM

## 2014-12-15 DIAGNOSIS — R6510 Systemic inflammatory response syndrome (SIRS) of non-infectious origin without acute organ dysfunction: Secondary | ICD-10-CM | POA: Insufficient documentation

## 2014-12-15 DIAGNOSIS — R109 Unspecified abdominal pain: Secondary | ICD-10-CM

## 2014-12-15 DIAGNOSIS — Z791 Long term (current) use of non-steroidal anti-inflammatories (NSAID): Secondary | ICD-10-CM

## 2014-12-15 DIAGNOSIS — I10 Essential (primary) hypertension: Secondary | ICD-10-CM | POA: Diagnosis present

## 2014-12-15 LAB — URINALYSIS, ROUTINE W REFLEX MICROSCOPIC
Glucose, UA: NEGATIVE mg/dL
Hgb urine dipstick: NEGATIVE
Ketones, ur: 15 mg/dL — AB
LEUKOCYTES UA: NEGATIVE
Nitrite: NEGATIVE
PH: 5 (ref 5.0–8.0)
Protein, ur: 30 mg/dL — AB
Specific Gravity, Urine: 1.027 (ref 1.005–1.030)
Urobilinogen, UA: 1 mg/dL (ref 0.0–1.0)

## 2014-12-15 LAB — COMPREHENSIVE METABOLIC PANEL
ALBUMIN: 1.8 g/dL — AB (ref 3.5–5.2)
ALK PHOS: 233 U/L — AB (ref 39–117)
ALT: 10 U/L (ref 0–35)
AST: 20 U/L (ref 0–37)
Anion gap: 10 (ref 5–15)
BUN: 22 mg/dL (ref 6–23)
CO2: 25 mmol/L (ref 19–32)
CREATININE: 0.54 mg/dL (ref 0.50–1.10)
Calcium: 9.9 mg/dL (ref 8.4–10.5)
Chloride: 99 mEq/L (ref 96–112)
GFR calc non Af Amer: >90 mL/min (ref >90)
Glucose, Bld: 144 mg/dL — ABNORMAL HIGH (ref 70–99)
Potassium: 3.7 mmol/L (ref 3.5–5.1)
SODIUM: 134 mmol/L — AB (ref 135–145)
Total Bilirubin: 0.5 mg/dL (ref 0.3–1.2)
Total Protein: 6.6 g/dL (ref 6.0–8.3)

## 2014-12-15 LAB — CBC WITH DIFFERENTIAL/PLATELET
BASOS ABS: 0 10*3/uL (ref 0.0–0.1)
BASOS PCT: 0 % (ref 0–1)
Eosinophils Absolute: 0.3 10*3/uL (ref 0.0–0.7)
Eosinophils Relative: 1 % (ref 0–5)
HEMATOCRIT: 28.3 % — AB (ref 36.0–46.0)
HEMOGLOBIN: 9.2 g/dL — AB (ref 12.0–15.0)
Lymphocytes Relative: 8 % — ABNORMAL LOW (ref 12–46)
Lymphs Abs: 1.5 10*3/uL (ref 0.7–4.0)
MCH: 28.1 pg (ref 26.0–34.0)
MCHC: 32.5 g/dL (ref 30.0–36.0)
MCV: 86.5 fL (ref 78.0–100.0)
MONOS PCT: 7 % (ref 3–12)
Monocytes Absolute: 1.3 10*3/uL — ABNORMAL HIGH (ref 0.1–1.0)
Neutro Abs: 16.4 10*3/uL — ABNORMAL HIGH (ref 1.7–7.7)
Neutrophils Relative %: 84 % — ABNORMAL HIGH (ref 43–77)
Platelets: 373 10*3/uL (ref 150–400)
RBC: 3.27 MIL/uL — ABNORMAL LOW (ref 3.87–5.11)
RDW: 16.5 % — AB (ref 11.5–15.5)
WBC: 19.5 10*3/uL — AB (ref 4.0–10.5)

## 2014-12-15 LAB — URINE MICROSCOPIC-ADD ON

## 2014-12-15 LAB — GLUCOSE, CAPILLARY: Glucose-Capillary: 104 mg/dL — ABNORMAL HIGH (ref 70–99)

## 2014-12-15 LAB — I-STAT CG4 LACTIC ACID, ED
LACTIC ACID, VENOUS: 4.06 mmol/L — AB (ref 0.5–2.2)
Lactic Acid, Venous: 3.72 mmol/L — ABNORMAL HIGH (ref 0.5–2.2)

## 2014-12-15 LAB — TROPONIN I: Troponin I: 0.23 ng/mL — ABNORMAL HIGH (ref <0.031)

## 2014-12-15 MED ORDER — SODIUM CHLORIDE 0.9 % IV SOLN
INTRAVENOUS | Status: DC
  Administered 2014-12-15: 18:00:00 via INTRAVENOUS

## 2014-12-15 MED ORDER — VANCOMYCIN HCL IN DEXTROSE 1-5 GM/200ML-% IV SOLN
1000.0000 mg | INTRAVENOUS | Status: DC
  Administered 2014-12-16 – 2014-12-18 (×3): 1000 mg via INTRAVENOUS
  Filled 2014-12-15 (×3): qty 200

## 2014-12-15 MED ORDER — SODIUM CHLORIDE 0.9 % IV SOLN
INTRAVENOUS | Status: DC
  Administered 2014-12-16 – 2014-12-18 (×5): via INTRAVENOUS

## 2014-12-15 MED ORDER — ASPIRIN EC 81 MG PO TBEC
81.0000 mg | DELAYED_RELEASE_TABLET | Freq: Every day | ORAL | Status: DC
  Administered 2014-12-16 – 2014-12-19 (×4): 81 mg via ORAL
  Filled 2014-12-15 (×6): qty 1

## 2014-12-15 MED ORDER — SODIUM CHLORIDE 0.9 % IV BOLUS (SEPSIS)
1000.0000 mL | Freq: Once | INTRAVENOUS | Status: AC
  Administered 2014-12-15: 1000 mL via INTRAVENOUS

## 2014-12-15 MED ORDER — ACETAMINOPHEN 650 MG RE SUPP: 650.0000 mg | Freq: Four times a day (QID) | RECTAL | Status: DC | PRN

## 2014-12-15 MED ORDER — MORPHINE SULFATE 2 MG/ML IJ SOLN
1.0000 mg | INTRAMUSCULAR | Status: DC | PRN
  Filled 2014-12-15: qty 1

## 2014-12-15 MED ORDER — ACETAMINOPHEN 325 MG PO TABS
650.0000 mg | ORAL_TABLET | Freq: Four times a day (QID) | ORAL | Status: DC | PRN
  Administered 2014-12-16 – 2014-12-19 (×7): 650 mg via ORAL
  Filled 2014-12-15 (×8): qty 2

## 2014-12-15 MED ORDER — SODIUM CHLORIDE 0.9 % IV SOLN: Freq: Once | INTRAVENOUS | Status: AC

## 2014-12-15 MED ORDER — SODIUM CHLORIDE 0.9 % IJ SOLN
10.0000 mL | INTRAMUSCULAR | Status: DC | PRN
  Administered 2014-12-18 – 2014-12-19 (×3): 10 mL
  Filled 2014-12-15 (×3): qty 40

## 2014-12-15 MED ORDER — LEVOFLOXACIN IN D5W 750 MG/150ML IV SOLN: 750.0000 mg | INTRAVENOUS | Status: DC

## 2014-12-15 MED ORDER — ENSURE COMPLETE PO LIQD
237.0000 mL | Freq: Three times a day (TID) | ORAL | Status: DC
  Administered 2014-12-16 – 2014-12-19 (×7): 237 mL via ORAL

## 2014-12-15 MED ORDER — SODIUM CHLORIDE 0.9 % IJ SOLN
INTRAMUSCULAR | Status: AC
Start: 2014-12-15 — End: 2014-12-15
  Filled 2014-12-15: qty 3

## 2014-12-15 MED ORDER — DEXTROSE 5 % IV SOLN
1.0000 g | Freq: Three times a day (TID) | INTRAVENOUS | Status: DC
  Administered 2014-12-15 – 2014-12-18 (×9): 1 g via INTRAVENOUS
  Filled 2014-12-15 (×11): qty 1

## 2014-12-15 MED ORDER — SODIUM CHLORIDE 0.9 % IV BOLUS (SEPSIS)
500.0000 mL | INTRAVENOUS | Status: DC
  Administered 2014-12-15: 500 mL via INTRAVENOUS

## 2014-12-15 MED ORDER — VANCOMYCIN HCL IN DEXTROSE 1-5 GM/200ML-% IV SOLN
1000.0000 mg | Freq: Once | INTRAVENOUS | Status: AC
  Administered 2014-12-15: 1000 mg via INTRAVENOUS
  Filled 2014-12-15 (×2): qty 200

## 2014-12-15 MED ORDER — CHLORHEXIDINE GLUCONATE 0.12 % MT SOLN
15.0000 mL | Freq: Two times a day (BID) | OROMUCOSAL | Status: DC
  Administered 2014-12-16 – 2014-12-19 (×7): 15 mL via OROMUCOSAL
  Filled 2014-12-15 (×11): qty 15

## 2014-12-15 MED ORDER — CETYLPYRIDINIUM CHLORIDE 0.05 % MT LIQD
7.0000 mL | Freq: Two times a day (BID) | OROMUCOSAL | Status: DC
  Administered 2014-12-16 – 2014-12-20 (×7): 7 mL via OROMUCOSAL

## 2014-12-15 MED ORDER — ONDANSETRON HCL 4 MG/2ML IJ SOLN: 4.0000 mg | Freq: Four times a day (QID) | INTRAMUSCULAR | Status: DC | PRN

## 2014-12-15 MED ORDER — INSULIN ASPART 100 UNIT/ML ~~LOC~~ SOLN: 0.0000 [IU] | SUBCUTANEOUS | Status: DC

## 2014-12-15 MED ORDER — SODIUM CHLORIDE 0.9 % IJ SOLN
10.0000 mL | Freq: Two times a day (BID) | INTRAMUSCULAR | Status: DC
  Administered 2014-12-20: 10 mL

## 2014-12-15 MED ORDER — ENOXAPARIN SODIUM 40 MG/0.4ML ~~LOC~~ SOLN
40.0000 mg | SUBCUTANEOUS | Status: DC
  Administered 2014-12-15 – 2014-12-18 (×4): 40 mg via SUBCUTANEOUS
  Filled 2014-12-15 (×5): qty 0.4

## 2014-12-15 MED ORDER — LEVOFLOXACIN IN D5W 750 MG/150ML IV SOLN
750.0000 mg | Freq: Once | INTRAVENOUS | Status: AC
  Administered 2014-12-15: 750 mg via INTRAVENOUS
  Filled 2014-12-15: qty 150

## 2014-12-15 MED ORDER — DEXTROSE 5 % IV SOLN
2.0000 g | Freq: Once | INTRAVENOUS | Status: AC
  Administered 2014-12-15: 2 g via INTRAVENOUS
  Filled 2014-12-15: qty 2

## 2014-12-15 NOTE — ED Provider Notes (Signed)
CSN: [id number]     Arrival date & time 12/15/14  1257 History   First MD Initiated Contact with Patient 12/15/14 1310     Chief Complaint  Patient presents with  . Weakness     (Consider location/radiation/quality/duration/timing/severity/associated sxs/prior Treatment) HPI Comments: Patient home with 3 day history of generalized weakness, fatigue and decreased appetite. Patient has pancreatic cancer and is currently in hospice not receiving any treatment. She denies any recent falls but has not been able to her with her walker which she usually is. She denies any headache, neck pain, chest pain, abdominal pain. She is tachycardic on arrival she endorses poor by mouth intake. She denies any constipation or diarrhea. She denies any dizziness, nausea, vomiting, weakness numbness or tingling.  The history is provided by the patient and the EMS personnel. The history is limited by the condition of the patient.    Past Medical History  Diagnosis Date  . Type II diabetes mellitus     "just dx'd today' (12/08/2013)  . Hypertension   . Acute renal failure   . DKA (diabetic ketoacidoses)   . Pancreatic cancer 2015   Past Surgical History  Procedure Laterality Date  . Tubal ligation    . Dental surgery    . Eus N/A 09/28/2014    Procedure: UPPER ENDOSCOPIC ULTRASOUND (EUS) LINEAR;  Surgeon: Beryle Beams, MD;  Location: [facility];  Service: Endoscopy;  Laterality: N/A;   History reviewed. No pertinent family history. History  Substance Use Topics  . Smoking status: Never Smoker   . Smokeless tobacco: Never Used  . Alcohol Use: No   OB History    No data available     Review of Systems  Constitutional: Positive for fever, activity change, appetite change and fatigue.  Eyes: Negative for visual disturbance.  Respiratory: Negative for cough, chest tightness and shortness of breath.   Cardiovascular: Negative for chest pain.  Gastrointestinal: Positive for nausea. Negative for  vomiting and abdominal pain.  Genitourinary: Negative for dysuria, hematuria, vaginal bleeding and vaginal discharge.  Musculoskeletal: Positive for myalgias and arthralgias. Negative for back pain and neck pain.  Skin: Negative for rash.  Neurological: Positive for weakness and light-headedness. Negative for dizziness.  A complete 10 system review of systems was obtained and all systems are negative except as noted in the HPI and PMH.      Allergies  Amoxicillin  Home Medications   Prior to Admission medications   Medication Sig Start Date End Date Taking? Authorizing Provider  acetaminophen (TYLENOL) 325 MG tablet Take 2 tablets (650 mg total) by mouth every 6 (six) hours as needed for mild pain. 09/29/14  Yes Venetia Maxon Rama, MD  ibuprofen (ADVIL,MOTRIN) 200 MG tablet Take 200 mg by mouth every 6 (six) hours as needed for mild pain.   Yes Historical Provider, MD  insulin detemir (LEVEMIR) 100 UNIT/ML injection Inject 6 Units into the skin at bedtime.   Yes Historical Provider, MD  lidocaine-prilocaine (EMLA) cream Apply 1 application topically as needed. Apply to [facility] site 1-2 hours prior to stick and cover with plastic wrap 10/11/14  Yes Ladell Pier, MD  lisinopril (PRINIVIL,ZESTRIL) 10 MG tablet Take 1 tablet (10 mg total) by mouth daily. 12/12/13  Yes Eugenie Filler, MD  mirtazapine (REMERON) 15 MG tablet Take 1 tablet (15 mg total) by mouth at bedtime. 11/08/14  Yes Costin Karlyne Greenspan, MD  Multiple Vitamin (MULTIVITAMIN WITH MINERALS) TABS tablet Take 1 tablet by mouth daily. 09/29/14  Yes Venetia Maxon Rama, MD  sorbitol 70 % solution Take 30 mLs by mouth 2 (two) times daily. 10/23/14  Yes Owens Shark, NP  Hydrocodone-Acetaminophen (VICODIN) 5-300 MG TABS Take 1 tablet by mouth every 6 (six) hours as needed. Patient not taking: Reported on 12/15/2014 11/08/14   Caren Griffins, MD  levofloxacin (LEVAQUIN) 500 MG tablet Take 1 tablet (500 mg total) by mouth daily. Patient not  taking: Reported on 12/15/2014 11/08/14   Caren Griffins, MD  metoCLOPramide (REGLAN) 5 MG tablet Take 1 tablet (5 mg total) by mouth 3 (three) times daily before meals. Patient not taking: Reported on 12/15/2014 10/11/14   Ladell Pier, MD  ondansetron (ZOFRAN) 4 MG tablet Take 1 tablet (4 mg total) by mouth every 6 (six) hours as needed for nausea. Patient not taking: Reported on 12/15/2014 09/29/14   Venetia Maxon Rama, MD  traMADol (ULTRAM) 50 MG tablet Take 1 tablet (50 mg total) by mouth every 6 (six) hours as needed for moderate pain. Patient not taking: Reported on 12/15/2014 10/11/14   Ladell Pier, MD   BP 156/90 mmHg  Pulse 115  Temp(Src) 98.7 F (37.1 C) (Oral)  Resp 20  Ht 5\' 1"  (1.549 m)  Wt 96 lb 15.8 oz (43.993 kg)  BMI 18.33 kg/m2  SpO2 100% Physical Exam  Constitutional: She is oriented to person, place, and time. She appears well-developed and well-nourished. No distress.  Dry mucus membranes.    HENT:  Head: Normocephalic and atraumatic.  Mouth/Throat: Oropharynx is clear and moist. No oropharyngeal exudate.  Eyes: Conjunctivae and EOM are normal. Pupils are equal, round, and reactive to light.  Neck: Normal range of motion. Neck supple.  No meningismus.  Cardiovascular: Normal rate, normal heart sounds and intact distal pulses.   No murmur heard. Tachycardia Port R upper chest  Pulmonary/Chest: Effort normal and breath sounds normal. No respiratory distress. She exhibits no tenderness.  Abdominal: Soft. There is no tenderness. There is no rebound and no guarding.  Musculoskeletal: Normal range of motion. She exhibits no edema or tenderness.  Neurological: She is alert and oriented to person, place, and time. No cranial nerve deficit. She exhibits normal muscle tone. Coordination normal.  No ataxia on finger to nose bilaterally. No pronator drift. 5/5 strength throughout. CN 2-12 intact.  Equal grip strength. Sensation intact.  Skin: Skin is warm.  Psychiatric: She  has a normal mood and affect. Her behavior is normal.  Nursing note and vitals reviewed.   ED Course  Procedures (including critical care time) Labs Review Labs Reviewed  CBC WITH DIFFERENTIAL - Abnormal; Notable for the following:    WBC 19.5 (*)    RBC 3.27 (*)    Hemoglobin 9.2 (*)    HCT 28.3 (*)    RDW 16.5 (*)    Neutrophils Relative % 84 (*)    Neutro Abs 16.4 (*)    Lymphocytes Relative 8 (*)    Monocytes Absolute 1.3 (*)    All other components within normal limits  COMPREHENSIVE METABOLIC PANEL - Abnormal; Notable for the following:    Sodium 134 (*)    Glucose, Bld 144 (*)    Albumin 1.8 (*)    Alkaline Phosphatase 233 (*)    All other components within normal limits  URINALYSIS, ROUTINE W REFLEX MICROSCOPIC - Abnormal; Notable for the following:    Color, Urine AMBER (*)    APPearance CLOUDY (*)    Bilirubin Urine SMALL (*)  Ketones, ur 15 (*)    Protein, ur 30 (*)    All other components within normal limits  TROPONIN I - Abnormal; Notable for the following:    Troponin I 0.23 (*)    All other components within normal limits  URINE MICROSCOPIC-ADD ON - Abnormal; Notable for the following:    Bacteria, UA FEW (*)    Casts HYALINE CASTS (*)    All other components within normal limits  I-STAT CG4 LACTIC ACID, ED - Abnormal; Notable for the following:    Lactic Acid, Venous 3.72 (*)    All other components within normal limits  I-STAT CG4 LACTIC ACID, ED - Abnormal; Notable for the following:    Lactic Acid, Venous 4.06 (*)    All other components within normal limits  CULTURE, BLOOD (ROUTINE X 2)  CULTURE, BLOOD (ROUTINE X 2)  URINE CULTURE  COMPREHENSIVE METABOLIC PANEL  MAGNESIUM  CBC WITH DIFFERENTIAL  LACTIC ACID, PLASMA  I-STAT CG4 LACTIC ACID, ED    Imaging Review Dg Chest Port 1 View  12/15/2014   CLINICAL DATA:  Weakness, lethargy.  EXAM: PORTABLE CHEST - 1 VIEW  COMPARISON:  Chest radiograph 11/05/2014  FINDINGS: Right sided Port-A-Cath is  present with tip projecting over the right atrium, unchanged. Stable cardiac and mediastinal contours. Elevation of the right hemidiaphragm. Minimal right basilar atelectasis. Re- demonstrated calcified granulomas. Radiodensity projecting over the central right hemi thorax.  IMPRESSION: Radiodensity projecting over the central right hemi thorax nonspecific, potentially external to the patient. Recommend clinical correlation.  Elevation of the right hemidiaphragm with right basilar atelectasis.   Electronically Signed   By: Lovey Newcomer M.D.   On: 12/15/2014 15:10     EKG Interpretation   Date/Time:  Saturday December 15 2014 13:51:32 EST Ventricular Rate:  115 PR Interval:  104 QRS Duration: 73 QT Interval:  311 QTC Calculation: 430 R Axis:   -9 Text Interpretation:  Sinus tachycardia Borderline low voltage, extremity  leads No significant change was found Confirmed by Wyvonnia Dusky  MD, Norine Reddington  [phone]) on 12/15/2014 1:57:52 PM      MDM   Final diagnoses:  Weak  Sepsis, due to unspecified organism  Elevated troponin   pancreatic cancer in hospice with generalized weakness and fatigue. She is tachycardic and dry appearing.  IVF and labs.  Afebrile in the ED.  Denies pain.  Family at bedside agreeable to IVF and antibiotics.  DNR and hospice status confirmed. Lactate 4.  WBC 19.  Anion gap normal.  With tachycardia, code sepsis activated.  No fever. Antibiotics, cultures, IVF.   No clear source in UA or CXR.  Possible atelectasis.  Treat as sepsis.  HR improving.  Mental status good. Mild troponin elevation without chest pain or EKG change. D/w Dr. Sherral Hammers who will admit patient for further therapies.  Family in agreement.  CRITICAL CARE Performed by: Ezequiel Essex Total critical care time: 35 Critical care time was exclusive of separately billable procedures and treating other patients. Critical care was necessary to treat or prevent imminent or life-threatening  deterioration. Critical care was time spent personally by me on the following activities: development of treatment plan with patient and/or surrogate as well as nursing, discussions with consultants, evaluation of patient's response to treatment, examination of patient, obtaining history from patient or surrogate, ordering and performing treatments and interventions, ordering and review of laboratory studies, ordering and review of radiographic studies, pulse oximetry and re-evaluation of patient's condition.    Ezequiel Essex, MD  12/15/14 1916 

## 2014-12-15 NOTE — H&P (Signed)
[facility] History and Physical  Debra [name] TFT:[id number] DOB: Nov 17, 1941 DOA: 12/15/2014  Referring physician:  PCP: Rodena Medin, MD  Specialists:   Chief Complaint:  3 day history of generalized weakness, fatigue and decreased appetite  HPI: Debra [name] is a 74 y.o. 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 Patient has pancreatic cancer and is currently in hospice not receiving any treatment. She denies any recent falls but has not been able to her with her walker which she usually is. She denies any headache, neck pain, chest pain, abdominal pain. She is tachycardic on arrival she endorses poor by mouth intake. She denies any constipation or diarrhea. She denies any dizziness, nausea, vomiting, weakness numbness or tingling.  Review of Systems: The patient denies fever, vision loss, decreased hearing, hoarseness, syncope, dyspnea on exertion, peripheral edema, balance deficits, hemoptysis, abdominal pain, melena, hematochezia, severe indigestion/heartburn, hematuria, incontinence, genital sores, suspicious skin lesions, transient blindness,  depression, abnormal bleeding, enlarged lymph nodes, angioedema, and breast masses.    TRAVEL HISTORY: None   Consultants:  None  Procedure/Significant Events:  None    Culture  1/9 urine pending 1/9 blood pending   Antibiotics:  Aztreonam 1/9>> Levofloxacin 1/9>> Vancomycin 1/9   DVT prophylaxis:  Lovenox  Devices     LINES / TUBES:  Port-A-Cath placement 10/16/2014   Past Medical History  Diagnosis Date  . Type II diabetes mellitus     "just dx'd today' (12/08/2013)  . Hypertension   . Acute renal failure   . DKA (diabetic  ketoacidoses)   . Pancreatic cancer 2015   Past Surgical History  Procedure Laterality Date  . Tubal ligation    . Dental surgery    . Eus N/A 09/28/2014    Procedure: UPPER ENDOSCOPIC ULTRASOUND (EUS) LINEAR;  Surgeon: Beryle Beams, MD;  Location: [facility];  Service: Endoscopy;  Laterality: N/A;   Social History:  reports that she has never smoked. She has never used smokeless tobacco. She reports that she does not drink alcohol or use illicit drugs. where does patient live--home, ALF, home Can patient participate in ADLs? No  Allergies  Allergen Reactions  . Amoxicillin Rash    History reviewed. No pertinent family history.   Prior to Admission medications   Medication Sig Start Date End Date Taking? Authorizing Provider  acetaminophen (TYLENOL) 325 MG tablet Take 2 tablets (650 mg total) by mouth every 6 (six) hours as needed for mild pain. 09/29/14  Yes Venetia Maxon Rama, MD  ibuprofen (ADVIL,MOTRIN) 200 MG tablet Take 200 mg by mouth every 6 (six) hours as needed for mild pain.   Yes Historical Provider, MD  insulin detemir (LEVEMIR) 100 UNIT/ML injection Inject 6 Units into the skin at bedtime.   Yes Historical Provider, MD  lidocaine-prilocaine (EMLA) cream Apply 1 application topically as needed. Apply to [facility] site 1-2 hours prior to stick and cover with plastic wrap 10/11/14  Yes Ladell Pier, MD  lisinopril (PRINIVIL,ZESTRIL) 10 MG tablet Take 1 tablet (10 mg total) by mouth daily. 12/12/13  Yes Eugenie Filler, MD  mirtazapine (REMERON) 15 MG tablet Take 1 tablet (15 mg total) by mouth at bedtime. 11/08/14  Yes Costin Karlyne Greenspan, MD  Multiple Vitamin (MULTIVITAMIN WITH MINERALS) TABS tablet Take  1 tablet by mouth daily. 09/29/14  Yes Christina P Rama, MD  sorbitol 70 % solution Take 30 mLs by mouth 2 (two) times daily. 10/23/14  Yes Owens Shark, NP  Hydrocodone-Acetaminophen (VICODIN) 5-300 MG TABS Take 1 tablet by mouth every 6 (six) hours as needed. Patient not  taking: Reported on 12/15/2014 11/08/14   Caren Griffins, MD  levofloxacin (LEVAQUIN) 500 MG tablet Take 1 tablet (500 mg total) by mouth daily. Patient not taking: Reported on 12/15/2014 11/08/14   Caren Griffins, MD  metoCLOPramide (REGLAN) 5 MG tablet Take 1 tablet (5 mg total) by mouth 3 (three) times daily before meals. Patient not taking: Reported on 12/15/2014 10/11/14   Ladell Pier, MD  ondansetron (ZOFRAN) 4 MG tablet Take 1 tablet (4 mg total) by mouth every 6 (six) hours as needed for nausea. Patient not taking: Reported on 12/15/2014 09/29/14   Venetia Maxon Rama, MD  traMADol (ULTRAM) 50 MG tablet Take 1 tablet (50 mg total) by mouth every 6 (six) hours as needed for moderate pain. Patient not taking: Reported on 12/15/2014 10/11/14   Ladell Pier, MD   Physical Exam: Filed Vitals:   12/15/14 1630 12/15/14 1700 12/15/14 1703 12/15/14 1732  BP: 155/85 156/89  156/90  Pulse: 112 111  115  Temp:    98.7 F (37.1 C)  TempSrc:    Oral  Resp:   13 20  Height:    5\' 1"  (1.549 m)  Weight:    43.993 kg (96 lb 15.8 oz)  SpO2: 100% 100%  100%     General: A/O 4, chest wall pain around port, cachectic  Eyes: Pupils equal round reactive to light and accommodation  Cardiovascular: Tachycardic, regular rhythm, negative murmurs rubs or gallops, normal S1/S2  Respiratory: Clear to auscultation bilateral  Abdomen: Pain to palpation epigastric  Musculoskeletal: Negative pedal edema, negative cyanosis   Labs on Admission:  Basic Metabolic Panel:  Recent Labs Lab 12/15/14 1339  NA 134*  K 3.7  CL 99  CO2 25  GLUCOSE 144*  BUN 22  CREATININE 0.54  CALCIUM 9.9   Liver Function Tests:  Recent Labs Lab 12/15/14 1339  AST 20  ALT 10  ALKPHOS 233*  BILITOT 0.5  PROT 6.6  ALBUMIN 1.8*   No results for input(s): LIPASE, AMYLASE in the last 168 hours. No results for input(s): AMMONIA in the last 168 hours. CBC:  Recent Labs Lab 12/15/14 1339  WBC 19.5*  NEUTROABS  16.4*  HGB 9.2*  HCT 28.3*  MCV 86.5  PLT 373   Cardiac Enzymes:  Recent Labs Lab 12/15/14 1339  TROPONINI 0.23*    BNP (last 3 results) No results for input(s): PROBNP in the last 8760 hours. CBG: No results for input(s): GLUCAP in the last 168 hours.  Radiological Exams on Admission: Dg Chest Port 1 View  12/15/2014   CLINICAL DATA:  Weakness, lethargy.  EXAM: PORTABLE CHEST - 1 VIEW  COMPARISON:  Chest radiograph 11/05/2014  FINDINGS: Right sided Port-A-Cath is present with tip projecting over the right atrium, unchanged. Stable cardiac and mediastinal contours. Elevation of the right hemidiaphragm. Minimal right basilar atelectasis. Re- demonstrated calcified granulomas. Radiodensity projecting over the central right hemi thorax.  IMPRESSION: Radiodensity projecting over the central right hemi thorax nonspecific, potentially external to the patient. Recommend clinical correlation.  Elevation of the right hemidiaphragm with right basilar atelectasis.   Electronically Signed   By: Lovey Newcomer M.D.   On: 12/15/2014  15:10    EKG:   Assessment/Plan Active Problems:   HTN (hypertension)   Protein-calorie malnutrition, severe   Uncontrolled diabetes mellitus with complications   Pancreatic carcinoma metastatic to liver   Sepsis   Pancreatic cancer metastasized to liver  Sepsis -Consult IV team to access port  -Normal saline 150 ml/hr - continue current antibiotic regimen   Pancreatic carcinoma metastatic to liver (adenocarcinoma pancreas body/tail)  -Contact CSW in a.m. and clarify hospice care orders; patient and family not sure they completely understand intricacies of current plan of care  Diabetes type 2, uncontrolled -Regular diet -Start sensitive SSI  HTN  -Patient currently dehydrated, will allow permissive HTN  Protein calorie malnutrition severe -Regular diet -Ensure TID   Code Status: DNR   Family Communication: Daughter and son present Disposition  Plan:  Clarify plan of care with CSW and palliative care/hospice agency  Time spent: 60 minutes     Allie Bossier [facility] Pager [phone]  If 7PM-7AM, please contact night-coverage [email or web address] Password [facility] 12/15/2014, 6:25 PM

## 2014-12-15 NOTE — ED Notes (Signed)
Per ems, called to patient residence for increasing weakness, lethargy over last 3-4 days.  Pt arrives awake, somnolent, oriented to person, place, year, event, mae, perrl

## 2014-12-15 NOTE — Progress Notes (Signed)
ANTIBIOTIC CONSULT NOTE - INITIAL  Pharmacy Consult for Aztreonam, Vancomycin, Levaquin Indication: sepsis  Allergies  Allergen Reactions  . Amoxicillin Rash    Patient Measurements: Height: 5\' 1"  (154.9 cm) Weight: 95 lb (43.092 kg) IBW/kg (Calculated) : 47.8   Labs:  Recent Labs  12/15/14 1339  WBC 19.5*  HGB 9.2*  PLT 373  CREATININE 0.54   Estimated Creatinine Clearance: 41.3 mL/min (by C-G formula based on Cr of 0.54). No results for input(s): VANCOTROUGH, VANCOPEAK, VANCORANDOM, GENTTROUGH, GENTPEAK, GENTRANDOM, TOBRATROUGH, TOBRAPEAK, TOBRARND, AMIKACINPEAK, AMIKACINTROU, AMIKACIN in the last 72 hours.   Microbiology: No results found for this or any previous visit (from the past 720 hour(s)).  Medical History: Past Medical History  Diagnosis Date  . Type II diabetes mellitus     "just dx'd today' (12/08/2013)  . Hypertension   . Acute renal failure   . DKA (diabetic ketoacidoses)   . Pancreatic cancer 2015    Assessment: 74 year old female with PMH history including pancreatic cancer, HTN, and Type II DM admitted with increasing weakness and lethargy.  Pharmacy asked to begin Vancomycin, Levaquin, and Aztreonam.   Goal of Therapy:  Vancomycin trough level 15-20 mcg/ml  Appropriate Levaquin and aztreonam dosing  Plan:  Levaquin 750 mg iv Q 48 hours Aztreonam 1 gram iv Q 8 hours Vancomycin 1 gram iv Q 24 hours Follow up fever trend, cultures, Scr  Thank you Anette Guarneri, PharmD [phone]  12/15/2014,3:20 PM

## 2014-12-15 NOTE — Progress Notes (Signed)
Report received from [place] from ED for patient to be admitted into [id number]

## 2014-12-16 ENCOUNTER — Inpatient Hospital Stay (HOSPITAL_COMMUNITY)

## 2014-12-16 DIAGNOSIS — A419 Sepsis, unspecified organism: Secondary | ICD-10-CM | POA: Diagnosis present

## 2014-12-16 DIAGNOSIS — Z66 Do not resuscitate: Secondary | ICD-10-CM | POA: Diagnosis present

## 2014-12-16 DIAGNOSIS — I10 Essential (primary) hypertension: Secondary | ICD-10-CM | POA: Diagnosis present

## 2014-12-16 DIAGNOSIS — I471 Supraventricular tachycardia: Secondary | ICD-10-CM

## 2014-12-16 DIAGNOSIS — D490 Neoplasm of unspecified behavior of digestive system: Secondary | ICD-10-CM | POA: Diagnosis present

## 2014-12-16 DIAGNOSIS — R609 Edema, unspecified: Secondary | ICD-10-CM | POA: Diagnosis not present

## 2014-12-16 DIAGNOSIS — R531 Weakness: Secondary | ICD-10-CM | POA: Diagnosis present

## 2014-12-16 DIAGNOSIS — M79609 Pain in unspecified limb: Secondary | ICD-10-CM | POA: Diagnosis not present

## 2014-12-16 DIAGNOSIS — Z79899 Other long term (current) drug therapy: Secondary | ICD-10-CM | POA: Diagnosis not present

## 2014-12-16 DIAGNOSIS — Z880 Allergy status to penicillin: Secondary | ICD-10-CM | POA: Diagnosis not present

## 2014-12-16 DIAGNOSIS — C787 Secondary malignant neoplasm of liver and intrahepatic bile duct: Secondary | ICD-10-CM | POA: Diagnosis present

## 2014-12-16 DIAGNOSIS — Z681 Body mass index (BMI) 19 or less, adult: Secondary | ICD-10-CM | POA: Diagnosis not present

## 2014-12-16 DIAGNOSIS — D649 Anemia, unspecified: Secondary | ICD-10-CM | POA: Diagnosis present

## 2014-12-16 DIAGNOSIS — D638 Anemia in other chronic diseases classified elsewhere: Secondary | ICD-10-CM | POA: Diagnosis present

## 2014-12-16 DIAGNOSIS — R627 Adult failure to thrive: Secondary | ICD-10-CM | POA: Diagnosis present

## 2014-12-16 DIAGNOSIS — Z791 Long term (current) use of non-steroidal anti-inflammatories (NSAID): Secondary | ICD-10-CM | POA: Diagnosis not present

## 2014-12-16 DIAGNOSIS — R000 Tachycardia, unspecified: Secondary | ICD-10-CM

## 2014-12-16 DIAGNOSIS — R64 Cachexia: Secondary | ICD-10-CM | POA: Diagnosis present

## 2014-12-16 DIAGNOSIS — C259 Malignant neoplasm of pancreas, unspecified: Secondary | ICD-10-CM | POA: Diagnosis present

## 2014-12-16 DIAGNOSIS — K922 Gastrointestinal hemorrhage, unspecified: Secondary | ICD-10-CM | POA: Diagnosis present

## 2014-12-16 DIAGNOSIS — E872 Acidosis, unspecified: Secondary | ICD-10-CM

## 2014-12-16 DIAGNOSIS — E860 Dehydration: Secondary | ICD-10-CM | POA: Diagnosis present

## 2014-12-16 DIAGNOSIS — E43 Unspecified severe protein-calorie malnutrition: Secondary | ICD-10-CM | POA: Diagnosis present

## 2014-12-16 DIAGNOSIS — Z515 Encounter for palliative care: Secondary | ICD-10-CM | POA: Diagnosis not present

## 2014-12-16 DIAGNOSIS — Z794 Long term (current) use of insulin: Secondary | ICD-10-CM | POA: Diagnosis not present

## 2014-12-16 DIAGNOSIS — E1165 Type 2 diabetes mellitus with hyperglycemia: Secondary | ICD-10-CM | POA: Diagnosis present

## 2014-12-16 LAB — CBC WITH DIFFERENTIAL/PLATELET
BASOS PCT: 0 % (ref 0–1)
Basophils Absolute: 0 10*3/uL (ref 0.0–0.1)
EOS ABS: 0.3 10*3/uL (ref 0.0–0.7)
EOS PCT: 2 % (ref 0–5)
HCT: 26.2 % — ABNORMAL LOW (ref 36.0–46.0)
Hemoglobin: 8.4 g/dL — ABNORMAL LOW (ref 12.0–15.0)
Lymphocytes Relative: 7 % — ABNORMAL LOW (ref 12–46)
Lymphs Abs: 1.3 10*3/uL (ref 0.7–4.0)
MCH: 28 pg (ref 26.0–34.0)
MCHC: 32.1 g/dL (ref 30.0–36.0)
MCV: 87.3 fL (ref 78.0–100.0)
MONOS PCT: 7 % (ref 3–12)
Monocytes Absolute: 1.2 10*3/uL — ABNORMAL HIGH (ref 0.1–1.0)
NEUTROS ABS: 14.5 10*3/uL — AB (ref 1.7–7.7)
Neutrophils Relative %: 84 % — ABNORMAL HIGH (ref 43–77)
PLATELETS: 334 10*3/uL (ref 150–400)
RBC: 3 MIL/uL — ABNORMAL LOW (ref 3.87–5.11)
RDW: 17 % — ABNORMAL HIGH (ref 11.5–15.5)
WBC: 17.3 10*3/uL — ABNORMAL HIGH (ref 4.0–10.5)

## 2014-12-16 LAB — GLUCOSE, CAPILLARY
Glucose-Capillary: 106 mg/dL — ABNORMAL HIGH (ref 70–99)
Glucose-Capillary: 108 mg/dL — ABNORMAL HIGH (ref 70–99)
Glucose-Capillary: 108 mg/dL — ABNORMAL HIGH (ref 70–99)
Glucose-Capillary: 110 mg/dL — ABNORMAL HIGH (ref 70–99)
Glucose-Capillary: 92 mg/dL (ref 70–99)
Glucose-Capillary: 99 mg/dL (ref 70–99)

## 2014-12-16 LAB — COMPREHENSIVE METABOLIC PANEL
ALT: 8 U/L (ref 0–35)
AST: 17 U/L (ref 0–37)
Albumin: 1.6 g/dL — ABNORMAL LOW (ref 3.5–5.2)
Alkaline Phosphatase: 202 U/L — ABNORMAL HIGH (ref 39–117)
Anion gap: 8 (ref 5–15)
BILIRUBIN TOTAL: 0.6 mg/dL (ref 0.3–1.2)
BUN: 21 mg/dL (ref 6–23)
CHLORIDE: 108 meq/L (ref 96–112)
CO2: 22 mmol/L (ref 19–32)
Calcium: 9.1 mg/dL (ref 8.4–10.5)
Creatinine, Ser: 0.56 mg/dL (ref 0.50–1.10)
GFR calc Af Amer: >90 mL/min (ref >90)
GFR, EST NON AFRICAN AMERICAN: 89 mL/min — AB (ref >90)
Glucose, Bld: 114 mg/dL — ABNORMAL HIGH (ref 70–99)
POTASSIUM: 3.7 mmol/L (ref 3.5–5.1)
Sodium: 138 mmol/L (ref 135–145)
Total Protein: 5.8 g/dL — ABNORMAL LOW (ref 6.0–8.3)

## 2014-12-16 LAB — LACTIC ACID, PLASMA: Lactic Acid, Venous: 2.8 mmol/L — ABNORMAL HIGH (ref 0.5–2.2)

## 2014-12-16 LAB — MAGNESIUM: MAGNESIUM: 1.5 mg/dL (ref 1.5–2.5)

## 2014-12-16 MED ORDER — IOHEXOL 300 MG/ML  SOLN
25.0000 mL | INTRAMUSCULAR | Status: AC
  Administered 2014-12-16 (×2): 25 mL via ORAL

## 2014-12-16 MED ORDER — IOHEXOL 300 MG/ML  SOLN
100.0000 mL | Freq: Once | INTRAMUSCULAR | Status: AC | PRN
  Administered 2014-12-16: 100 mL via INTRAVENOUS

## 2014-12-16 NOTE — Progress Notes (Signed)
GIP visit [facility] RN  Related admission for Sepsis to hospice diagnosis CA Pancreas. Patient is a DNR. Patient is alert laying the bed having some abdominal pain. Daughters at bedside. Vaughan Basta RN is getting patient some tylenol for abdominal pain. No concerns at this time. Patient has a CT scan of the abdomen.   Please call with any hospice concerns [phone] Thank you Ardath Sax RN

## 2014-12-16 NOTE — Progress Notes (Signed)
PROGRESS NOTE  Evanie Buckle JKK:[id number] DOB: 03-28-1941 DOA: 12/15/2014 PCP: Rodena Medin, MD Brief history 74 year old female with a history of metastatic pancreatic cancer to the liver, hypertension, diabetes mellitus type 2 who is admitted with 3 days of generalized weakness, fatigue, and decreased oral intake. The patient was recently discharged from the hospital on 11/08/2014 with similar presentation after mechanical fall. At that time, the decision was made to discharge the patient home with hospice care. The patient  denies fevers, chills, chest pain, shortness of breath, vomiting, diarrhea, dysuria. She has some epigastric discomfort. Assessment/Plan: Leukocytosis/tachycardia/lactic acidosis -Continue empiric vancomycin and aztreonam pending culture data -Discontinue levofloxacin -Follow up blood cultures  -Urinalysis did not show any pyuria  -Chest x-ray negative for consolidations or pulmonary edema  -IV fluids  -given pt's abdominal pain and current presentation-->CT abdomen and pelvis Hypercalcemia -Corrected calcium 11.6 on the day of admission -Multifactorial including dehydration, immobility, and possibly malignancy -Improving with IV fluids Generalized weakness -Multifactorial including deconditioning, infection, dehydration, anemia, metastatic pancreatic cancer -A.m. cortisol  -TSH  -Serum B12  Severe protein calorie malnutrition  -Nutrition consult  -Nutrition supplementation  Sinus tachycardia  -Multifactorial including dehydration, and anemia, infectious process, -This has been a chronic problem  -Patient without any chest discomfort or shortness of breath  Diabetes mellitus type 2 -09/26/2014 hemoglobin A1c 8.4 -Given the patient's age and present medical conditions, liberalize diet -novolog sliding scale short term -d/c levemir for now Metastatic pancreatic adenocarcinoma -Placed on Dr. Gearldine Shown list   Family Communication:   Updated  sister [name] on phone 12/16/14 Disposition Plan:   Home when medically stable      Procedures/Studies: Dg Chest Port 1 View  12/15/2014   CLINICAL DATA:  Weakness, lethargy.  EXAM: PORTABLE CHEST - 1 VIEW  COMPARISON:  Chest radiograph 11/05/2014  FINDINGS: Right sided Port-A-Cath is present with tip projecting over the right atrium, unchanged. Stable cardiac and mediastinal contours. Elevation of the right hemidiaphragm. Minimal right basilar atelectasis. Re- demonstrated calcified granulomas. Radiodensity projecting over the central right hemi thorax.  IMPRESSION: Radiodensity projecting over the central right hemi thorax nonspecific, potentially external to the patient. Recommend clinical correlation.  Elevation of the right hemidiaphragm with right basilar atelectasis.   Electronically Signed   By: Lovey Newcomer M.D.   On: 12/15/2014 15:10         Subjective:  patient complains of abdominal pain. She denies any fevers, chills, chest pain, shortness breath, vomiting, diarrhea, dysuria, hematuria. She had last bowel movement 2 days ago.   Objective: Filed Vitals:   12/15/14 1703 12/15/14 1732 12/15/14 2142 12/16/14 0605  BP:  156/90 142/83 147/85  Pulse:  115  111  Temp:  98.7 F (37.1 C) 98.6 F (37 C) 99.1 F (37.3 C)  TempSrc:  Oral Oral Oral  Resp: 13 20 16 18   Height:  5\' 1"  (1.549 m)    Weight:  43.993 kg (96 lb 15.8 oz)    SpO2:  100% 100% 99%    Intake/Output Summary (Last 24 hours) at 12/16/14 0849 Last data filed at 12/16/14 0834  Gross per 24 hour  Intake 1377.5 ml  Output    275 ml  Net 1102.5 ml   Weight change:  Exam:   General:  Pt is alert, follows commands appropriately, not in acute distress  HEENT: No icterus, No thrush, No meningismus, [place]/AT  Cardiovascular: RRR, S1/S2, no rubs, no gallops  Respiratory: diminished breath sounds  right base. Left clear to auscultation. No wheezing.  Abdomen: Soft/epigastric tenderness without rebound, non  tender, non distended, no guarding  Extremities: No edema, No lymphangitis, No petechiae, No rashes, no synovitis  Data Reviewed: Basic Metabolic Panel:  Recent Labs Lab 12/15/14 1339 12/16/14 0630  NA 134* 138  K 3.7 3.7  CL 99 108  CO2 25 22  GLUCOSE 144* 114*  BUN 22 21  CREATININE 0.54 0.56  CALCIUM 9.9 9.1  MG  --  1.5   Liver Function Tests:  Recent Labs Lab 12/15/14 1339 12/16/14 0630  AST 20 17  ALT 10 8  ALKPHOS 233* 202*  BILITOT 0.5 0.6  PROT 6.6 5.8*  ALBUMIN 1.8* 1.6*   No results for input(s): LIPASE, AMYLASE in the last 168 hours. No results for input(s): AMMONIA in the last 168 hours. CBC:  Recent Labs Lab 12/15/14 1339 12/16/14 0630  WBC 19.5* 17.3*  NEUTROABS 16.4* 14.5*  HGB 9.2* 8.4*  HCT 28.3* 26.2*  MCV 86.5 87.3  PLT 373 334   Cardiac Enzymes:  Recent Labs Lab 12/15/14 1339  TROPONINI 0.23*   BNP: Invalid input(s): POCBNP CBG:  Recent Labs Lab 12/15/14 2121 12/16/14 0044 12/16/14 0407 12/16/14 0812  GLUCAP 104* 108* 108* 106*    No results found for this or any previous visit (from the past 240 hour(s)).   Scheduled Meds: . antiseptic oral rinse  7 mL Mouth Rinse q12n4p  . aspirin EC  81 mg Oral Daily  . aztreonam  1 g Intravenous 3 times per day  . chlorhexidine  15 mL Mouth Rinse BID  . enoxaparin (LOVENOX) injection  40 mg Subcutaneous Q24H  . feeding supplement (ENSURE COMPLETE)  237 mL Oral TID BM  . insulin aspart  0-9 Units Subcutaneous 6 times per day  . [START ON 12/17/2014] levofloxacin (LEVAQUIN) IV  750 mg Intravenous Q48H  . sodium chloride  10-40 mL Intracatheter Q12H  . vancomycin  1,000 mg Intravenous Q24H   Continuous Infusions: . sodium chloride 150 mL/hr at 12/16/14 0508     Levita Monical, DO  [facility] Pager [phone]  If 7PM-7AM, please contact night-coverage [email or web address] Password [email or web address] 12/16/2014, 8:49 AM   LOS: 1 day

## 2014-12-16 NOTE — Progress Notes (Signed)
Called IV team to check pt's PAC. Patient c/o pain 10/10 at [facility] site. IV team called and retaped dressing. Patient stated that did provide some relief. Still states pain is 9/10 but refused any narcotics. Patient asked for Tylenol instead. Tylenol given. Will continue to monitor.

## 2014-12-16 NOTE — Progress Notes (Signed)
NURSING PROGRESS NOTE  Debra [name] [id number] Admission Data: 12/16/2014 5:34 PM Attending Provider: Orson Eva, MD PCP:Cho, Gwyndolyn Saxon, MD Code Status: DNR  Debra [name] is a 74 y.o. female patient admitted from ED:  -No acute distress noted.  -No complaints of shortness of breath.  -No complaints of chest pain.   Blood pressure 151/79, pulse 114, temperature 97.9 F (36.6 C), temperature source Oral, resp. rate 18, height 5\' 1"  (1.549 m), weight 43.993 kg (96 lb 15.8 oz), SpO2 100 %.   IV Fluids: R chest port-a-cath intact with NS infusing   Allergies:  Amoxicillin  Past Medical History:   has a past medical history of Type II diabetes mellitus; Hypertension; Acute renal failure; DKA (diabetic ketoacidoses); and Pancreatic cancer (2015).  Past Surgical History:   has past surgical history that includes Tubal ligation; Dental surgery; and EUS (N/A, 09/28/2014).  Skin: intact   Patient/Family orientated to room. Information packet given to patient/family. Admission inpatient armband information verified with patient/family to include name and date of birth and placed on patient arm. Side rails up x 2, fall assessment and education completed with patient/family. Patient/family able to verbalize understanding of risk associated with falls and verbalized understanding to call for assistance before getting out of bed. Call light within reach. Patient/family able to voice and demonstrate understanding of unit orientation instructions.    Will continue to evaluate and treat per MD orders.  Wallie Renshaw, RN

## 2014-12-17 LAB — CBC WITH DIFFERENTIAL/PLATELET
Basophils Absolute: 0 10*3/uL (ref 0.0–0.1)
Basophils Relative: 0 % (ref 0–1)
EOS PCT: 3 % (ref 0–5)
Eosinophils Absolute: 0.5 10*3/uL (ref 0.0–0.7)
HCT: 26.1 % — ABNORMAL LOW (ref 36.0–46.0)
Hemoglobin: 8.4 g/dL — ABNORMAL LOW (ref 12.0–15.0)
LYMPHS ABS: 1.2 10*3/uL (ref 0.7–4.0)
Lymphocytes Relative: 6 % — ABNORMAL LOW (ref 12–46)
MCH: 28.4 pg (ref 26.0–34.0)
MCHC: 32.2 g/dL (ref 30.0–36.0)
MCV: 88.2 fL (ref 78.0–100.0)
MONOS PCT: 6 % (ref 3–12)
Monocytes Absolute: 1.3 10*3/uL — ABNORMAL HIGH (ref 0.1–1.0)
NEUTROS PCT: 85 % — AB (ref 43–77)
Neutro Abs: 17.6 10*3/uL — ABNORMAL HIGH (ref 1.7–7.7)
Platelets: 334 10*3/uL (ref 150–400)
RBC: 2.96 MIL/uL — ABNORMAL LOW (ref 3.87–5.11)
RDW: 17.2 % — ABNORMAL HIGH (ref 11.5–15.5)
WBC: 20.7 10*3/uL — ABNORMAL HIGH (ref 4.0–10.5)

## 2014-12-17 LAB — HEMOGLOBIN A1C
Hgb A1c MFr Bld: 5.7 % — ABNORMAL HIGH (ref <5.7)
MEAN PLASMA GLUCOSE: 117 mg/dL — AB (ref <117)

## 2014-12-17 LAB — URINE CULTURE
Colony Count: NO GROWTH
Culture: NO GROWTH

## 2014-12-17 LAB — CORTISOL-AM, BLOOD: Cortisol - AM: 28.7 ug/dL — ABNORMAL HIGH (ref 4.3–22.4)

## 2014-12-17 LAB — COMPREHENSIVE METABOLIC PANEL
ALBUMIN: 1.5 g/dL — AB (ref 3.5–5.2)
ALT: 8 U/L (ref 0–35)
AST: 18 U/L (ref 0–37)
Alkaline Phosphatase: 215 U/L — ABNORMAL HIGH (ref 39–117)
Anion gap: 7 (ref 5–15)
BUN: 19 mg/dL (ref 6–23)
CALCIUM: 8.7 mg/dL (ref 8.4–10.5)
CHLORIDE: 108 meq/L (ref 96–112)
CO2: 19 mmol/L (ref 19–32)
CREATININE: 0.53 mg/dL (ref 0.50–1.10)
GLUCOSE: 100 mg/dL — AB (ref 70–99)
Potassium: 3.1 mmol/L — ABNORMAL LOW (ref 3.5–5.1)
Sodium: 134 mmol/L — ABNORMAL LOW (ref 135–145)
Total Bilirubin: 0.5 mg/dL (ref 0.3–1.2)
Total Protein: 5.5 g/dL — ABNORMAL LOW (ref 6.0–8.3)

## 2014-12-17 LAB — GLUCOSE, CAPILLARY
GLUCOSE-CAPILLARY: 110 mg/dL — AB (ref 70–99)
GLUCOSE-CAPILLARY: 114 mg/dL — AB (ref 70–99)
Glucose-Capillary: 95 mg/dL (ref 70–99)
Glucose-Capillary: 143 mg/dL — ABNORMAL HIGH (ref 70–99)

## 2014-12-17 LAB — VITAMIN B12: VITAMIN B 12: 1595 pg/mL — AB (ref 211–911)

## 2014-12-17 LAB — CLOSTRIDIUM DIFFICILE BY PCR: Toxigenic C. Difficile by PCR: NEGATIVE

## 2014-12-17 LAB — TSH: TSH: 2.819 u[IU]/mL (ref 0.350–4.500)

## 2014-12-17 LAB — LACTIC ACID, PLASMA: Lactic Acid, Venous: 2.9 mmol/L — ABNORMAL HIGH (ref 0.5–2.2)

## 2014-12-17 LAB — MAGNESIUM: MAGNESIUM: 1.5 mg/dL (ref 1.5–2.5)

## 2014-12-17 NOTE — Progress Notes (Signed)
INITIAL NUTRITION ASSESSMENT  DOCUMENTATION CODES Per approved criteria  -Severe malnutrition in the context of chronic illness   Pt meets criteria for severe MALNUTRITION in the context of chronic illness as evidenced by 42.9% wt loss x 1 year, <75% estimated energy intake x > 1 month.  INTERVENTION: -Continue Ensure Complete po BID, each supplement provides 350 kcal and 13 grams of protein -RD to follow for goals of care  NUTRITION DIAGNOSIS: Inadequate oral intake related to decreased appetite as evidenced by PO: 0-25%.   Goal: Pt will meet >90% of estimated nutritional needs  Monitor:  PO/supplement intake, labs, weight changes, I/O's, goals of care  Reason for Assessment: MST=2  74 y.o. female  Admitting Dx: <principal problem not specified>  74 year old female with a history of metastatic pancreatic cancer to the liver, hypertension, diabetes mellitus type 2 who is admitted with 3 days of generalized weakness, fatigue, and decreased oral intake. The patient was recently discharged from the hospital on 11/08/2014 with similar presentation after mechanical fall. At that time, the decision was made to discharge the patient home with hospice care. The patient denies fevers, chills, chest pain, shortness of breath, vomiting, diarrhea, dysuria. She has some epigastric discomfort.  ASSESSMENT: Pt with hx of metastatic pancreatic cancer who is followed by Hospice and [place]. Per MD notes, oncologist to speak with family about leaning towards comfort care.  Pt with decline in health over the past week. She is followed by [facility]. She is followed by RD at [facility]. She has a chronic poor appetite and has experienced progressive wt loss over the past year. Documented wt hx reveals a 72# (42.9%) wt loss over the past year. Intake remains poor since admission (PO: 0-25%). Also noted orders for Ensure Complete BID.  Labs reviewed. Na: 134, K: 3.1, Glucose: 100. CBGS:  95-113.  Height: Ht Readings from Last 1 Encounters:  12/15/14 5\' 1"  (1.549 m)    Weight: Wt Readings from Last 1 Encounters:  12/15/14 96 lb 15.8 oz (43.993 kg)    Ideal Body Weight: 105#  % Ideal Body Weight: 91%  Wt Readings from Last 10 Encounters:  12/15/14 96 lb 15.8 oz (43.993 kg)  11/06/14 98 lb 12.8 oz (44.815 kg)  11/02/14 96 lb 6.4 oz (43.727 kg)  10/23/14 99 lb 12.8 oz (45.269 kg)  10/17/14 103 lb 3.2 oz (46.811 kg)  10/11/14 102 lb 1.6 oz (46.312 kg)  09/26/14 101 lb 3.2 oz (45.904 kg)  12/08/13 168 lb 10.4 oz (76.5 kg)    Usual Body Weight: 168#  % Usual Body Weight: 57%  BMI:  Body mass index is 18.33 kg/(m^2). Underweight  Estimated Nutritional Needs: Kcal: 1100-1300 Protein: 44-54 grams Fluid: 1.1-1.3 L  Skin: WDL  Diet Order: Diet regular  EDUCATION NEEDS: -Education not appropriate at this time   Intake/Output Summary (Last 24 hours) at 12/17/14 1312 Last data filed at 12/17/14 1108  Gross per 24 hour  Intake    170 ml  Output      0 ml  Net    170 ml    Last BM: 12/16/14  Labs:   Recent Labs Lab 12/15/14 1339 12/16/14 0630 12/17/14 0445  NA 134* 138 134*  K 3.7 3.7 3.1*  CL 99 108 108  CO2 25 22 19   BUN 22 21 19   CREATININE 0.54 0.56 0.53  CALCIUM 9.9 9.1 8.7  MG  --  1.5 1.5  GLUCOSE 144* 114* 100*    CBG (last  3)   Recent Labs  12/17/14 0038 12/17/14 0437 12/17/14 0817  GLUCAP 110* 114* 95    Scheduled Meds: . antiseptic oral rinse  7 mL Mouth Rinse q12n4p  . aspirin EC  81 mg Oral Daily  . aztreonam  1 g Intravenous 3 times per day  . chlorhexidine  15 mL Mouth Rinse BID  . enoxaparin (LOVENOX) injection  40 mg Subcutaneous Q24H  . feeding supplement (ENSURE COMPLETE)  237 mL Oral TID BM  . sodium chloride  10-40 mL Intracatheter Q12H  . vancomycin  1,000 mg Intravenous Q24H    Continuous Infusions: . sodium chloride 100 mL/hr at 12/17/14 0515    Past Medical History  Diagnosis Date  . Type II  diabetes mellitus     "just dx'd today' (12/08/2013)  . Hypertension   . Acute renal failure   . DKA (diabetic ketoacidoses)   . Pancreatic cancer 2015    Past Surgical History  Procedure Laterality Date  . Tubal ligation    . Dental surgery    . Eus N/A 09/28/2014    Procedure: UPPER ENDOSCOPIC ULTRASOUND (EUS) LINEAR;  Surgeon: Beryle Beams, MD;  Location: [facility];  Service: Endoscopy;  Laterality: N/A;    Wallis Spizzirri A. Jimmye Norman, RD, LDN, CDE Pager: [phone] After hours Pager: [phone]

## 2014-12-17 NOTE — Progress Notes (Signed)
Inpatient RN visit- [facility] 5 W Room [place] [facility] RN Visit-Karen Alford Highland RN. Patient admitted from home for evaluation of dehydration, decreased po intake and functional decline noticed by family. [facility] was notified prior to transport via EMS.  Related admission to [facility] diagnosis of Pancreatic Ca.  Pt is DNR  code.   Patient seen at bedside, daughter [name] present during visit, [facility] LCSW Lorriane Shire also arrived during visit. Pt seen lying in bed, eyes open, reports she feels "some better" than when she was admitted. She remains lethargic with poor po intake, 25% of breakfast. [name] had several questions regarding hospice services, mainly "what was her mother coded at". After much discussion is was discovered that the attending physician had mentioned " the umbrella of Palliative care, hospice being under this. Explanation given to [facility] regarding Hospice being focused on comfort at end of life, palliating symptoms. She expressed understanding.  [name] recalled the decline she had seen in her mother from Wednesday of last week to Friday, this quick decline was unexpected. Writer gently explained that this can happen in the end of life, again she expressed her understanding. Patient is no longer ambulating, she requested to be repositioned during visit and was unable to turn her self w/o assistance. She is currently recvg IVF @ 100 ml/hr and IV abt. Current plan is for [name] to discuss with her family (sister + father) and the patient about what she wants in regards to further interventions/treatment. [facility] will continue to follow and provide support.  Patient's home medication list, transfer summary and OOF DNR in place on shadow chart.  Please call [facility] @ [phone]-  with any hospice needs.   Thank you. Tracey Harries, RN  [facility]  Hospice Liaison  [phone])

## 2014-12-17 NOTE — Care Management Note (Unsigned)
    Page 1 of 1   12/17/2014     7:16:17 PM CARE MANAGEMENT NOTE 12/17/2014  Patient:  MERA, GUNKEL   Account Number:  [id number]  Date Initiated:  12/17/2014  Documentation initiated by:  Tomi Bamberger  Subjective/Objective Assessment:   dx sepsis  admit- from home with [facility]     Action/Plan:   Anticipated DC Date:  12/19/2014   Anticipated DC Plan:  [place]  CM consult      Choice offered to / List presented to:             Status of service:  In process, will continue to follow Medicare Important Message given?  YES (If response is "NO", the following Medicare IM given date fields will be blank) Date Medicare IM given:  12/17/2014 Medicare IM given by:  Tomi Bamberger Date Additional Medicare IM given:   Additional Medicare IM given by:    Discharge Disposition:    Per UR Regulation:    If discussed at Long Length of Stay Meetings, dates discussed:    Comments:  12/17/14 [place] RN, BSN [phone]  patient is active with [facility], plan is to go back home with [facility] at dc.

## 2014-12-17 NOTE — Progress Notes (Signed)
PROGRESS NOTE  Debra [name] TKP:[id number] DOB: 04-23-1941 DOA: 12/15/2014 PCP: Rodena Medin, MD  Brief history 74 year old female with a history of metastatic pancreatic cancer to the liver, hypertension, diabetes mellitus type 2 who is admitted with 3 days of generalized weakness, fatigue, and decreased oral intake. The patient was recently discharged from the hospital on 11/08/2014 with similar presentation after mechanical fall. At that time, the decision was made to discharge the patient home with hospice care. The patient denies fevers, chills, chest pain, shortness of breath, vomiting, diarrhea, dysuria. She has some epigastric discomfort. Assessment/Plan: Leukocytosis/tachycardia/lactic acidosis -Continue empiric vancomycin and aztreonam pending culture data -Discontinue levofloxacin -Follow up blood cultures--neg to date -Urinalysis did not show any pyuria  -Chest x-ray negative for consolidations or pulmonary edema  -IV fluids  -given pt's abdominal pain and current presentation-->CT abdomen and pelvis -12/15/2014 CT abdomen pelvis--shows progression of pancreatic cancer and liver metastasis. There is also a new 2.9 cm sigmoid mass -leukocytosis may be reactive and a reflection of pt's metastatic cancer with bone marrow reactive response Hypercalcemia -Corrected calcium 11.6 on the day of admission -Multifactorial including dehydration, immobility, and possibly malignancy -Improving with IV fluids -12/17/2014--corrected calcium 10.7 Generalized weakness -Multifactorial including deconditioning, infection, dehydration, anemia, metastatic pancreatic cancer -A.m. cortisol  -TSH--2.819 -Serum B12--pending Severe protein calorie malnutrition  -Nutrition consult  -Nutrition supplementation  Sinus tachycardia  -Multifactorial including dehydration, and anemia, infectious process, -This has been a chronic problem  -Patient without any chest discomfort or  shortness of breath  Diabetes mellitus type 2 -09/26/2014 hemoglobin A1c 8.4 -Given the patient's age and present medical conditions, liberalize diet -novolog sliding scale short term -d/c levemir for now -CBGs controlled -Given the patient's present clinical condition, discontinue all CBG checks Metastatic pancreatic adenocarcinoma -CT scan abdomen and pelvis shows progression of pancreatic adenocarcinoma as well as new sigmoid colon mass -Case was discussed with the patient's oncologist Dr. Benay Spice he agreed with discussion with family toward a focus of comfort care   Communication--spoke with daughter [name] on phone 12/17/14 Dispo--family considering comfort care--home with hospice 12/18/14 Procedures/Studies: Ct Abdomen Pelvis W Contrast  12/16/2014   CLINICAL DATA:  Pancreatic cancer, epigastric pain, leukocytosis, lactic acidosis  EXAM: CT ABDOMEN AND PELVIS WITH CONTRAST  TECHNIQUE: Multidetector CT imaging of the abdomen and pelvis was performed using the standard protocol following bolus administration of intravenous contrast.  CONTRAST:  164mL OMNIPAQUE IOHEXOL 300 MG/ML  SOLN  COMPARISON:  09/24/2014  FINDINGS: Lower chest: Small bilateral pleural effusions. Calcified granulomas in the right middle lobe.  Right chest power port terminates at the cavoatrial junction.  Hepatobiliary: Multifocal hepatic metastases throughout the liver. Dominant 6.5 x 4.9 cm lesion in the right hepatic lobe (series 3/image 17), previously 3.6 x 2.4 cm. Dominant 7.2 x 8.3 cm lesion in the left hepatic lobe (series 3/ image 33), previously 4.2 x 3.7 cm.  Gallbladder is unremarkable. No intrahepatic or extrahepatic ductal dilatation.  Pancreas: 8.2 x 9.9 cm mass in the distal pancreatic body/tail, corresponding to known pancreatic cancer, previously 6.2 x 8.7 cm.  Spleen: Calcified splenic granulomata.  Adrenals/Urinary Tract: Left adrenal gland abuts the pancreatic mass (Series 3/ image 26). Right adrenal  gland is within normal limits.  Anterior interpolar left kidney abuts the pancreatic mass and is likely involve (series 3/ image 29). Right kidney is within normal limits. No hydronephrosis.  Bladder is mildly thick-walled but underdistended.  Stomach/Bowel: Pancreatic mass abuts but does not  definitely involve the posterior gastric cardia, which is notable for associated submucosal edema (Series 2/ image 22).  No evidence of bowel obstruction.  Near concentric sigmoid colonic mass measuring 2.3 x 2.9 cm (series 3/image 71), worrisome for primary colonic neoplasm, less likely metastasis.  Vascular/Lymphatic: No evidence of abdominal aortic aneurysm. Retro aortic left renal vein.  9 mm short axis portacaval node (series 3/ image 32).  Reproductive: Calcified uterine fibroids.  No adnexal masses.  Other: Moderate abdominopelvic ascites.  Body wall edema.  Musculoskeletal: Mild degenerative changes of the visualized thoracolumbar spine.  IMPRESSION: 9.9 cm mass in the distal pancreatic body/tail, corresponding to known pancreatic cancer, increased.  Mass abuts and may involve the anterior left kidney. Mass abuts but does not definitely involve the posterior stomach and left adrenal gland.  Progression of multifocal hepatic metastases, as above.  2.9 cm sigmoid colonic mass, worrisome for primary colonic neoplasm, less likely metastasis.  Moderate abdominal pelvic ascites.  Body wall edema.  Additional ancillary findings as above.   Electronically Signed   By: Julian Hy M.D.   On: 12/16/2014 15:47   Dg Chest Port 1 View  12/15/2014   CLINICAL DATA:  Weakness, lethargy.  EXAM: PORTABLE CHEST - 1 VIEW  COMPARISON:  Chest radiograph 11/05/2014  FINDINGS: Right sided Port-A-Cath is present with tip projecting over the right atrium, unchanged. Stable cardiac and mediastinal contours. Elevation of the right hemidiaphragm. Minimal right basilar atelectasis. Re- demonstrated calcified granulomas. Radiodensity  projecting over the central right hemi thorax.  IMPRESSION: Radiodensity projecting over the central right hemi thorax nonspecific, potentially external to the patient. Recommend clinical correlation.  Elevation of the right hemidiaphragm with right basilar atelectasis.   Electronically Signed   By: Lovey Newcomer M.D.   On: 12/15/2014 15:10         Subjective: Patient continues to complain of epigastric pain. Denies any headache, fevers, chills, chest pain, shortness breath, vomiting, rashes, synovitis or should 2 loose stools yesterday.  Objective: Filed Vitals:   12/16/14 0605 12/16/14 1426 12/16/14 2222 12/17/14 0630  BP: 147/85 151/79 149/89 146/84  Pulse: 111 114 117 116  Temp: 99.1 F (37.3 C) 97.9 F (36.6 C) 98.6 F (37 C) 98.1 F (36.7 C)  TempSrc: Oral Oral Oral Oral  Resp: 18 18 18 18   Height:      Weight:      SpO2: 99% 100% 100% 100%    Intake/Output Summary (Last 24 hours) at 12/17/14 0841 Last data filed at 12/16/14 2225  Gross per 24 hour  Intake     50 ml  Output      0 ml  Net     50 ml   Weight change:  Exam:   General:  Pt is alert, follows commands appropriately, not in acute distress  HEENT: No icterus, No thrush,  [place]/AT  Cardiovascular: RRR, S1/S2, no rubs, no gallops  Respiratory: CTA bilaterally, no wheezing, no crackles, no rhonchi  Abdomen: Soft/+BS, epigastric pain without any rebound non distended, no guarding  Extremities: No edema, No lymphangitis, No petechiae, No rashes, no synovitis  Data Reviewed: Basic Metabolic Panel:  Recent Labs Lab 12/15/14 1339 12/16/14 0630 12/17/14 0445  NA 134* 138 134*  K 3.7 3.7 3.1*  CL 99 108 108  CO2 25 22 19   GLUCOSE 144* 114* 100*  BUN 22 21 19   CREATININE 0.54 0.56 0.53  CALCIUM 9.9 9.1 8.7  MG  --  1.5 1.5   Liver Function Tests:  Recent Labs Lab 12/15/14 1339 12/16/14 0630 12/17/14 0445  AST 20 17 18   ALT 10 8 8   ALKPHOS 233* 202* 215*  BILITOT 0.5 0.6 0.5  PROT 6.6  5.8* 5.5*  ALBUMIN 1.8* 1.6* 1.5*   No results for input(s): LIPASE, AMYLASE in the last 168 hours. No results for input(s): AMMONIA in the last 168 hours. CBC:  Recent Labs Lab 12/15/14 1339 12/16/14 0630 12/17/14 0445  WBC 19.5* 17.3* 20.7*  NEUTROABS 16.4* 14.5* 17.6*  HGB 9.2* 8.4* 8.4*  HCT 28.3* 26.2* 26.1*  MCV 86.5 87.3 88.2  PLT 373 334 334   Cardiac Enzymes:  Recent Labs Lab 12/15/14 1339  TROPONINI 0.23*   BNP: Invalid input(s): POCBNP CBG:  Recent Labs Lab 12/16/14 1616 12/16/14 2031 12/17/14 0038 12/17/14 0437 12/17/14 0817  GLUCAP 110* 92 110* 114* 95    Recent Results (from the past 240 hour(s))  Blood culture (routine x 2)     Status: None (Preliminary result)   Collection Time: 12/15/14  2:32 PM  Result Value Ref Range Status   Specimen Description BLOOD LEFT ARM  Final   Special Requests BOTTLES DRAWN AEROBIC ONLY 5CC  Final   Culture   Final           BLOOD CULTURE RECEIVED NO GROWTH TO DATE CULTURE WILL BE HELD FOR 5 DAYS BEFORE ISSUING A FINAL NEGATIVE REPORT Performed at [facility]    Report Status PENDING  Incomplete  Blood culture (routine x 2)     Status: None (Preliminary result)   Collection Time: 12/15/14  2:38 PM  Result Value Ref Range Status   Specimen Description BLOOD CENTRAL LINE  Final   Special Requests BOTTLES DRAWN AEROBIC AND ANAEROBIC 5CC  Final   Culture   Final           BLOOD CULTURE RECEIVED NO GROWTH TO DATE CULTURE WILL BE HELD FOR 5 DAYS BEFORE ISSUING A FINAL NEGATIVE REPORT Performed at [facility]    Report Status PENDING  Incomplete  Urine culture     Status: None   Collection Time: 12/15/14  3:39 PM  Result Value Ref Range Status   Specimen Description URINE, CATHETERIZED  Final   Special Requests NONE  Final   Colony Count NO GROWTH Performed at [facility]   Final   Culture NO GROWTH Performed at [facility]   Final   Report Status 12/17/2014 FINAL  Final       Scheduled Meds: . antiseptic oral rinse  7 mL Mouth Rinse q12n4p  . aspirin EC  81 mg Oral Daily  . aztreonam  1 g Intravenous 3 times per day  . chlorhexidine  15 mL Mouth Rinse BID  . enoxaparin (LOVENOX) injection  40 mg Subcutaneous Q24H  . feeding supplement (ENSURE COMPLETE)  237 mL Oral TID BM  . insulin aspart  0-9 Units Subcutaneous 6 times per day  . sodium chloride  10-40 mL Intracatheter Q12H  . vancomycin  1,000 mg Intravenous Q24H   Continuous Infusions: . sodium chloride 100 mL/hr at 12/17/14 0515     Laquanda Bick, DO  [facility] Pager [phone]  If 7PM-7AM, please contact night-coverage [email or web address] Password [email or web address] 12/17/2014, 8:41 AM   LOS: 2 days

## 2014-12-17 NOTE — Social Work (Signed)
[facility] social worker (SW) Lorriane Shire Royse visited pt and daughter [name], w/ [facility] RN Flo Shanks. Upon arrival, pt was laying on her side and responded briefly to SW, stating that her pain is "not too bad" and the Tylenol is helping. [name] reported dramatic changes in pt's condition since Wednesday and she seems to still be processing these recent changes. [name] expressed confusion re: the difference between palliative care and hospice care, and whether pt is eligible for palliative care. SW explained that [facility] only has palliative care in the nursing homes, and hospice is the option for home care. [name] stated that it is up to her mom whether she wants to pursue other treatment. A family member works at [employer] and pt has been keeping family updated about her condition. [name] would like direct communication from the doctors and hospice staff, addressing pt's options for treatment, expectations related to prognosis, risks involved, etc. After getting all the information, she and family will discuss w/ pt, who will make the decision. Please call [phone] with any questions. Please leave a VM with a contact phone number and this SW will return the call.  Thanks, Mikle Bosworth, LCSW

## 2014-12-18 DIAGNOSIS — M6281 Muscle weakness (generalized): Secondary | ICD-10-CM

## 2014-12-18 DIAGNOSIS — R110 Nausea: Secondary | ICD-10-CM

## 2014-12-18 DIAGNOSIS — D649 Anemia, unspecified: Secondary | ICD-10-CM

## 2014-12-18 DIAGNOSIS — G893 Neoplasm related pain (acute) (chronic): Secondary | ICD-10-CM

## 2014-12-18 DIAGNOSIS — D72829 Elevated white blood cell count, unspecified: Secondary | ICD-10-CM

## 2014-12-18 DIAGNOSIS — R1900 Intra-abdominal and pelvic swelling, mass and lump, unspecified site: Secondary | ICD-10-CM

## 2014-12-18 LAB — LACTIC ACID, PLASMA: LACTIC ACID, VENOUS: 3.2 mmol/L — AB (ref 0.5–2.2)

## 2014-12-18 LAB — COMPREHENSIVE METABOLIC PANEL
ALT: 9 U/L (ref 0–35)
ANION GAP: 10 (ref 5–15)
AST: 19 U/L (ref 0–37)
Albumin: 1.5 g/dL — ABNORMAL LOW (ref 3.5–5.2)
Alkaline Phosphatase: 208 U/L — ABNORMAL HIGH (ref 39–117)
BUN: 17 mg/dL (ref 6–23)
CO2: 17 mmol/L — ABNORMAL LOW (ref 19–32)
Calcium: 9 mg/dL (ref 8.4–10.5)
Chloride: 109 mEq/L (ref 96–112)
Creatinine, Ser: 0.46 mg/dL — ABNORMAL LOW (ref 0.50–1.10)
GFR calc Af Amer: >90 mL/min (ref >90)
GFR calc non Af Amer: >90 mL/min (ref >90)
Glucose, Bld: 100 mg/dL — ABNORMAL HIGH (ref 70–99)
Potassium: 3.2 mmol/L — ABNORMAL LOW (ref 3.5–5.1)
Sodium: 136 mmol/L (ref 135–145)
TOTAL PROTEIN: 5.9 g/dL — AB (ref 6.0–8.3)
Total Bilirubin: 0.8 mg/dL (ref 0.3–1.2)

## 2014-12-18 LAB — CEA: CEA: 2405 ng/mL — ABNORMAL HIGH (ref 0.0–4.7)

## 2014-12-18 LAB — CBC WITH DIFFERENTIAL/PLATELET
BASOS PCT: 0 % (ref 0–1)
Basophils Absolute: 0 10*3/uL (ref 0.0–0.1)
EOS PCT: 1 % (ref 0–5)
Eosinophils Absolute: 0.3 10*3/uL (ref 0.0–0.7)
HCT: 26.2 % — ABNORMAL LOW (ref 36.0–46.0)
HEMOGLOBIN: 8.6 g/dL — AB (ref 12.0–15.0)
LYMPHS ABS: 1 10*3/uL (ref 0.7–4.0)
Lymphocytes Relative: 4 % — ABNORMAL LOW (ref 12–46)
MCH: 28.5 pg (ref 26.0–34.0)
MCHC: 32.8 g/dL (ref 30.0–36.0)
MCV: 86.8 fL (ref 78.0–100.0)
Monocytes Absolute: 1.6 10*3/uL — ABNORMAL HIGH (ref 0.1–1.0)
Monocytes Relative: 7 % (ref 3–12)
NEUTROS PCT: 88 % — AB (ref 43–77)
Neutro Abs: 20.7 10*3/uL — ABNORMAL HIGH (ref 1.7–7.7)
Platelets: 334 10*3/uL (ref 150–400)
RBC: 3.02 MIL/uL — AB (ref 3.87–5.11)
RDW: 17.3 % — ABNORMAL HIGH (ref 11.5–15.5)
WBC: 23.7 10*3/uL — ABNORMAL HIGH (ref 4.0–10.5)

## 2014-12-18 LAB — MAGNESIUM: Magnesium: 1.5 mg/dL (ref 1.5–2.5)

## 2014-12-18 MED ORDER — SODIUM CHLORIDE 0.9 % IV SOLN
INTRAVENOUS | Status: DC
  Administered 2014-12-18: 22:00:00 via INTRAVENOUS
  Filled 2014-12-18 (×3): qty 1000

## 2014-12-18 MED ORDER — METOPROLOL TARTRATE 12.5 MG HALF TABLET
12.5000 mg | ORAL_TABLET | Freq: Two times a day (BID) | ORAL | Status: DC
  Administered 2014-12-18 – 2014-12-20 (×4): 12.5 mg via ORAL
  Filled 2014-12-18 (×5): qty 1

## 2014-12-18 MED ORDER — POTASSIUM CHLORIDE CRYS ER 20 MEQ PO TBCR
40.0000 meq | EXTENDED_RELEASE_TABLET | Freq: Once | ORAL | Status: AC
  Administered 2014-12-18: 40 meq via ORAL
  Filled 2014-12-18: qty 2

## 2014-12-18 NOTE — Progress Notes (Signed)
Inpatient RN visit- Nadean Montanaro [place] [facility] RN Visit-Karen Alford Highland RN  Related admission to [facility] diagnosis of Pancreatic Ca.  Pt is DNR code.    Patient seen at bedside, appeared to be sleeping, breakfast tray on over bed table, untouched. Patient easily awakened to voice. She denied pain, reports she had just had tylenol and that the tylenol does keep her comfortable. She did not want any of the food on her breakfast tray, did take some ensure. Po intake remains poor, 25% of only one meal since admission. She continues of IVF @ 133ml/hr and IV abt. Some edema noted to L hand at visit.  Per staff RN Strong patient now needs assist of 2 to get out of bed to the [facility]. No discharge plans at time of visit. Per Dr. Gearldine Shown progress note today, he plans on talking with patient's family regarding any remaining treatments available. No family present at time of visit.  [facility] will continue to follow. Patient's home medication list, transfer summary and OOF DNR is in place on shadow chart.  Please call [facility] @ [phone]-with any hospice needs.   Thank you. Tracey Harries, RN  [facility]  Hospice Liaison  [phone])

## 2014-12-18 NOTE — Progress Notes (Signed)
ANTIBIOTIC CONSULT NOTE - INITIAL  Pharmacy Consult for Aztreonam, Vancomycin Indication: sepsis  Allergies  Allergen Reactions  . Amoxicillin Rash    Patient Measurements: Height: 5\' 1"  (154.9 cm) Weight: 96 lb 15.8 oz (43.993 kg) IBW/kg (Calculated) : 47.8   Labs:  Recent Labs  12/16/14 0630 12/17/14 0445 12/18/14 0505  WBC 17.3* 20.7* 23.7*  HGB 8.4* 8.4* 8.6*  PLT 334 334 334  CREATININE 0.56 0.53 0.46*   Estimated Creatinine Clearance: 42.2 mL/min (by C-G formula based on Cr of 0.46). No results for input(s): VANCOTROUGH, VANCOPEAK, VANCORANDOM, GENTTROUGH, GENTPEAK, GENTRANDOM, TOBRATROUGH, TOBRAPEAK, TOBRARND, AMIKACINPEAK, AMIKACINTROU, AMIKACIN in the last 72 hours.   Microbiology: Recent Results (from the past 720 hour(s))  Blood culture (routine x 2)     Status: None (Preliminary result)   Collection Time: 12/15/14  2:32 PM  Result Value Ref Range Status   Specimen Description BLOOD LEFT ARM  Final   Special Requests BOTTLES DRAWN AEROBIC ONLY 5CC  Final   Culture   Final           BLOOD CULTURE RECEIVED NO GROWTH TO DATE CULTURE WILL BE HELD FOR 5 DAYS BEFORE ISSUING A FINAL NEGATIVE REPORT Performed at [facility]    Report Status PENDING  Incomplete  Blood culture (routine x 2)     Status: None (Preliminary result)   Collection Time: 12/15/14  2:38 PM  Result Value Ref Range Status   Specimen Description BLOOD CENTRAL LINE  Final   Special Requests BOTTLES DRAWN AEROBIC AND ANAEROBIC 5CC  Final   Culture   Final           BLOOD CULTURE RECEIVED NO GROWTH TO DATE CULTURE WILL BE HELD FOR 5 DAYS BEFORE ISSUING A FINAL NEGATIVE REPORT Performed at [facility]    Report Status PENDING  Incomplete  Urine culture     Status: None   Collection Time: 12/15/14  3:39 PM  Result Value Ref Range Status   Specimen Description URINE, CATHETERIZED  Final   Special Requests NONE  Final   Colony Count NO GROWTH Performed at [facility]   Final   Culture NO GROWTH Performed at [facility]   Final   Report Status 12/17/2014 FINAL  Final  Clostridium Difficile by PCR     Status: None   Collection Time: 12/16/14  7:06 PM  Result Value Ref Range Status   C difficile by pcr NEGATIVE NEGATIVE Final    Medical History: Past Medical History  Diagnosis Date  . Type II diabetes mellitus     "just dx'd today' (12/08/2013)  . Hypertension   . Acute renal failure   . DKA (diabetic ketoacidoses)   . Pancreatic cancer 2015    Assessment: 74 year old female with PMH history including pancreatic cancer, HTN, and Type II DM admitted with increasing weakness and lethargy.  She is on D#4  Vancomycin and Aztreonam for sepsis.she is afebrile, wbc 23.7, trending up. Scr 0.46, stable, est. crcl ~ 40 ml/min. Current antibiotics dosing appropriate. Likely home hospice soon.  Goal of Therapy:  Vancomycin trough level 15-20 mcg/ml   Plan:  Continue Aztreonam 1 gram iv Q 8 hours Continue Vancomycin 1 gram iv Q 24 hours Check vancomycin trough Thursday or Friday if continues > 48 hrs.   Maryanna Shape, PharmD, BCPS  Clinical Pharmacist  Pager: [phone]  12/18/2014,2:27 PM

## 2014-12-18 NOTE — Progress Notes (Addendum)
PROGRESS NOTE  Debra [name] HBZ:[id number] DOB: 1941-11-03 DOA: 12/15/2014 PCP: Rodena Medin, MD   Brief history 74 year old female with a history of metastatic pancreatic cancer to the liver, hypertension, diabetes mellitus type 2 who is admitted with 3 days of generalized weakness, fatigue, and decreased oral intake. The patient was recently discharged from the hospital on 11/08/2014 with similar presentation after mechanical fall. At that time, the decision was made to discharge the patient home with hospice care. The patient denies fevers, chills, chest pain, shortness of breath, vomiting, diarrhea, dysuria. She has some epigastric discomfort, but this has improved through the hospitalization.  At the time of admission, the patient was started to have a CBC 19.5, lactic acid 4.06, and tachycardia into the mid 120s. The patient was started on empiric vancomycin, aztreonam, and levofloxacin.  Levofloxacin was stopped after one dose.   Assessment/Plan: Leukocytosis/tachycardia/lactic acidosis -Patient has received over 3 days of broad-spectrum antibiotics, vancomycin and aztreonam -Patient remains afebrile and hemodynamically stable -Discontinue all antibiotics and observe -Follow up blood cultures--neg to date -Urinalysis did not show any pyuria  -Chest x-ray negative for consolidations or pulmonary edema  -IV fluids  -given pt's abdominal pain and current presentation-->CT abdomen and pelvis -12/15/2014 CT abdomen pelvis--shows progression of pancreatic cancer and liver metastasis. There is also a new 2.9 cm sigmoid mass -leukocytosis may be reactive and a reflection of pt's metastatic cancer with bone marrow reactive response Hypercalcemia -Corrected calcium 11.6 on the day of admission -Multifactorial including dehydration, immobility, and possibly malignancy -Improving with IV fluids -12/17/2014--corrected calcium 11.0 Generalized weakness -Multifactorial including  deconditioning, infection, dehydration, anemia, metastatic pancreatic cancer -A.m. Cortisol--28.7 -TSH--2.819 -Serum B12--1595 Severe protein calorie malnutrition  -Nutrition consult  -Nutrition supplementation  Sinus tachycardia  -Patient without any chest pain, shortness of breath; oxygen saturation 100% on room air -Multifactorial including dehydration, and anemia, infectious process, -This has been a chronic problem from prior hospitalizations  -Patient without any chest discomfort or shortness of breath  -TSH--2.819 -start low dose metoprolol Lower extremity edema -likely due to low albumin, IVF, immobility -venous duplex Diabetes mellitus type 2 -09/26/2014 hemoglobin A1c 8.4 -Given the patient's age and present medical conditions, liberalize diet -novolog sliding scale short term -d/c levemir for now -CBGs controlled -Given the patient's present clinical condition, discontinue all CBG checks Metastatic pancreatic adenocarcinoma -CT scan abdomen and pelvis shows progression of pancreatic adenocarcinoma as well as new sigmoid colon mass -Case was discussed with the patient's oncologist Dr. Benay Spice he agreed with discussion with family toward a focus of comfort care -Patient is favoring no further chemotherapy, radiation, surgery, waiting for rest of family concensus   Communication--spoke with daughter [name] on phone 12/17/14, left message 12/18/14 Dispo--family considering comfort care--home with hospice 12/18/14     Procedures/Studies: Ct Abdomen Pelvis W Contrast  12/16/2014   CLINICAL DATA:  Pancreatic cancer, epigastric pain, leukocytosis, lactic acidosis  EXAM: CT ABDOMEN AND PELVIS WITH CONTRAST  TECHNIQUE: Multidetector CT imaging of the abdomen and pelvis was performed using the standard protocol following bolus administration of intravenous contrast.  CONTRAST:  125mL OMNIPAQUE IOHEXOL 300 MG/ML  SOLN  COMPARISON:  09/24/2014  FINDINGS: Lower chest:  Small bilateral pleural effusions. Calcified granulomas in the right middle lobe.  Right chest power port terminates at the cavoatrial junction.  Hepatobiliary: Multifocal hepatic metastases throughout the liver. Dominant 6.5 x 4.9 cm lesion in the right hepatic lobe (series 3/image 17), previously 3.6 x 2.4 cm.  Dominant 7.2 x 8.3 cm lesion in the left hepatic lobe (series 3/ image 33), previously 4.2 x 3.7 cm.  Gallbladder is unremarkable. No intrahepatic or extrahepatic ductal dilatation.  Pancreas: 8.2 x 9.9 cm mass in the distal pancreatic body/tail, corresponding to known pancreatic cancer, previously 6.2 x 8.7 cm.  Spleen: Calcified splenic granulomata.  Adrenals/Urinary Tract: Left adrenal gland abuts the pancreatic mass (Series 3/ image 26). Right adrenal gland is within normal limits.  Anterior interpolar left kidney abuts the pancreatic mass and is likely involve (series 3/ image 29). Right kidney is within normal limits. No hydronephrosis.  Bladder is mildly thick-walled but underdistended.  Stomach/Bowel: Pancreatic mass abuts but does not definitely involve the posterior gastric cardia, which is notable for associated submucosal edema (Series 2/ image 22).  No evidence of bowel obstruction.  Near concentric sigmoid colonic mass measuring 2.3 x 2.9 cm (series 3/image 71), worrisome for primary colonic neoplasm, less likely metastasis.  Vascular/Lymphatic: No evidence of abdominal aortic aneurysm. Retro aortic left renal vein.  9 mm short axis portacaval node (series 3/ image 32).  Reproductive: Calcified uterine fibroids.  No adnexal masses.  Other: Moderate abdominopelvic ascites.  Body wall edema.  Musculoskeletal: Mild degenerative changes of the visualized thoracolumbar spine.  IMPRESSION: 9.9 cm mass in the distal pancreatic body/tail, corresponding to known pancreatic cancer, increased.  Mass abuts and may involve the anterior left kidney. Mass abuts but does not definitely involve the posterior  stomach and left adrenal gland.  Progression of multifocal hepatic metastases, as above.  2.9 cm sigmoid colonic mass, worrisome for primary colonic neoplasm, less likely metastasis.  Moderate abdominal pelvic ascites.  Body wall edema.  Additional ancillary findings as above.   Electronically Signed   By: Julian Hy M.D.   On: 12/16/2014 15:47   Dg Chest Port 1 View  12/15/2014   CLINICAL DATA:  Weakness, lethargy.  EXAM: PORTABLE CHEST - 1 VIEW  COMPARISON:  Chest radiograph 11/05/2014  FINDINGS: Right sided Port-A-Cath is present with tip projecting over the right atrium, unchanged. Stable cardiac and mediastinal contours. Elevation of the right hemidiaphragm. Minimal right basilar atelectasis. Re- demonstrated calcified granulomas. Radiodensity projecting over the central right hemi thorax.  IMPRESSION: Radiodensity projecting over the central right hemi thorax nonspecific, potentially external to the patient. Recommend clinical correlation.  Elevation of the right hemidiaphragm with right basilar atelectasis.   Electronically Signed   By: Lovey Newcomer M.D.   On: 12/15/2014 15:10         Subjective: Patient is feeling a little better. She denies any fevers, chills, chest pain, shortness breath, nausea, vomiting, diarrhea, abdominal pain.  Objective: Filed Vitals:   12/17/14 1400 12/17/14 2141 12/18/14 0601 12/18/14 1430  BP: 149/80 141/94 149/82 143/86  Pulse: 115 120 122 117  Temp: 98.6 F (37 C) 97.7 F (36.5 C) 97.7 F (36.5 C) 98.2 F (36.8 C)  TempSrc: Oral Oral Oral Oral  Resp: 16 21 18 20   Height:      Weight:      SpO2: 100% 100% 100% 99%    Intake/Output Summary (Last 24 hours) at 12/18/14 1841 Last data filed at 12/18/14 1062  Gross per 24 hour  Intake   6550 ml  Output      0 ml  Net   6550 ml   Weight change:  Exam:   General:  Pt is alert, follows commands appropriately, not in acute distress  HEENT: No icterus, No thrush,[place]/AT  Cardiovascular: RRR,  S1/S2, no rubs, no gallops  Respiratory: CTA bilaterally, no wheezing, no crackles, no rhonchi  Abdomen: Soft/+BS, non tender, non distended, no guarding  Extremities: 2+LE edema, No lymphangitis, No petechiae, No rashes, no synovitis  Data Reviewed: Basic Metabolic Panel:  Recent Labs Lab 12/15/14 1339 12/16/14 0630 12/17/14 0445 12/18/14 0505  NA 134* 138 134* 136  K 3.7 3.7 3.1* 3.2*  CL 99 108 108 109  CO2 25 22 19  17*  GLUCOSE 144* 114* 100* 100*  BUN 22 21 19 17   CREATININE 0.54 0.56 0.53 0.46*  CALCIUM 9.9 9.1 8.7 9.0  MG  --  1.5 1.5 1.5   Liver Function Tests:  Recent Labs Lab 12/15/14 1339 12/16/14 0630 12/17/14 0445 12/18/14 0505  AST 20 17 18 19   ALT 10 8 8 9   ALKPHOS 233* 202* 215* 208*  BILITOT 0.5 0.6 0.5 0.8  PROT 6.6 5.8* 5.5* 5.9*  ALBUMIN 1.8* 1.6* 1.5* 1.5*   No results for input(s): LIPASE, AMYLASE in the last 168 hours. No results for input(s): AMMONIA in the last 168 hours. CBC:  Recent Labs Lab 12/15/14 1339 12/16/14 0630 12/17/14 0445 12/18/14 0505  WBC 19.5* 17.3* 20.7* 23.7*  NEUTROABS 16.4* 14.5* 17.6* 20.7*  HGB 9.2* 8.4* 8.4* 8.6*  HCT 28.3* 26.2* 26.1* 26.2*  MCV 86.5 87.3 88.2 86.8  PLT 373 334 334 334   Cardiac Enzymes:  Recent Labs Lab 12/15/14 1339  TROPONINI 0.23*   BNP: Invalid input(s): POCBNP CBG:  Recent Labs Lab 12/16/14 2031 12/17/14 0038 12/17/14 0437 12/17/14 0817 12/17/14 1612  GLUCAP 92 110* 114* 95 143*    Recent Results (from the past 240 hour(s))  Blood culture (routine x 2)     Status: None (Preliminary result)   Collection Time: 12/15/14  2:32 PM  Result Value Ref Range Status   Specimen Description BLOOD LEFT ARM  Final   Special Requests BOTTLES DRAWN AEROBIC ONLY 5CC  Final   Culture   Final           BLOOD CULTURE RECEIVED NO GROWTH TO DATE CULTURE WILL BE HELD FOR 5 DAYS BEFORE ISSUING A FINAL NEGATIVE REPORT Performed at [facility]    Report Status PENDING   Incomplete  Blood culture (routine x 2)     Status: None (Preliminary result)   Collection Time: 12/15/14  2:38 PM  Result Value Ref Range Status   Specimen Description BLOOD CENTRAL LINE  Final   Special Requests BOTTLES DRAWN AEROBIC AND ANAEROBIC 5CC  Final   Culture   Final           BLOOD CULTURE RECEIVED NO GROWTH TO DATE CULTURE WILL BE HELD FOR 5 DAYS BEFORE ISSUING A FINAL NEGATIVE REPORT Performed at [facility]    Report Status PENDING  Incomplete  Urine culture     Status: None   Collection Time: 12/15/14  3:39 PM  Result Value Ref Range Status   Specimen Description URINE, CATHETERIZED  Final   Special Requests NONE  Final   Colony Count NO GROWTH Performed at [facility]   Final   Culture NO GROWTH Performed at [facility]   Final   Report Status 12/17/2014 FINAL  Final  Clostridium Difficile by PCR     Status: None   Collection Time: 12/16/14  7:06 PM  Result Value Ref Range Status   C difficile by pcr NEGATIVE NEGATIVE Final     Scheduled Meds: . antiseptic oral rinse  7 mL Mouth Rinse q12n4p  . aspirin EC  81 mg Oral Daily  . aztreonam  1 g Intravenous 3 times per day  . chlorhexidine  15 mL Mouth Rinse BID  . enoxaparin (LOVENOX) injection  40 mg Subcutaneous Q24H  . feeding supplement (ENSURE COMPLETE)  237 mL Oral TID BM  . sodium chloride  10-40 mL Intracatheter Q12H  . vancomycin  1,000 mg Intravenous Q24H   Continuous Infusions: . sodium chloride 100 mL/hr at 12/18/14 1800     Lucianna Ostlund, DO  [facility] Pager [phone]  If 7PM-7AM, please contact night-coverage [email or web address] Password [facility] 12/18/2014, 6:41 PM   LOS: 3 days

## 2014-12-18 NOTE — Progress Notes (Signed)
Debra [name] OFFICE PROGRESS NOTE   Diagnosis: Pancreas cancer  INTERVAL HISTORY:   Debra [name] is known to me with a history of pancreas cancer. She was treated with 1 dose of gemcitabine. She was admitted last month following a fall. A decision was made to discontinue chemotherapy. She is now enrolled in the [facility] hospice program. She was admitted 12/15/2014 with generalized weakness. A CT of the abdomen and pelvis on 12/16/2014 revealed progression of the pancreas mass and liver metastases compared to a CT from October 2015. A mass was noted in the sigmoid colon concerning for a primary colonic neoplasm.  Debra [name] denies pain, nausea, bleeding, and difficulty with bowel function.   Objective:  Vital signs in last 24 hours:  Blood pressure 149/82, pulse 122, temperature 97.7 F (36.5 C), temperature source Oral, resp. rate 18, height 5\' 1"  (1.549 m), weight 96 lb 15.8 oz (43.993 kg), SpO2 100 %.    Resp: Lungs clear anteriorly Cardio: Regular rate and rhythm GI: The liver is palpable in the right upper abdomen Vascular: Trace low leg/ankle edema bilaterally Neuro: Alert, follows commands, not oriented to month    Portacath/PICC-without erythema  Lab Results:  Lab Results  Component Value Date   WBC 23.7* 12/18/2014   HGB 8.6* 12/18/2014   HCT 26.2* 12/18/2014   MCV 86.8 12/18/2014   PLT 334 12/18/2014   NEUTROABS 20.7* 12/18/2014     Lab Results  Component Value Date   CEA 2405.0* 12/17/2014    Imaging:  Ct Abdomen Pelvis W Contrast  12/16/2014   CLINICAL DATA:  Pancreatic cancer, epigastric pain, leukocytosis, lactic acidosis  EXAM: CT ABDOMEN AND PELVIS WITH CONTRAST  TECHNIQUE: Multidetector CT imaging of the abdomen and pelvis was performed using the standard protocol following bolus administration of intravenous contrast.  CONTRAST:  14mL OMNIPAQUE IOHEXOL 300 MG/ML  SOLN  COMPARISON:  09/24/2014  FINDINGS: Lower chest: Small bilateral  pleural effusions. Calcified granulomas in the right middle lobe.  Right chest power port terminates at the cavoatrial junction.  Hepatobiliary: Multifocal hepatic metastases throughout the liver. Dominant 6.5 x 4.9 cm lesion in the right hepatic lobe (series 3/image 17), previously 3.6 x 2.4 cm. Dominant 7.2 x 8.3 cm lesion in the left hepatic lobe (series 3/ image 33), previously 4.2 x 3.7 cm.  Gallbladder is unremarkable. No intrahepatic or extrahepatic ductal dilatation.  Pancreas: 8.2 x 9.9 cm mass in the distal pancreatic body/tail, corresponding to known pancreatic cancer, previously 6.2 x 8.7 cm.  Spleen: Calcified splenic granulomata.  Adrenals/Urinary Tract: Left adrenal gland abuts the pancreatic mass (Series 3/ image 26). Right adrenal gland is within normal limits.  Anterior interpolar left kidney abuts the pancreatic mass and is likely involve (series 3/ image 29). Right kidney is within normal limits. No hydronephrosis.  Bladder is mildly thick-walled but underdistended.  Stomach/Bowel: Pancreatic mass abuts but does not definitely involve the posterior gastric cardia, which is notable for associated submucosal edema (Series 2/ image 22).  No evidence of bowel obstruction.  Near concentric sigmoid colonic mass measuring 2.3 x 2.9 cm (series 3/image 71), worrisome for primary colonic neoplasm, less likely metastasis.  Vascular/Lymphatic: No evidence of abdominal aortic aneurysm. Retro aortic left renal vein.  9 mm short axis portacaval node (series 3/ image 32).  Reproductive: Calcified uterine fibroids.  No adnexal masses.  Other: Moderate abdominopelvic ascites.  Body wall edema.  Musculoskeletal: Mild degenerative changes of the visualized thoracolumbar spine.  IMPRESSION: 9.9 cm mass in the  distal pancreatic body/tail, corresponding to known pancreatic cancer, increased.  Mass abuts and may involve the anterior left kidney. Mass abuts but does not definitely involve the posterior stomach and left  adrenal gland.  Progression of multifocal hepatic metastases, as above.  2.9 cm sigmoid colonic mass, worrisome for primary colonic neoplasm, less likely metastasis.  Moderate abdominal pelvic ascites.  Body wall edema.  Additional ancillary findings as above.   Electronically Signed   By: Julian Hy M.D.   On: 12/16/2014 15:47   Dg Chest Port 1 View  12/15/2014   CLINICAL DATA:  Weakness, lethargy.  EXAM: PORTABLE CHEST - 1 VIEW  COMPARISON:  Chest radiograph 11/05/2014  FINDINGS: Right sided Port-A-Cath is present with tip projecting over the right atrium, unchanged. Stable cardiac and mediastinal contours. Elevation of the right hemidiaphragm. Minimal right basilar atelectasis. Re- demonstrated calcified granulomas. Radiodensity projecting over the central right hemi thorax.  IMPRESSION: Radiodensity projecting over the central right hemi thorax nonspecific, potentially external to the patient. Recommend clinical correlation.  Elevation of the right hemidiaphragm with right basilar atelectasis.   Electronically Signed   By: Lovey Newcomer M.D.   On: 12/15/2014 15:10    Medications: I have reviewed the patient's current medications.  Assessment/Plan: 1. 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 Pain secondary to #1 3. Nausea secondary to #1 4. Diabetes 5. Anorexia/weight loss 6. Port-A-Cath placement 10/16/2014 7. CT 12/16/2014 with evidence of a sigmoid colon mass 8. Anemia secondary to chronic disease and probable GI bleeding  Disposition:  Debra [name] has a history of pancreas cancer. Her performance status continues to decline. A CT on 12/16/2014 reveals an increase in the pancreas mass and liver metastases. She now appears to have a sigmoid colon mass. The sigmoid lesion could represent a primary tumor or a metastasis.  The working diagnosis has  been metastatic pancreas cancer, but it is possible Debra [name] has metastatic colon cancer involving the liver and/or pancreas.  She appears slightly confused this morning. I will discuss the case with her daughter. My initial impression is to recommend comfort care as her performance status does not appear adequate to receive further chemotherapy. We can consider a colonoscopy and liver biopsy for further diagnostic evaluation if she is able to receive further systemic therapy.  Recommendations: 1. Continue comfort care measures with the plan for [facility] hospice care at discharge 2. I will discuss the case with her family regarding the indication for further diagnostic evaluation and treatment of the metastatic carcinoma.  [name], MD  12/18/2014  8:39 AM

## 2014-12-19 DIAGNOSIS — R6510 Systemic inflammatory response syndrome (SIRS) of non-infectious origin without acute organ dysfunction: Secondary | ICD-10-CM | POA: Insufficient documentation

## 2014-12-19 DIAGNOSIS — M79609 Pain in unspecified limb: Secondary | ICD-10-CM

## 2014-12-19 DIAGNOSIS — R609 Edema, unspecified: Secondary | ICD-10-CM

## 2014-12-19 LAB — COMPREHENSIVE METABOLIC PANEL
ALT: 9 U/L (ref 0–35)
AST: 17 U/L (ref 0–37)
Albumin: 1.4 g/dL — ABNORMAL LOW (ref 3.5–5.2)
Alkaline Phosphatase: 201 U/L — ABNORMAL HIGH (ref 39–117)
Anion gap: 7 (ref 5–15)
BUN: 20 mg/dL (ref 6–23)
CO2: 17 mmol/L — AB (ref 19–32)
Calcium: 8.8 mg/dL (ref 8.4–10.5)
Chloride: 109 mEq/L (ref 96–112)
Creatinine, Ser: 0.6 mg/dL (ref 0.50–1.10)
GFR calc Af Amer: >90 mL/min (ref >90)
GFR calc non Af Amer: 87 mL/min — ABNORMAL LOW (ref >90)
Glucose, Bld: 89 mg/dL (ref 70–99)
Potassium: 3.8 mmol/L (ref 3.5–5.1)
SODIUM: 133 mmol/L — AB (ref 135–145)
TOTAL PROTEIN: 5.6 g/dL — AB (ref 6.0–8.3)
Total Bilirubin: 0.8 mg/dL (ref 0.3–1.2)

## 2014-12-19 LAB — CBC WITH DIFFERENTIAL/PLATELET
BASOS PCT: 0 % (ref 0–1)
Basophils Absolute: 0 10*3/uL (ref 0.0–0.1)
Eosinophils Absolute: 0.4 10*3/uL (ref 0.0–0.7)
Eosinophils Relative: 2 % (ref 0–5)
HCT: 26.9 % — ABNORMAL LOW (ref 36.0–46.0)
HEMOGLOBIN: 8.9 g/dL — AB (ref 12.0–15.0)
LYMPHS PCT: 5 % — AB (ref 12–46)
Lymphs Abs: 1.2 10*3/uL (ref 0.7–4.0)
MCH: 28.6 pg (ref 26.0–34.0)
MCHC: 33.1 g/dL (ref 30.0–36.0)
MCV: 86.5 fL (ref 78.0–100.0)
MONOS PCT: 6 % (ref 3–12)
Monocytes Absolute: 1.3 10*3/uL — ABNORMAL HIGH (ref 0.1–1.0)
NEUTROS ABS: 20.3 10*3/uL — AB (ref 1.7–7.7)
NEUTROS PCT: 87 % — AB (ref 43–77)
Platelets: 373 10*3/uL (ref 150–400)
RBC: 3.11 MIL/uL — AB (ref 3.87–5.11)
RDW: 17.5 % — ABNORMAL HIGH (ref 11.5–15.5)
WBC: 23.1 10*3/uL — ABNORMAL HIGH (ref 4.0–10.5)

## 2014-12-19 LAB — LACTIC ACID, PLASMA: Lactic Acid, Venous: 3.3 mmol/L — ABNORMAL HIGH (ref 0.5–2.2)

## 2014-12-19 LAB — MAGNESIUM: MAGNESIUM: 1.5 mg/dL (ref 1.5–2.5)

## 2014-12-19 MED ORDER — LORAZEPAM 0.5 MG PO TABS: 0.5000 mg | ORAL_TABLET | ORAL | Status: DC | PRN

## 2014-12-19 MED ORDER — LORAZEPAM 0.5 MG PO TABS: 0.5000 mg | ORAL_TABLET | Freq: Four times a day (QID) | ORAL | Status: DC | PRN

## 2014-12-19 MED ORDER — LORAZEPAM 2 MG/ML PO CONC: 0.5000 mg | Freq: Four times a day (QID) | ORAL | Status: DC | PRN

## 2014-12-19 MED ORDER — MORPHINE SULFATE (CONCENTRATE) 10 MG/0.5ML PO SOLN: 5.0000 mg | ORAL | Status: DC | PRN

## 2014-12-19 NOTE — Progress Notes (Signed)
Inpatient RN visit- Brittish [facility] [place] [facility] RN Visit-Karen Alford [place] RN  Related admission to [facility] diagnosis of Pancreatic Ca.  Pt is DNR code.    Patient seen at bedside, appeared to be sleeping soundly, [facility] Chaplain in prior to RN visit. Reported to RN that patient stated she "was having a good day". Lunch tray present on over bed table untouched. Patient continues with just sips,. HR remains elevated. She is sleeping more and needing more assistance, she required 3 doses of tylenol for pain yesterday. Per conversation with staff RN Thelma patient has been sleeping most of the day. She required max assist of 2 to pivot to the [facility] this morning. Doppler of lower extremities done in response to patient's report of pain earlier, no DVT found. Attending physician note reports that family has decided on comfort care, no further labs or abt, IVF have been d/cd, family to decide on home vs possible residential hospice placement.  [facility] LCSW Lorriane Shire advised, Probation officer also spoke with [facility] regarding need for family decision on discharge plan. [facility] will continue to follow.  Patient's home medication list, transfer summary and OOF DNR  in place on shadow chart.  Please call [facility] @ [phone]-  with any hospice needs.   Thank you. Flo Shanks, RN, BSN,  [place] Liaison  [phone])

## 2014-12-19 NOTE — Progress Notes (Signed)
12/19/2013 1500 NCM spoke to dtr, [employer] via phone. NCM discussed plan to dc home or Residential Hospice. Dtr states she will discuss with family and make a decision about dc to home or residential hospice. CSW referral for possible dc to residential hospice. CSW will discuss with family placement choice. Jonnie Finner RN [facility] Case Mgmt phone [phone]

## 2014-12-19 NOTE — Progress Notes (Signed)
PATIENT DETAILS Name: Debra [name] Age: 74 y.o. Sex: female Date of Birth: 09-Mar-1941 Admit Date: 12/15/2014 Admitting Physician Allie Bossier, MD PCP:Cho, Gwyndolyn Saxon, MD  Subjective: No major events overnight-still very weak and has no appetite  Assessment/Plan: Active Problems:   SIR'S:likely infectious etiology-however blood cultures, C. difficile PCR negative. Chest x-ray negative for pneumonia, UA negative for UTI. Initially on 3 days of empiric vancomycin and aztreonam which is now been discontinued. Patient now transitioned to comfort measures, plan is for either home hospice or residential hospice on discharge.  Hypercalcemia: Likely secondary to malignancy, dehydration. Given decision to transition to comfort issues, no further lab work will be ordered.  Metastatic pancreatic adenocarcinoma with new sigmoid colon mass: Given significant decline in functional status, not a candidate for any further therapy. Family agreeable to transition to full comfort status. I spoke with patient's daughter-Alison-over the phone on 1/13 and confirmed comfort care plan.  Failure to thrive syndrome: Secondary to underlying malignancy. Supportive care.  Ethics/palliative care: DO NOT RESUSCITATE. Family aware that oncology is recommending against any further therapy at this point. Is already established with home hospice, family members in discussion with each other to see if they can provide care at home, otherwise may need residential hospice .Have consulted case management for needful. Have transitioned patient to full comfort care status, family aware that we will not be starting antibiotics, doing any further blood work.  Disposition: Remain inpatient-home vs residential hospice on discharge  Antibiotics:  See below   Anti-infectives    Start     Dose/Rate Route Frequency Ordered Stop   12/17/14 1430  levofloxacin (LEVAQUIN) IVPB 750 mg  Status:  Discontinued     750 mg100 mL/hr  over 90 Minutes Intravenous Every 48 hours 12/15/14 1526 12/16/14 0910   12/16/14 1500  vancomycin (VANCOCIN) IVPB 1000 mg/200 mL premix  Status:  Discontinued     1,000 mg200 mL/hr over 60 Minutes Intravenous Every 24 hours 12/15/14 1526 12/18/14 1856   12/15/14 2200  aztreonam (AZACTAM) 1 g in dextrose 5 % 50 mL IVPB  Status:  Discontinued     1 g100 mL/hr over 30 Minutes Intravenous 3 times per day 12/15/14 1526 12/18/14 1856   12/15/14 1430  levofloxacin (LEVAQUIN) IVPB 750 mg     750 mg100 mL/hr over 90 Minutes Intravenous  Once 12/15/14 1417 12/15/14 1701   12/15/14 1430  aztreonam (AZACTAM) 2 g in dextrose 5 % 50 mL IVPB     2 g100 mL/hr over 30 Minutes Intravenous  Once 12/15/14 1417 12/15/14 1609   12/15/14 1430  vancomycin (VANCOCIN) IVPB 1000 mg/200 mL premix     1,000 mg200 mL/hr over 60 Minutes Intravenous  Once 12/15/14 1417 12/15/14 1717      DVT Prophylaxis: Not needed-comfort care  Code Status:  DNR  Family Communication [name] Statin- [phone] on 1/13  Procedures:  None  CONSULTS:  hematology/oncology  MEDICATIONS: Scheduled Meds: . antiseptic oral rinse  7 mL Mouth Rinse q12n4p  . aspirin EC  81 mg Oral Daily  . chlorhexidine  15 mL Mouth Rinse BID  . enoxaparin (LOVENOX) injection  40 mg Subcutaneous Q24H  . feeding supplement (ENSURE COMPLETE)  237 mL Oral TID BM  . metoprolol tartrate  12.5 mg Oral BID  . sodium chloride  10-40 mL Intracatheter Q12H   Continuous Infusions: . sodium chloride 0.9 % 1,000 mL with potassium chloride 20 mEq infusion 75 mL/hr at 12/18/14 2149  PRN Meds:.acetaminophen **OR** acetaminophen, morphine injection, ondansetron, sodium chloride    PHYSICAL EXAM: Vital signs in last 24 hours: Filed Vitals:   12/18/14 2149 12/18/14 2154 12/19/14 0629 12/19/14 1331  BP: 155/84 155/84 134/72 148/86  Pulse: 121 121 115 123  Temp:  98.5 F (36.9 C) 98.3 F (36.8 C) 99.7 F (37.6 C)  TempSrc:  Oral Oral Oral  Resp:  20  28 30   Height:      Weight:      SpO2:  100% 99% 100%    Weight change:  Filed Weights   12/15/14 1308 12/15/14 1732  Weight: 43.092 kg (95 lb) 43.993 kg (96 lb 15.8 oz)   Body mass index is 18.33 kg/(m^2).   Gen Exam: Awake and alert with clear speech.  Chronically ill appearing Neck: Supple, No JVD.   Chest: B/L Clear.   CVS: S1 S2 Regular,Tachycardic Abdomen: soft, BS +, non distended.  Extremities: + edema, lower extremities warm to touch. Neurologic: Non Focal.   Skin: No Rash.   Wounds: N/A.   Intake/Output from previous day:  Intake/Output Summary (Last 24 hours) at 12/19/14 1408 Last data filed at 12/19/14 1025  Gross per 24 hour  Intake 2375.42 ml  Output      0 ml  Net 2375.42 ml     LAB RESULTS: CBC  Recent Labs Lab 12/15/14 1339 12/16/14 0630 12/17/14 0445 12/18/14 0505 12/19/14 0550  WBC 19.5* 17.3* 20.7* 23.7* 23.1*  HGB 9.2* 8.4* 8.4* 8.6* 8.9*  HCT 28.3* 26.2* 26.1* 26.2* 26.9*  PLT 373 334 334 334 373  MCV 86.5 87.3 88.2 86.8 86.5  MCH 28.1 28.0 28.4 28.5 28.6  MCHC 32.5 32.1 32.2 32.8 33.1  RDW 16.5* 17.0* 17.2* 17.3* 17.5*  LYMPHSABS 1.5 1.3 1.2 1.0 1.2  MONOABS 1.3* 1.2* 1.3* 1.6* 1.3*  EOSABS 0.3 0.3 0.5 0.3 0.4  BASOSABS 0.0 0.0 0.0 0.0 0.0    Chemistries   Recent Labs Lab 12/15/14 1339 12/16/14 0630 12/17/14 0445 12/18/14 0505 12/19/14 0550  NA 134* 138 134* 136 133*  K 3.7 3.7 3.1* 3.2* 3.8  CL 99 108 108 109 109  CO2 25 22 19  17* 17*  GLUCOSE 144* 114* 100* 100* 89  BUN 22 21 19 17 20   CREATININE 0.54 0.56 0.53 0.46* 0.60  CALCIUM 9.9 9.1 8.7 9.0 8.8  MG  --  1.5 1.5 1.5 1.5    CBG:  Recent Labs Lab 12/16/14 2031 12/17/14 0038 12/17/14 0437 12/17/14 0817 12/17/14 1612  GLUCAP 92 110* 114* 95 143*    GFR Estimated Creatinine Clearance: 42.2 mL/min (by C-G formula based on Cr of 0.6).  Coagulation profile No results for input(s): INR, PROTIME in the last 168 hours.  Cardiac Enzymes  Recent  Labs Lab 12/15/14 1339  TROPONINI 0.23*    Invalid input(s): POCBNP No results for input(s): DDIMER in the last 72 hours.  Recent Labs  12/17/14 0445  HGBA1C 5.7*   No results for input(s): CHOL, HDL, LDLCALC, TRIG, CHOLHDL, LDLDIRECT in the last 72 hours.  Recent Labs  12/17/14 0445  TSH 2.819    Recent Labs  12/17/14 0445  VITAMINB12 1595*   No results for input(s): LIPASE, AMYLASE in the last 72 hours.  Urine Studies No results for input(s): UHGB, CRYS in the last 72 hours.  Invalid input(s): UACOL, UAPR, USPG, UPH, UTP, UGL, UKET, UBIL, UNIT, UROB, ULEU, UEPI, UWBC, URBC, UBAC, CAST, UCOM, BILUA  MICROBIOLOGY: Recent Results (from the past 240 hour(s))  Blood culture (routine x 2)     Status: None (Preliminary result)   Collection Time: 12/15/14  2:32 PM  Result Value Ref Range Status   Specimen Description BLOOD LEFT ARM  Final   Special Requests BOTTLES DRAWN AEROBIC ONLY 5CC  Final   Culture   Final           BLOOD CULTURE RECEIVED NO GROWTH TO DATE CULTURE WILL BE HELD FOR 5 DAYS BEFORE ISSUING A FINAL NEGATIVE REPORT Performed at [facility]    Report Status PENDING  Incomplete  Blood culture (routine x 2)     Status: None (Preliminary result)   Collection Time: 12/15/14  2:38 PM  Result Value Ref Range Status   Specimen Description BLOOD CENTRAL LINE  Final   Special Requests BOTTLES DRAWN AEROBIC AND ANAEROBIC 5CC  Final   Culture   Final           BLOOD CULTURE RECEIVED NO GROWTH TO DATE CULTURE WILL BE HELD FOR 5 DAYS BEFORE ISSUING A FINAL NEGATIVE REPORT Performed at [facility]    Report Status PENDING  Incomplete  Urine culture     Status: None   Collection Time: 12/15/14  3:39 PM  Result Value Ref Range Status   Specimen Description URINE, CATHETERIZED  Final   Special Requests NONE  Final   Colony Count NO GROWTH Performed at [facility]   Final   Culture NO GROWTH Performed at [facility]   Final    Report Status 12/17/2014 FINAL  Final  Clostridium Difficile by PCR     Status: None   Collection Time: 12/16/14  7:06 PM  Result Value Ref Range Status   C difficile by pcr NEGATIVE NEGATIVE Final    RADIOLOGY STUDIES/RESULTS: Ct Abdomen Pelvis W Contrast  12/16/2014   CLINICAL DATA:  Pancreatic cancer, epigastric pain, leukocytosis, lactic acidosis  EXAM: CT ABDOMEN AND PELVIS WITH CONTRAST  TECHNIQUE: Multidetector CT imaging of the abdomen and pelvis was performed using the standard protocol following bolus administration of intravenous contrast.  CONTRAST:  172mL OMNIPAQUE IOHEXOL 300 MG/ML  SOLN  COMPARISON:  09/24/2014  FINDINGS: Lower chest: Small bilateral pleural effusions. Calcified granulomas in the right middle lobe.  Right chest power port terminates at the cavoatrial junction.  Hepatobiliary: Multifocal hepatic metastases throughout the liver. Dominant 6.5 x 4.9 cm lesion in the right hepatic lobe (series 3/image 17), previously 3.6 x 2.4 cm. Dominant 7.2 x 8.3 cm lesion in the left hepatic lobe (series 3/ image 33), previously 4.2 x 3.7 cm.  Gallbladder is unremarkable. No intrahepatic or extrahepatic ductal dilatation.  Pancreas: 8.2 x 9.9 cm mass in the distal pancreatic body/tail, corresponding to known pancreatic cancer, previously 6.2 x 8.7 cm.  Spleen: Calcified splenic granulomata.  Adrenals/Urinary Tract: Left adrenal gland abuts the pancreatic mass (Series 3/ image 26). Right adrenal gland is within normal limits.  Anterior interpolar left kidney abuts the pancreatic mass and is likely involve (series 3/ image 29). Right kidney is within normal limits. No hydronephrosis.  Bladder is mildly thick-walled but underdistended.  Stomach/Bowel: Pancreatic mass abuts but does not definitely involve the posterior gastric cardia, which is notable for associated submucosal edema (Series 2/ image 22).  No evidence of bowel obstruction.  Near concentric sigmoid colonic mass measuring 2.3 x  2.9 cm (series 3/image 71), worrisome for primary colonic neoplasm, less likely metastasis.  Vascular/Lymphatic: No evidence of abdominal aortic aneurysm. Retro aortic left renal vein.  9 mm short axis portacaval node (series 3/ image 32).  Reproductive: Calcified uterine fibroids.  No adnexal masses.  Other: Moderate abdominopelvic ascites.  Body wall edema.  Musculoskeletal: Mild degenerative changes of the visualized thoracolumbar spine.  IMPRESSION: 9.9 cm mass in the distal pancreatic body/tail, corresponding to known pancreatic cancer, increased.  Mass abuts and may involve the anterior left kidney. Mass abuts but does not definitely involve the posterior stomach and left adrenal gland.  Progression of multifocal hepatic metastases, as above.  2.9 cm sigmoid colonic mass, worrisome for primary colonic neoplasm, less likely metastasis.  Moderate abdominal pelvic ascites.  Body wall edema.  Additional ancillary findings as above.   Electronically Signed   By: Julian Hy M.D.   On: 12/16/2014 15:47   Dg Chest Port 1 View  12/15/2014   CLINICAL DATA:  Weakness, lethargy.  EXAM: PORTABLE CHEST - 1 VIEW  COMPARISON:  Chest radiograph 11/05/2014  FINDINGS: Right sided Port-A-Cath is present with tip projecting over the right atrium, unchanged. Stable cardiac and mediastinal contours. Elevation of the right hemidiaphragm. Minimal right basilar atelectasis. Re- demonstrated calcified granulomas. Radiodensity projecting over the central right hemi thorax.  IMPRESSION: Radiodensity projecting over the central right hemi thorax nonspecific, potentially external to the patient. Recommend clinical correlation.  Elevation of the right hemidiaphragm with right basilar atelectasis.   Electronically Signed   By: Lovey Newcomer M.D.   On: 12/15/2014 15:10    Oren Binet, MD  [facility] Pager:[phone]  If 7PM-7AM, please contact night-coverage [email or web address] Password [email or web address] 12/19/2014, 2:08 PM   LOS:  4 days

## 2014-12-19 NOTE — Progress Notes (Signed)
Nutrition Brief Note  Chart reviewed. Pt now transitioning to comfort care.  No further nutrition interventions warranted at this time.  Please re-consult as needed.   Abass Misener A. Bellina Tokarczyk, RD, LDN, CDE Pager: [phone] After hours Pager: [phone]  

## 2014-12-19 NOTE — Progress Notes (Signed)
VASCULAR LAB PRELIMINARY  PRELIMINARY  PRELIMINARY  PRELIMINARY  Bilateral lower extremity venous duplex  completed.    Preliminary report:  Bilateral:  No evidence of DVT, superficial thrombosis, or Baker's Cyst.    Miroslava Santellan, RVT 12/19/2014, 12:29 PM

## 2014-12-19 NOTE — Progress Notes (Signed)
[place] OFFICE PROGRESS NOTE   Diagnosis: Pancreas cancer  INTERVAL HISTORY:   Debra [name] appears unchanged this morning. She denies pain.   Objective:  Vital signs in last 24 hours:  Blood pressure 134/72, pulse 115, temperature 98.3 F (36.8 C), temperature source Oral, resp. rate 28, height 5\' 1"  (1.549 m), weight 96 lb 15.8 oz (43.993 kg), SpO2 99 %.    Physical examination-not performed today    Lab Results:  Lab Results  Component Value Date   WBC 23.1* 12/19/2014   HGB 8.9* 12/19/2014   HCT 26.9* 12/19/2014   MCV 86.5 12/19/2014   PLT 373 12/19/2014   NEUTROABS 20.3* 12/19/2014     Lab Results  Component Value Date   CEA 2405.0* 12/17/2014    Imaging:  Ct Abdomen Pelvis W Contrast  12/16/2014   CLINICAL DATA:  Pancreatic cancer, epigastric pain, leukocytosis, lactic acidosis  EXAM: CT ABDOMEN AND PELVIS WITH CONTRAST  TECHNIQUE: Multidetector CT imaging of the abdomen and pelvis was performed using the standard protocol following bolus administration of intravenous contrast.  CONTRAST:  158mL OMNIPAQUE IOHEXOL 300 MG/ML  SOLN  COMPARISON:  09/24/2014  FINDINGS: Lower chest: Small bilateral pleural effusions. Calcified granulomas in the right middle lobe.  Right chest power port terminates at the cavoatrial junction.  Hepatobiliary: Multifocal hepatic metastases throughout the liver. Dominant 6.5 x 4.9 cm lesion in the right hepatic lobe (series 3/image 17), previously 3.6 x 2.4 cm. Dominant 7.2 x 8.3 cm lesion in the left hepatic lobe (series 3/ image 33), previously 4.2 x 3.7 cm.  Gallbladder is unremarkable. No intrahepatic or extrahepatic ductal dilatation.  Pancreas: 8.2 x 9.9 cm mass in the distal pancreatic body/tail, corresponding to known pancreatic cancer, previously 6.2 x 8.7 cm.  Spleen: Calcified splenic granulomata.  Adrenals/Urinary Tract: Left adrenal gland abuts the pancreatic mass (Series 3/ image 26). Right adrenal gland is  within normal limits.  Anterior interpolar left kidney abuts the pancreatic mass and is likely involve (series 3/ image 29). Right kidney is within normal limits. No hydronephrosis.  Bladder is mildly thick-walled but underdistended.  Stomach/Bowel: Pancreatic mass abuts but does not definitely involve the posterior gastric cardia, which is notable for associated submucosal edema (Series 2/ image 22).  No evidence of bowel obstruction.  Near concentric sigmoid colonic mass measuring 2.3 x 2.9 cm (series 3/image 71), worrisome for primary colonic neoplasm, less likely metastasis.  Vascular/Lymphatic: No evidence of abdominal aortic aneurysm. Retro aortic left renal vein.  9 mm short axis portacaval node (series 3/ image 32).  Reproductive: Calcified uterine fibroids.  No adnexal masses.  Other: Moderate abdominopelvic ascites.  Body wall edema.  Musculoskeletal: Mild degenerative changes of the visualized thoracolumbar spine.  IMPRESSION: 9.9 cm mass in the distal pancreatic body/tail, corresponding to known pancreatic cancer, increased.  Mass abuts and may involve the anterior left kidney. Mass abuts but does not definitely involve the posterior stomach and left adrenal gland.  Progression of multifocal hepatic metastases, as above.  2.9 cm sigmoid colonic mass, worrisome for primary colonic neoplasm, less likely metastasis.  Moderate abdominal pelvic ascites.  Body wall edema.  Additional ancillary findings as above.   Electronically Signed   By: Julian Hy M.D.   On: 12/16/2014 15:47   Dg Chest Port 1 View  12/15/2014   CLINICAL DATA:  Weakness, lethargy.  EXAM: PORTABLE CHEST - 1 VIEW  COMPARISON:  Chest radiograph 11/05/2014  FINDINGS: Right sided Port-A-Cath is present with tip projecting  over the right atrium, unchanged. Stable cardiac and mediastinal contours. Elevation of the right hemidiaphragm. Minimal right basilar atelectasis. Re- demonstrated calcified granulomas. Radiodensity projecting over  the central right hemi thorax.  IMPRESSION: Radiodensity projecting over the central right hemi thorax nonspecific, potentially external to the patient. Recommend clinical correlation.  Elevation of the right hemidiaphragm with right basilar atelectasis.   Electronically Signed   By: Lovey Newcomer M.D.   On: 12/15/2014 15:10    Medications: I have reviewed the patient's current medications.  Assessment/Plan: 1. 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 Pain secondary to #1 3. Nausea secondary to #1 4. Diabetes 5. Anorexia/weight loss 6. Port-A-Cath placement 10/16/2014 7. CT 12/16/2014 with evidence of a sigmoid colon mass 8. Anemia secondary to chronic disease and probable GI bleeding  Disposition:  Debra [name] has metastatic disease involving the liver. A pancreas mass was biopsied with the pathology confirming adenocarcinoma. She appears to have a sigmoid colon mass on CT. She may have pancreas cancer, colon cancer, or both.  I discussed the case with her daughter Debra [name] by telephone yesterday. She is in agreement with a plan for comfort care. I told Debra [name] I would be glad to discuss the case with additional family members if they desire. I reviewed the diagnostic possibilities with Debra [name] this morning. Regardless of a diagnosis of metastatic colon or pancreas cancer she does not appear to be a candidate for further systemic therapy. She has a poor performance status. Debra [name] indicated she does not wish to receive further chemotherapy.  She is a candidate for Hospice care. Options include home hospice versus [facility].   Recommendations: 1. Continue comfort care measures with the plan for [facility] hospice care at discharge 2. I am available to discuss the case with her family as needed. 3. We will schedule outpatient  follow-up.  [name], MD  12/19/2014  9:09 AM

## 2014-12-19 NOTE — Progress Notes (Signed)
[facility])  Home Care Chaplain Note  Patient (pt): Debra [name]  Chaplain visited to assess for spiritual needs.  Pt opened her eyes quickly after chaplain softly called her name.  Pt indicated she was having a "pretty good" day, and that she was grateful that she can rest well.  Pt talked about the blessings of her family and her faith.  Chaplain normalized/validated feelings and affirmed pt's strong and abiding faith.  Chaplain will continue to provide ongoing spiritual support and will follow.  Beverly Isley-Landreth ThM, [place] Clinical Chaplain

## 2014-12-20 DIAGNOSIS — R7989 Other specified abnormal findings of blood chemistry: Secondary | ICD-10-CM

## 2014-12-20 DIAGNOSIS — R778 Other specified abnormalities of plasma proteins: Secondary | ICD-10-CM | POA: Insufficient documentation

## 2014-12-20 MED ORDER — METOPROLOL TARTRATE 25 MG PO TABS: 12.5000 mg | ORAL_TABLET | Freq: Two times a day (BID) | ORAL | Status: AC

## 2014-12-20 MED ORDER — MORPHINE SULFATE (CONCENTRATE) 10 MG/0.5ML PO SOLN: 5.0000 mg | ORAL | Status: AC | PRN

## 2014-12-20 MED ORDER — LORAZEPAM 0.5 MG PO TABS: 0.5000 mg | ORAL_TABLET | Freq: Four times a day (QID) | ORAL | Status: AC | PRN

## 2014-12-20 NOTE — Clinical Social Work Note (Signed)
CSW has spoken with patient's daughter. Daughter states that the family has decided on residential hospice placement for the patient. Family is requesting [facility]. CSW has explained to family that if [facility] is not available, we will need to look at other residential hospice options for discharge today. Patient's daughter states that hospice in [place] or [place] could be considered. CSW has contacted [facility] at number provided in [place] note, CSW was told that a nurse would be paged to make them aware that patient's family would like patient transitioned to [facility] today. CSW will wait to hear from [place] or social worker regarding availability at [facility].    Liz Beach MSW, [place], [place], [phone]

## 2014-12-20 NOTE — Progress Notes (Addendum)
Inpatient RN visit- Anne-Marie Genson [facility] 5W Room [place] [facility] RN Visit-Karen Alford [place] RN Pt seen  at bedside, eyes closed appeared to be sleeping, easily awakened to voice and gentle touch. Patient denied pain. She did request for her legs to be repositioned, she is now to weak to move then independently. Edema noted to lower extremities. Patient repositioned to R side at her request. Staff RN Caryl Comes in with ensure for patient, she declined. Patient back to sleep. Plan is for patient to discharge to [facility] via non emergent transport today. Caryl Comes has called report. Family and [facility] team aware of and in agreement with plan. Related admission to [facility] diagnosis of Pancreatic Ca.   Pt is DNR code.     Patient's home medication list, transfer summary and OOF DNR in place on  shadow chart.  Please call [facility] @ [phone]- with any hospice needs.   Thank you. Tracey Harries, RN, BSN,  [place] Liaison  [phone])

## 2014-12-20 NOTE — Clinical Social Work Note (Signed)
Requested documentation has been faxed to [facility]. Social Worker with [facility] states that the facility has a bed for the patient as long as the patient is appropriate. CSW will wait to hear from [place] regarding admission.    Liz Beach MSW, [place], [place], [phone]

## 2014-12-20 NOTE — Clinical Social Work Psychosocial (Signed)
Clinical Social Work Department BRIEF PSYCHOSOCIAL ASSESSMENT 12/20/2014  Patient:  Debra [name], Debra [name]     Account Number:  [id number]     Admit date:  12/15/2014  Clinical Social Worker:  Lovey Newcomer  Date/Time:  12/20/2014 01:54 PM  Referred by:  Physician  Date Referred:  12/20/2014 Referred for  Residential hospice placement   Other Referral:   NA   Interview type:  Family Other interview type:   Family interviewed to complete assessment.    PSYCHOSOCIAL DATA Living Status:  FAMILY Admitted from facility:   Level of care:   Primary support name:  [name] Primary support relationship to patient:  CHILD, ADULT Degree of support available:   Support is strong.    CURRENT CONCERNS Current Concerns  Post-Acute Placement   Other Concerns:   NA    SOCIAL WORK ASSESSMENT / PLAN CSW spoke with patient's family to complete assessment. Patient and family have decided on full comfort care for patient and would like to admitt to [facility] today. CSW explained role and process of getting patient to facility. Patient appears to be very tired and sleeping. Family was calm and engaged in assessment. CSW will assist.   Assessment/plan status:  Psychosocial Support/Ongoing Assessment of Needs Other assessment/ plan:   [facility] FL2, Fax, PASRR   Information/referral to community resources:   CSW contact information given.    PATIENT'S/FAMILY'S RESPONSE TO PLAN OF CARE: Patient's family plans for patient to DC to [facility].       Liz Beach MSW, [place], [place], [phone]

## 2014-12-20 NOTE — Clinical Social Work Note (Signed)
CSW was notified by [facility] that patient's family deciding between residential hospice or home with hospice. CSW will approach family today to determine their decision.   Liz Beach MSW, [place], [place], [phone]

## 2014-12-20 NOTE — Discharge Summary (Signed)
Physician Discharge Summary  Debra [name] LNL:[id number] DOB: 07/04/1941 DOA: 12/15/2014  PCP: Rodena Medin, MD  Admit date: 12/15/2014 Discharge date: 12/20/2014  Time spent: 35 minutes  Recommendations for Outpatient Follow-up:  1. Comfort care.  Recommend d/c to residential hospice.  Patient with poor prognosis.   Discharge Diagnoses:  Active Problems:   HTN (hypertension)   Leukocytosis   Anemia of chronic disease   Protein-calorie malnutrition, severe   Uncontrolled diabetes mellitus with complications   Pancreatic carcinoma metastatic to liver   Sepsis   Pancreatic cancer metastasized to liver   Sinus tachycardia   Lactic acidosis   Hypercalcemia   SIRS (systemic inflammatory response syndrome)   Discharge Condition: very poor prognosis.  Diet recommendation: comfort feedings.  Filed Weights   12/15/14 1308 12/15/14 1732  Weight: 43.092 kg (95 lb) 43.993 kg (96 lb 15.8 oz)    History of present illness:  Debra [name] is a 74 y.o. female with a PMHx of diabetes type 2, HTN, acute renal failure, metastatic adenocarcinoma the pancreas, pancreas body/tail mass with extensive liver metastases patient is seen by hospice and palliative care [place] [facility]) are in RN [place] [phone].  She presented with a 3 day history of generalized weakness, fatigue and decreased appetite. Patient has pancreatic cancer and is currently on hospice not receiving any treatment.    Hospital Course:   SIR'S:likely infectious etiology-however blood cultures, C. difficile PCR negative. Chest x-ray was negative for pneumonia, UA negative for UTI. Initially she was on on 3 days of empiric vancomycin and aztreonam. This was discontinued. The patient was transitioned to comfort measures and will be discharged today to residential hospice.  Hypercalcemia: Likely secondary to malignancy, dehydration. Given decision to transition to comfort issues, no further lab work will be  ordered.  Metastatic pancreatic adenocarcinoma with new sigmoid colon mass: Dr. Benay Spice of Oncology consulted.  Given significant decline in functional status,she not a candidate for any further therapy. Family agreeable to transition to full comfort status. The patient's daughter, Debra [name], confirmed comfort care plan on 1/13.  Failure to thrive syndrome: Secondary to underlying malignancy. Supportive care.  Ethics/palliative care: DO NOT RESUSCITATE. Family aware that oncology is recommending against any further therapy at this point. [name] is already established with home hospice.  Family members discussed the situation and agreed on a move to residential hospice.   Procedures:  Lower extremity venous Dopplers. Negative for DVT  Consultations:  Medical oncology  Discharge Exam: Filed Vitals:   12/19/14 1331 12/19/14 2142 12/20/14 0534 12/20/14 0957  BP: 148/86 153/83 157/84 154/78  Pulse: 123 127 114 115  Temp: 99.7 F (37.6 C) 98.6 F (37 C) 97.9 F (36.6 C)   TempSrc: Oral Oral Axillary   Resp: 30 16 20    Height:      Weight:      SpO2: 100% 100% 100%    Gen.: Well-developed, cachectic, elderly female, lying comfortably in bed, appears sad Cardiovascular: Tachycardic without frank murmur, rub or gallop.  No JVD Respiratory: Clear to auscultation, no wheezes crackles or rales, no increased work of breathing Abdomen: Thin, nontender, nondistended, decreased bowel sounds, no frank masses Extremities: Bilateral 1-2+ pitting edema in lower extremities.   Discharge Instructions    Current Discharge Medication List    START taking these medications   Details  LORazepam (ATIVAN) 0.5 MG tablet Take 1-2 tablets (0.5-1 mg total) by mouth every 6 (six) hours as needed for anxiety. Qty: 30 tablet, Refills: 0  metoprolol tartrate (LOPRESSOR) 25 MG tablet Take 0.5 tablets (12.5 mg total) by mouth 2 (two) times daily. Qty: 60 tablet, Refills: 0    Morphine Sulfate  (MORPHINE CONCENTRATE) 10 MG/0.5ML SOLN concentrated solution Take 0.25-0.5 mLs (5-10 mg total) by mouth every 2 (two) hours as needed for moderate pain. Qty: 30 mL, Refills: 0      CONTINUE these medications which have NOT CHANGED   Details  acetaminophen (TYLENOL) 325 MG tablet Take 2 tablets (650 mg total) by mouth every 6 (six) hours as needed for mild pain.    ondansetron (ZOFRAN) 4 MG tablet Take 1 tablet (4 mg total) by mouth every 6 (six) hours as needed for nausea. Qty: 20 tablet, Refills: 0      STOP taking these medications     ibuprofen (ADVIL,MOTRIN) 200 MG tablet      insulin detemir (LEVEMIR) 100 UNIT/ML injection      lidocaine-prilocaine (EMLA) cream      lisinopril (PRINIVIL,ZESTRIL) 10 MG tablet      mirtazapine (REMERON) 15 MG tablet      Multiple Vitamin (MULTIVITAMIN WITH MINERALS) TABS tablet      sorbitol 70 % solution      Hydrocodone-Acetaminophen (VICODIN) 5-300 MG TABS      levofloxacin (LEVAQUIN) 500 MG tablet      metoCLOPramide (REGLAN) 5 MG tablet      traMADol (ULTRAM) 50 MG tablet        Allergies  Allergen Reactions  . Amoxicillin Rash      The results of significant diagnostics from this hospitalization (including imaging, microbiology, ancillary and laboratory) are listed below for reference.    Significant Diagnostic Studies: Ct Abdomen Pelvis W Contrast  12/16/2014   CLINICAL DATA:  Pancreatic cancer, epigastric pain, leukocytosis, lactic acidosis  EXAM: CT ABDOMEN AND PELVIS WITH CONTRAST  TECHNIQUE: Multidetector CT imaging of the abdomen and pelvis was performed using the standard protocol following bolus administration of intravenous contrast.  CONTRAST:  159mL OMNIPAQUE IOHEXOL 300 MG/ML  SOLN  COMPARISON:  09/24/2014  FINDINGS: Lower chest: Small bilateral pleural effusions. Calcified granulomas in the right middle lobe.  Right chest power port terminates at the cavoatrial junction.  Hepatobiliary: Multifocal hepatic  metastases throughout the liver. Dominant 6.5 x 4.9 cm lesion in the right hepatic lobe (series 3/image 17), previously 3.6 x 2.4 cm. Dominant 7.2 x 8.3 cm lesion in the left hepatic lobe (series 3/ image 33), previously 4.2 x 3.7 cm.  Gallbladder is unremarkable. No intrahepatic or extrahepatic ductal dilatation.  Pancreas: 8.2 x 9.9 cm mass in the distal pancreatic body/tail, corresponding to known pancreatic cancer, previously 6.2 x 8.7 cm.  Spleen: Calcified splenic granulomata.  Adrenals/Urinary Tract: Left adrenal gland abuts the pancreatic mass (Series 3/ image 26). Right adrenal gland is within normal limits.  Anterior interpolar left kidney abuts the pancreatic mass and is likely involve (series 3/ image 29). Right kidney is within normal limits. No hydronephrosis.  Bladder is mildly thick-walled but underdistended.  Stomach/Bowel: Pancreatic mass abuts but does not definitely involve the posterior gastric cardia, which is notable for associated submucosal edema (Series 2/ image 22).  No evidence of bowel obstruction.  Near concentric sigmoid colonic mass measuring 2.3 x 2.9 cm (series 3/image 71), worrisome for primary colonic neoplasm, less likely metastasis.  Vascular/Lymphatic: No evidence of abdominal aortic aneurysm. Retro aortic left renal vein.  9 mm short axis portacaval node (series 3/ image 32).  Reproductive: Calcified uterine fibroids.  No adnexal masses.  Other: Moderate abdominopelvic ascites.  Body wall edema.  Musculoskeletal: Mild degenerative changes of the visualized thoracolumbar spine.  IMPRESSION: 9.9 cm mass in the distal pancreatic body/tail, corresponding to known pancreatic cancer, increased.  Mass abuts and may involve the anterior left kidney. Mass abuts but does not definitely involve the posterior stomach and left adrenal gland.  Progression of multifocal hepatic metastases, as above.  2.9 cm sigmoid colonic mass, worrisome for primary colonic neoplasm, less likely metastasis.   Moderate abdominal pelvic ascites.  Body wall edema.  Additional ancillary findings as above.   Electronically Signed   By: Julian Hy M.D.   On: 12/16/2014 15:47   Dg Chest Port 1 View  12/15/2014   CLINICAL DATA:  Weakness, lethargy.  EXAM: PORTABLE CHEST - 1 VIEW  COMPARISON:  Chest radiograph 11/05/2014  FINDINGS: Right sided Port-A-Cath is present with tip projecting over the right atrium, unchanged. Stable cardiac and mediastinal contours. Elevation of the right hemidiaphragm. Minimal right basilar atelectasis. Re- demonstrated calcified granulomas. Radiodensity projecting over the central right hemi thorax.  IMPRESSION: Radiodensity projecting over the central right hemi thorax nonspecific, potentially external to the patient. Recommend clinical correlation.  Elevation of the right hemidiaphragm with right basilar atelectasis.   Electronically Signed   By: Lovey Newcomer M.D.   On: 12/15/2014 15:10    Microbiology: Recent Results (from the past 240 hour(s))  Blood culture (routine x 2)     Status: None (Preliminary result)   Collection Time: 12/15/14  2:32 PM  Result Value Ref Range Status   Specimen Description BLOOD LEFT ARM  Final   Special Requests BOTTLES DRAWN AEROBIC ONLY 5CC  Final   Culture   Final           BLOOD CULTURE RECEIVED NO GROWTH TO DATE CULTURE WILL BE HELD FOR 5 DAYS BEFORE ISSUING A FINAL NEGATIVE REPORT Performed at [facility]    Report Status PENDING  Incomplete  Blood culture (routine x 2)     Status: None (Preliminary result)   Collection Time: 12/15/14  2:38 PM  Result Value Ref Range Status   Specimen Description BLOOD CENTRAL LINE  Final   Special Requests BOTTLES DRAWN AEROBIC AND ANAEROBIC 5CC  Final   Culture   Final           BLOOD CULTURE RECEIVED NO GROWTH TO DATE CULTURE WILL BE HELD FOR 5 DAYS BEFORE ISSUING A FINAL NEGATIVE REPORT Performed at [facility]    Report Status PENDING  Incomplete  Urine culture     Status:  None   Collection Time: 12/15/14  3:39 PM  Result Value Ref Range Status   Specimen Description URINE, CATHETERIZED  Final   Special Requests NONE  Final   Colony Count NO GROWTH Performed at [facility]   Final   Culture NO GROWTH Performed at [facility]   Final   Report Status 12/17/2014 FINAL  Final  Clostridium Difficile by PCR     Status: None   Collection Time: 12/16/14  7:06 PM  Result Value Ref Range Status   C difficile by pcr NEGATIVE NEGATIVE Final     Labs: Basic Metabolic Panel:  Recent Labs Lab 12/15/14 1339 12/16/14 0630 12/17/14 0445 12/18/14 0505 12/19/14 0550  NA 134* 138 134* 136 133*  K 3.7 3.7 3.1* 3.2* 3.8  CL 99 108 108 109 109  CO2 25 22 19  17* 17*  GLUCOSE 144* 114* 100* 100* 89  BUN  22 21 19 17 20   CREATININE 0.54 0.56 0.53 0.46* 0.60  CALCIUM 9.9 9.1 8.7 9.0 8.8  MG  --  1.5 1.5 1.5 1.5   Liver Function Tests:  Recent Labs Lab 12/15/14 1339 12/16/14 0630 12/17/14 0445 12/18/14 0505 12/19/14 0550  AST 20 17 18 19 17   ALT 10 8 8 9 9   ALKPHOS 233* 202* 215* 208* 201*  BILITOT 0.5 0.6 0.5 0.8 0.8  PROT 6.6 5.8* 5.5* 5.9* 5.6*  ALBUMIN 1.8* 1.6* 1.5* 1.5* 1.4*   CBC:  Recent Labs Lab 12/15/14 1339 12/16/14 0630 12/17/14 0445 12/18/14 0505 12/19/14 0550  WBC 19.5* 17.3* 20.7* 23.7* 23.1*  NEUTROABS 16.4* 14.5* 17.6* 20.7* 20.3*  HGB 9.2* 8.4* 8.4* 8.6* 8.9*  HCT 28.3* 26.2* 26.1* 26.2* 26.9*  MCV 86.5 87.3 88.2 86.8 86.5  PLT 373 334 334 334 373   Cardiac Enzymes:  Recent Labs Lab 12/15/14 1339  TROPONINI 0.23*   CBG:  Recent Labs Lab 12/16/14 2031 12/17/14 0038 12/17/14 0437 12/17/14 0817 12/17/14 1612  GLUCAP 92 110* 114* 95 143*       Signed:  Melton Alar, PA-C  [facility] 12/20/2014, 10:46 AM

## 2014-12-20 NOTE — Progress Notes (Signed)
Debra [name] to be D/C'd Debra [name] per MD order.  Discussed with nurse at [facility] and all questions fully answered.    Medication List    STOP taking these medications        Hydrocodone-Acetaminophen 5-300 MG Tabs  Commonly known as:  VICODIN     ibuprofen 200 MG tablet  Commonly known as:  ADVIL,MOTRIN     insulin detemir 100 UNIT/ML injection  Commonly known as:  LEVEMIR     levofloxacin 500 MG tablet  Commonly known as:  LEVAQUIN     lidocaine-prilocaine cream  Commonly known as:  EMLA     lisinopril 10 MG tablet  Commonly known as:  PRINIVIL,ZESTRIL     metoCLOPramide 5 MG tablet  Commonly known as:  REGLAN     mirtazapine 15 MG tablet  Commonly known as:  REMERON     multivitamin with minerals Tabs tablet     sorbitol 70 % solution     traMADol 50 MG tablet  Commonly known as:  ULTRAM      TAKE these medications        acetaminophen 325 MG tablet  Commonly known as:  TYLENOL  Take 2 tablets (650 mg total) by mouth every 6 (six) hours as needed for mild pain.     LORazepam 0.5 MG tablet  Commonly known as:  ATIVAN  Take 1-2 tablets (0.5-1 mg total) by mouth every 6 (six) hours as needed for anxiety.     metoprolol tartrate 25 MG tablet  Commonly known as:  LOPRESSOR  Take 0.5 tablets (12.5 mg total) by mouth 2 (two) times daily.     morphine CONCENTRATE 10 MG/0.5ML Soln concentrated solution  Take 0.25-0.5 mLs (5-10 mg total) by mouth every 2 (two) hours as needed for moderate pain.     ondansetron 4 MG tablet  Commonly known as:  ZOFRAN  Take 1 tablet (4 mg total) by mouth every 6 (six) hours as needed for nausea.        VVS, Skin clean, dry and intact without evidence of skin break down, no evidence of skin tears noted.  An After Visit Summary was printed and placed in packet for [facility].    Patient escorted via stretcher, and D/C to [facility] via [place].  Debra [name] D 12/20/2014 12:21 PM

## 2014-12-20 NOTE — Clinical Social Work Note (Signed)
Per MD patient ready to [facility]. RN, patient/family, and facility notified of patient's DC. RN given number for report. DC packet on patient's chart. Ambulance transport requested for patient for 1:30PM. Daughter [name] is aware that transport has been scheduled. CSW signing off at this time.    Liz Beach MSW, [place], [place], [phone]

## 2014-12-21 LAB — CULTURE, BLOOD (ROUTINE X 2)
CULTURE: NO GROWTH
Culture: NO GROWTH

## 2014-12-26 ENCOUNTER — Telehealth: Payer: Self-pay | Admitting: *Deleted

## 2014-12-26 NOTE — Telephone Encounter (Signed)
Pt's daughter called stating concern that mother is not eating or drinking.  "she will die from malnutrition before the cancer kills her "  " could the doctor order nutrition by vein ?"  Pt is currently at [facility]- daughter has spoken with nurse this am " she said Dr Benay Spice would have to advise regarding my concern "  Per call this RN discussed current status of patient and possible complications related to use of nutrition by IV that would not benefit her mother.  This RN discussed aspects and symptoms pt is having as normal related to known declining status.  Per discussion with Debra [name] she was able to verify concern as  " I feel like she will die of starvation before the cancer will take her life ".  Debra [name] would like for Dr Benay Spice to be inform her what is best of care in this situation.  Debra [name] return call [phone]  THIS NOTE WILL BE SENT TO MD AND RN AT DESK FOR FOLLOW UP

## 2014-12-26 NOTE — Telephone Encounter (Signed)
Dr. Benay Spice discussed with Dewitt Rota 12/13/2014 via phone.

## 2014-12-27 ENCOUNTER — Encounter: Payer: Self-pay | Admitting: *Deleted

## 2014-12-27 NOTE — Progress Notes (Signed)
Faxed notification from [place] [facility] that patient died Jan 19, 2015 at 9:50 pm at [facility]. MD notified.

## 2015-01-01 IMAGING — CT CT CHEST W/ CM
1 of 2 series · 14 of 31 positions shown, 18 images · IV contrast (OMNIPAQUE)
Comparison: Chest radiograph 12/08/2013, CT abdomen and pelvis
09/24/2014

CLINICAL DATA: New diagnosis of pancreatic mass, epigastric
fullness, 50 pound weight loss, personal history of type 2 diabetes
mellitus, DKA, hypertension, acute renal failure

EXAM:
CT CHEST WITH CONTRAST
TECHNIQUE: Multidetector CT imaging of the chest was performed during
intravenous contrast administration. Sagittal and coronal MPR images
reconstructed from axial data set.
CONTRAST:  80mL OMNIPAQUE IOHEXOL 300 MG/ML  SOLN

[Series 2: rtn chest with st · axial · 0.65mm/px · z∈[-239,-14]mm · 14 of 55 slices shown, 18 images]
[im 5/55  mediastinal]
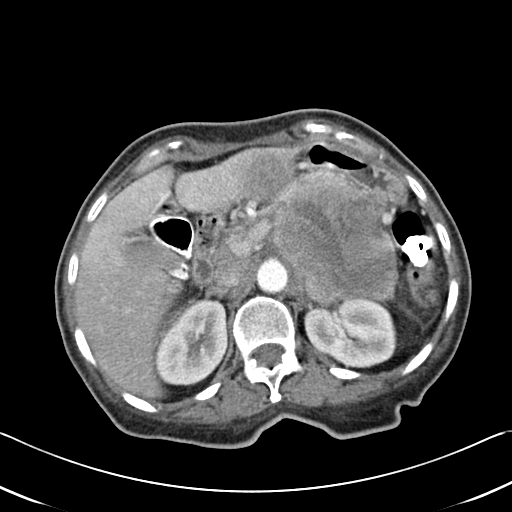
[im 5/55  lung]
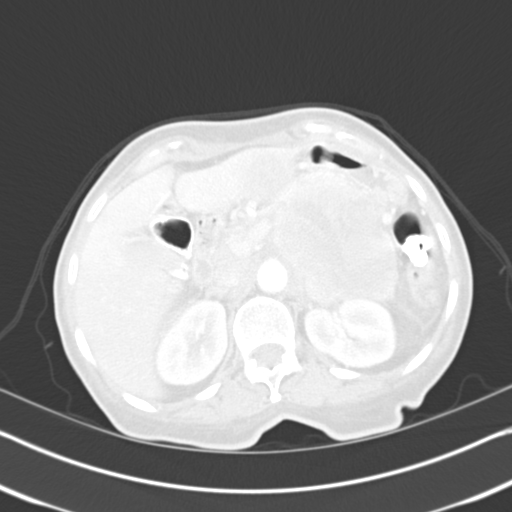
[im 9/55  lung]
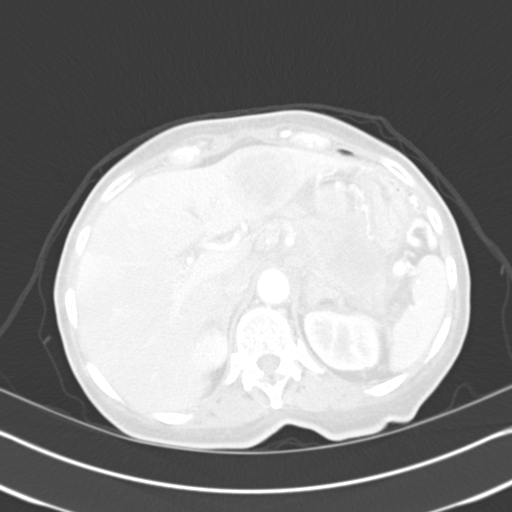
[im 13/55  lung]
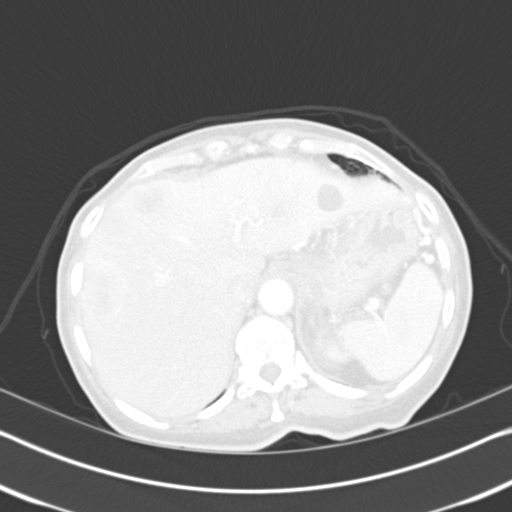
[im 17/55  lung]
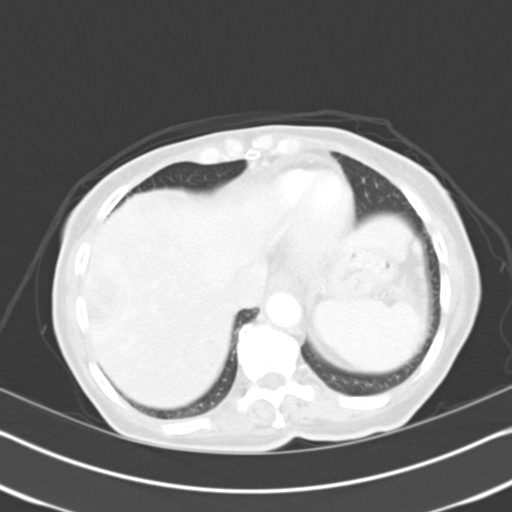
[im 21/55  mediastinal]
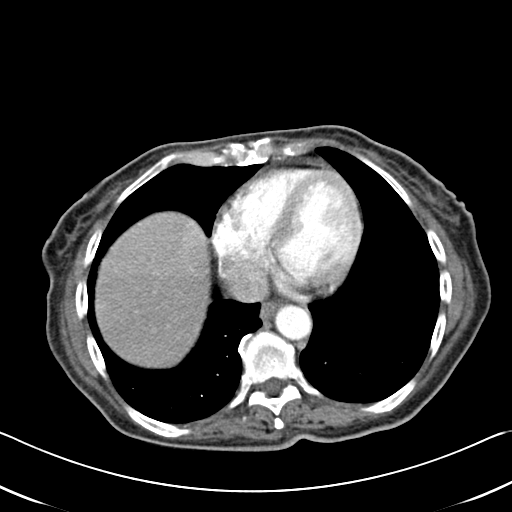
[im 21/55  lung]
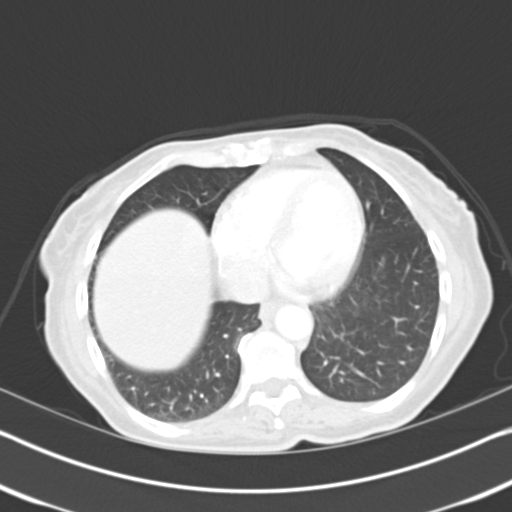
[im 25/55  lung]
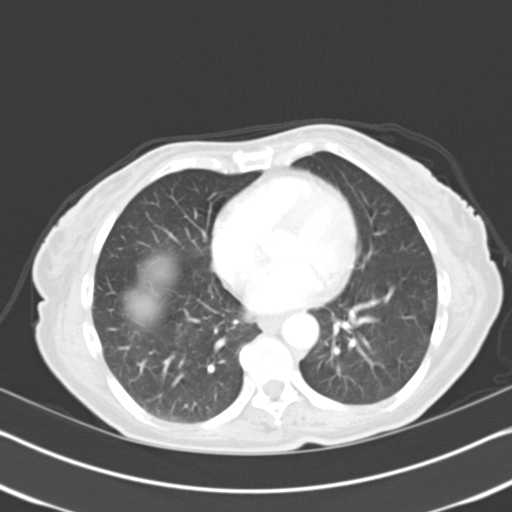
[im 26/55  lung]
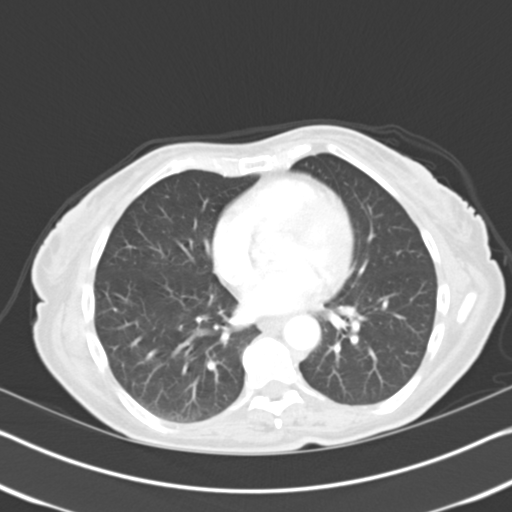
[im 28/55  lung]
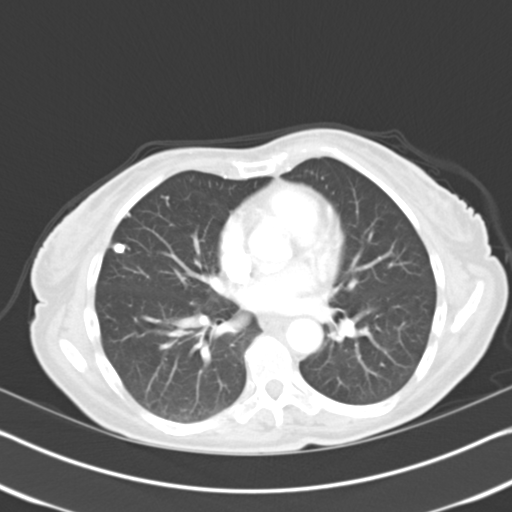
[im 30/55  mediastinal]
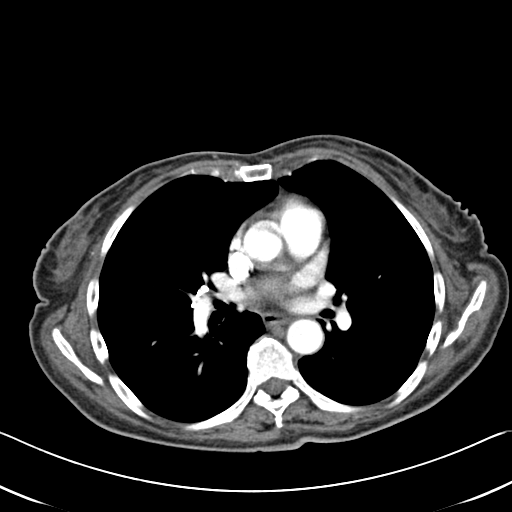
[im 30/55  lung]
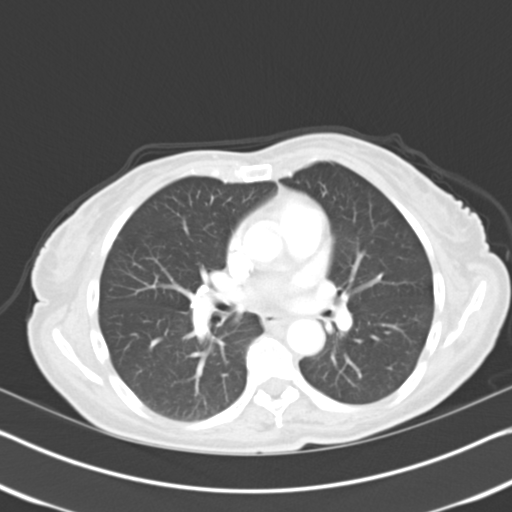
[im 34/55  lung]
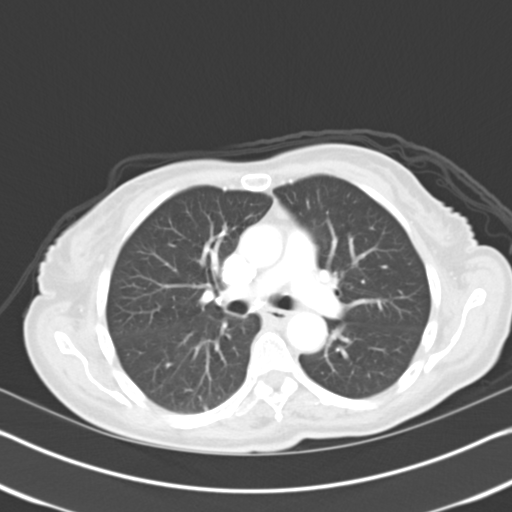
[im 38/55  lung]
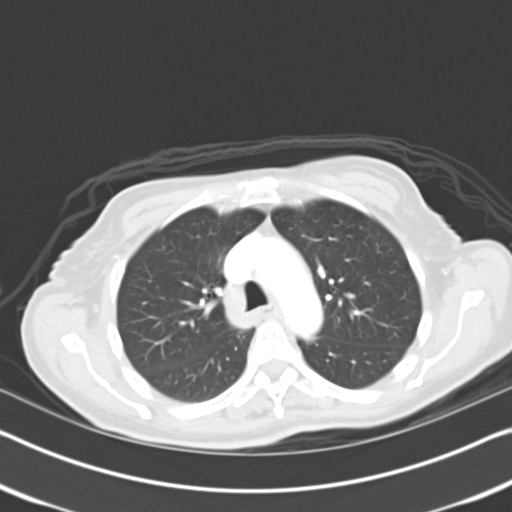
[im 42/55  lung]
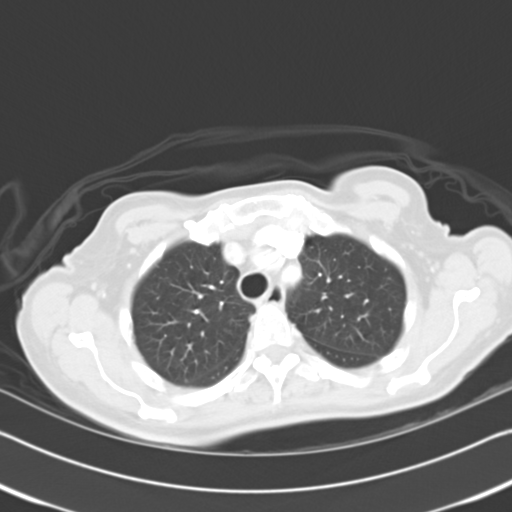
[im 46/55  mediastinal]
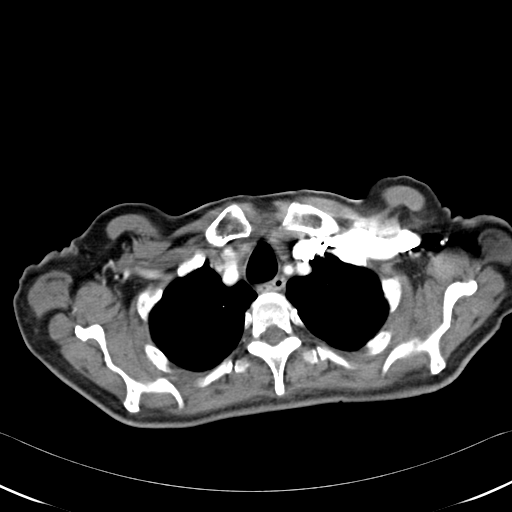
[im 46/55  lung]
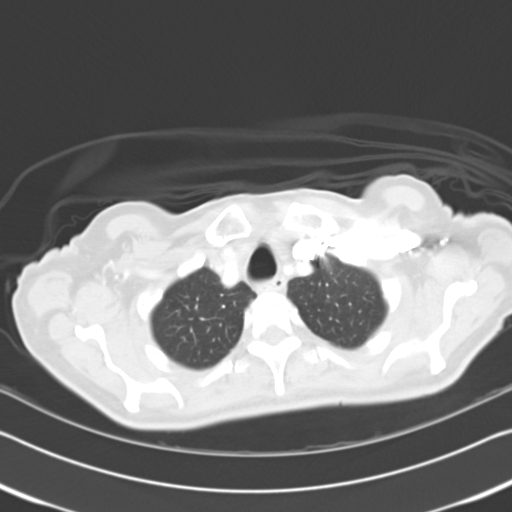
[im 50/55  lung]
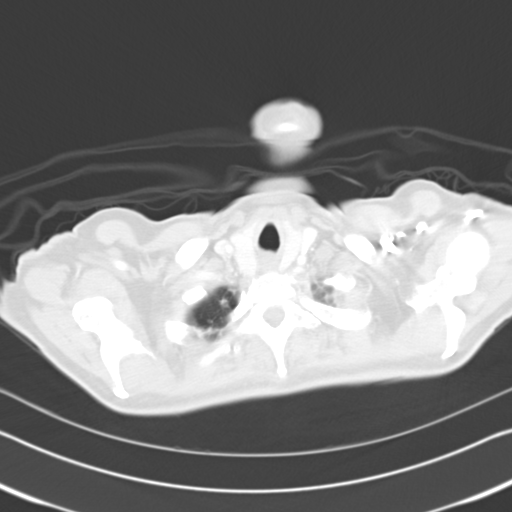

[14 of 31 positions shown; findings below may reference images not displayed]

FINDINGS: Thoracic vascular structures patent on nondedicated exam.

Minimal pericardial effusion.

Calcified RIGHT hilar lymph nodes with calcified granulomata in
RIGHT middle lobe and spleen.

Large pancreatic tail mass 9.9 x 6.5 cm image 51.

Multiple hepatic metastases up to 4.6 x 4.2 cm image 48.

Abdominal findings are better visualized on the recent CT abdomen.

Lungs otherwise clear.

No infiltrate, pleural effusion, pneumothorax or additional mass/
nodule.

Thrombosed splenic vein with LEFT upper quadrant collaterals.

No osseous metastases.
IMPRESSION: Old granulomatous disease.

No acute intra thoracic abnormalities.

Large pancreatic tail mass 9.9 x 6.5 cm with multiple hepatic
metastases compatible with metastatic pancreatic neoplasm.

## 2015-01-07 DEATH — deceased

## 2015-02-09 IMAGING — CR DG CHEST 2V
2 series · 2 of 2 positions shown · non-contrast
Comparison: 12/08/2013 chest radiographs

CLINICAL DATA: Fall with dizziness and left-sided pain. Initial
encounter. Metastatic pancreatic cancer.

EXAM:
CHEST  2 VIEW

[chest lat]
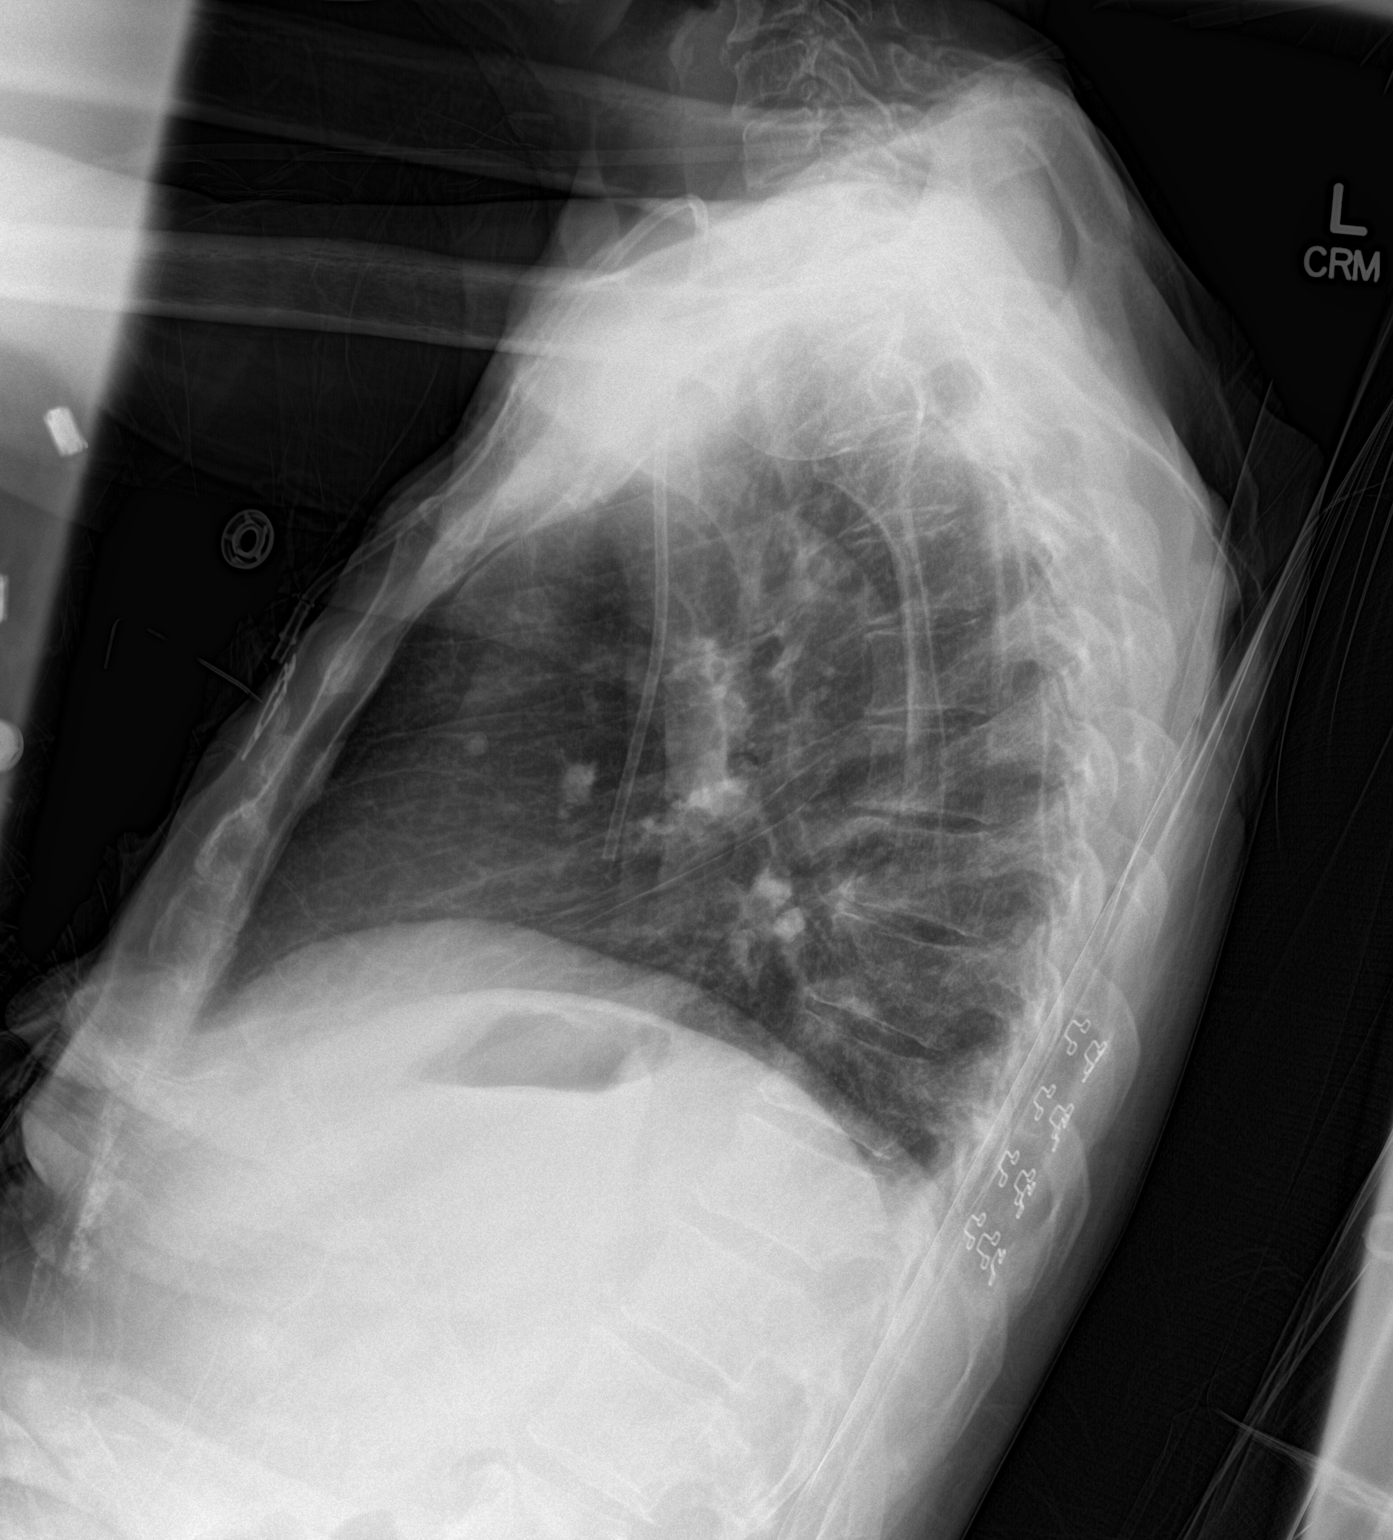

[chest ap]
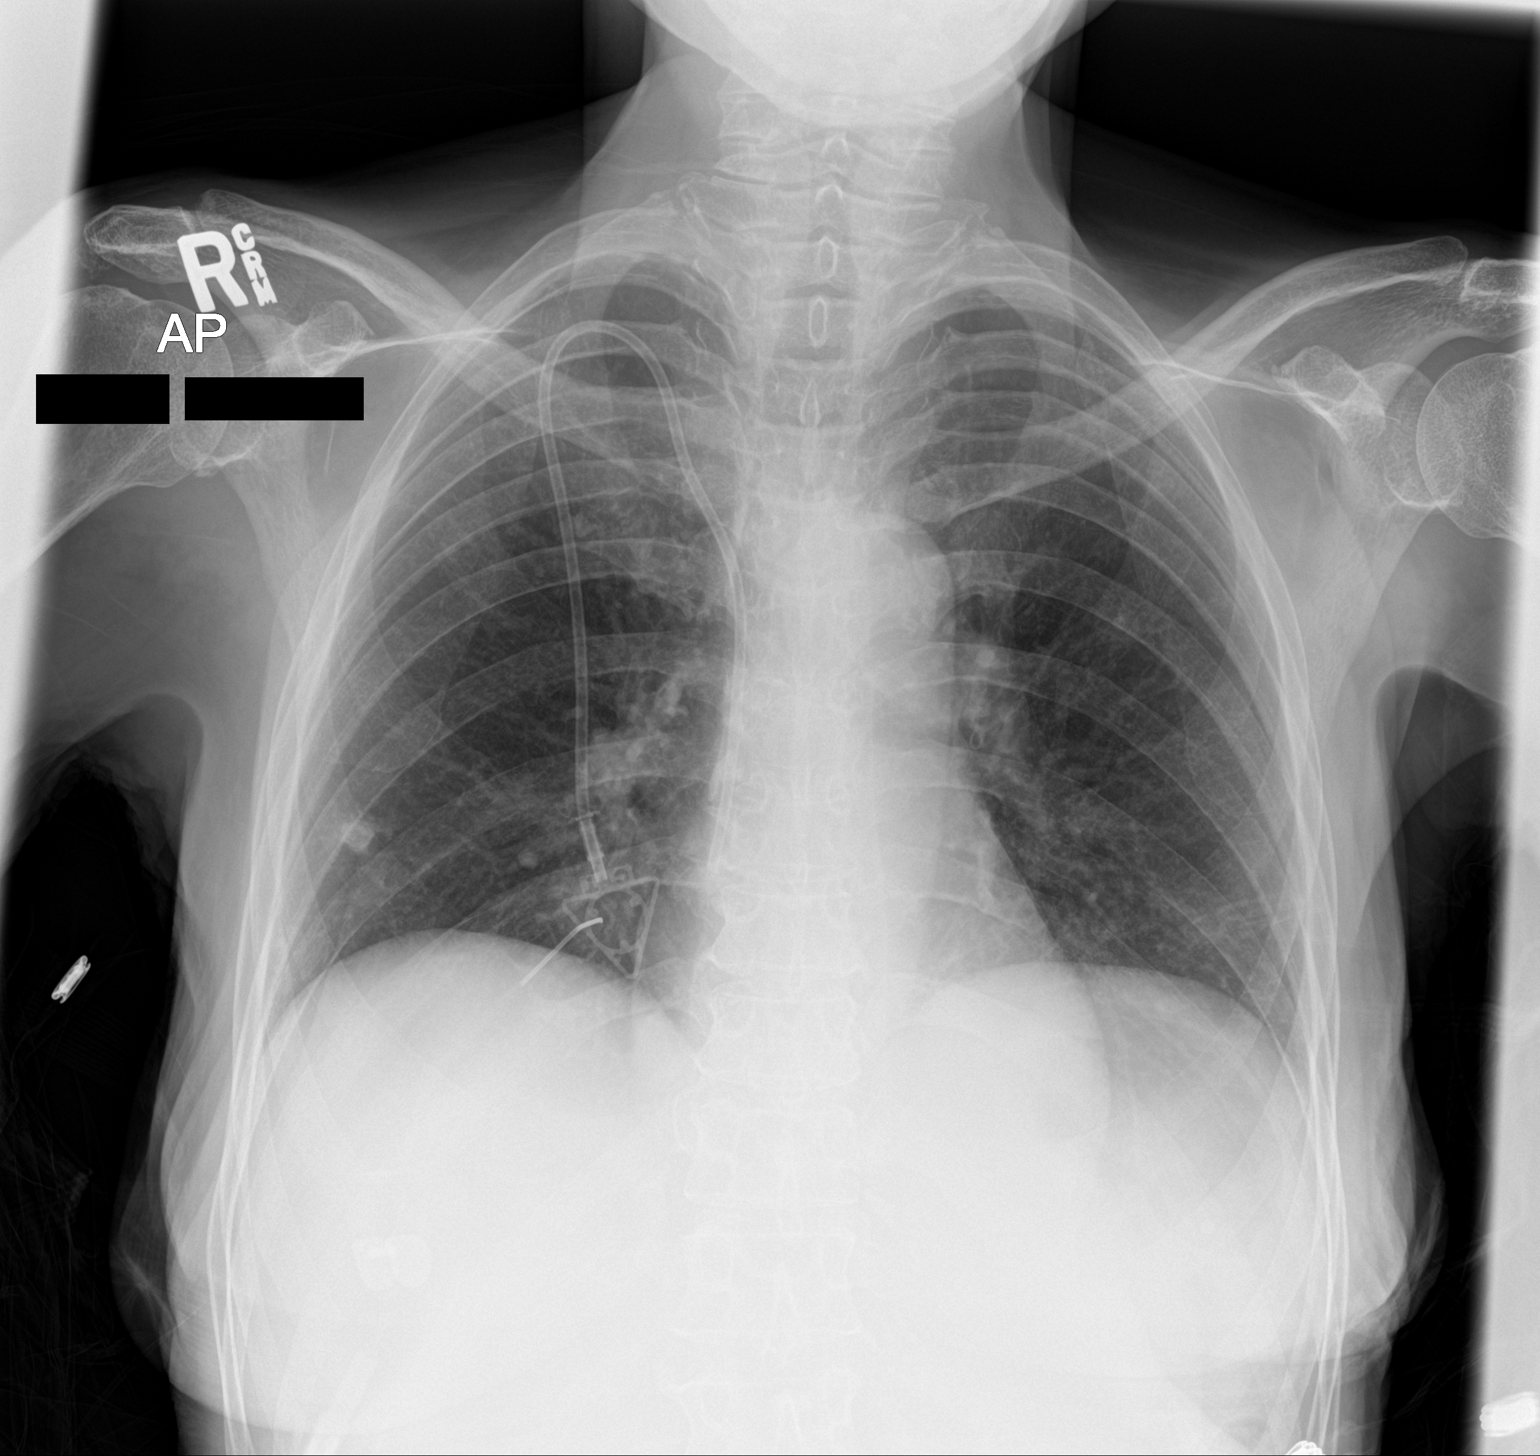

[2 of 2 positions shown; findings below may reference images not displayed]

FINDINGS: The cardiomediastinal silhouette is unremarkable.

A right central venous catheter is identified with tip overlying the
superior cavoatrial junction.

Calcified granulomas in the right lower lung again noted.

There is no evidence of focal airspace disease, pulmonary edema,
suspicious pulmonary nodule/mass, pleural effusion, or pneumothorax.
No acute bony abnormalities are identified.
IMPRESSION: No active cardiopulmonary disease.

## 2015-03-21 IMAGING — CR DG CHEST 1V PORT
1 series · 1 of 1 positions shown · non-contrast
Comparison: Chest radiograph 11/05/2014

CLINICAL DATA: Weakness, lethargy.

EXAM:
PORTABLE CHEST - 1 VIEW

[AP]
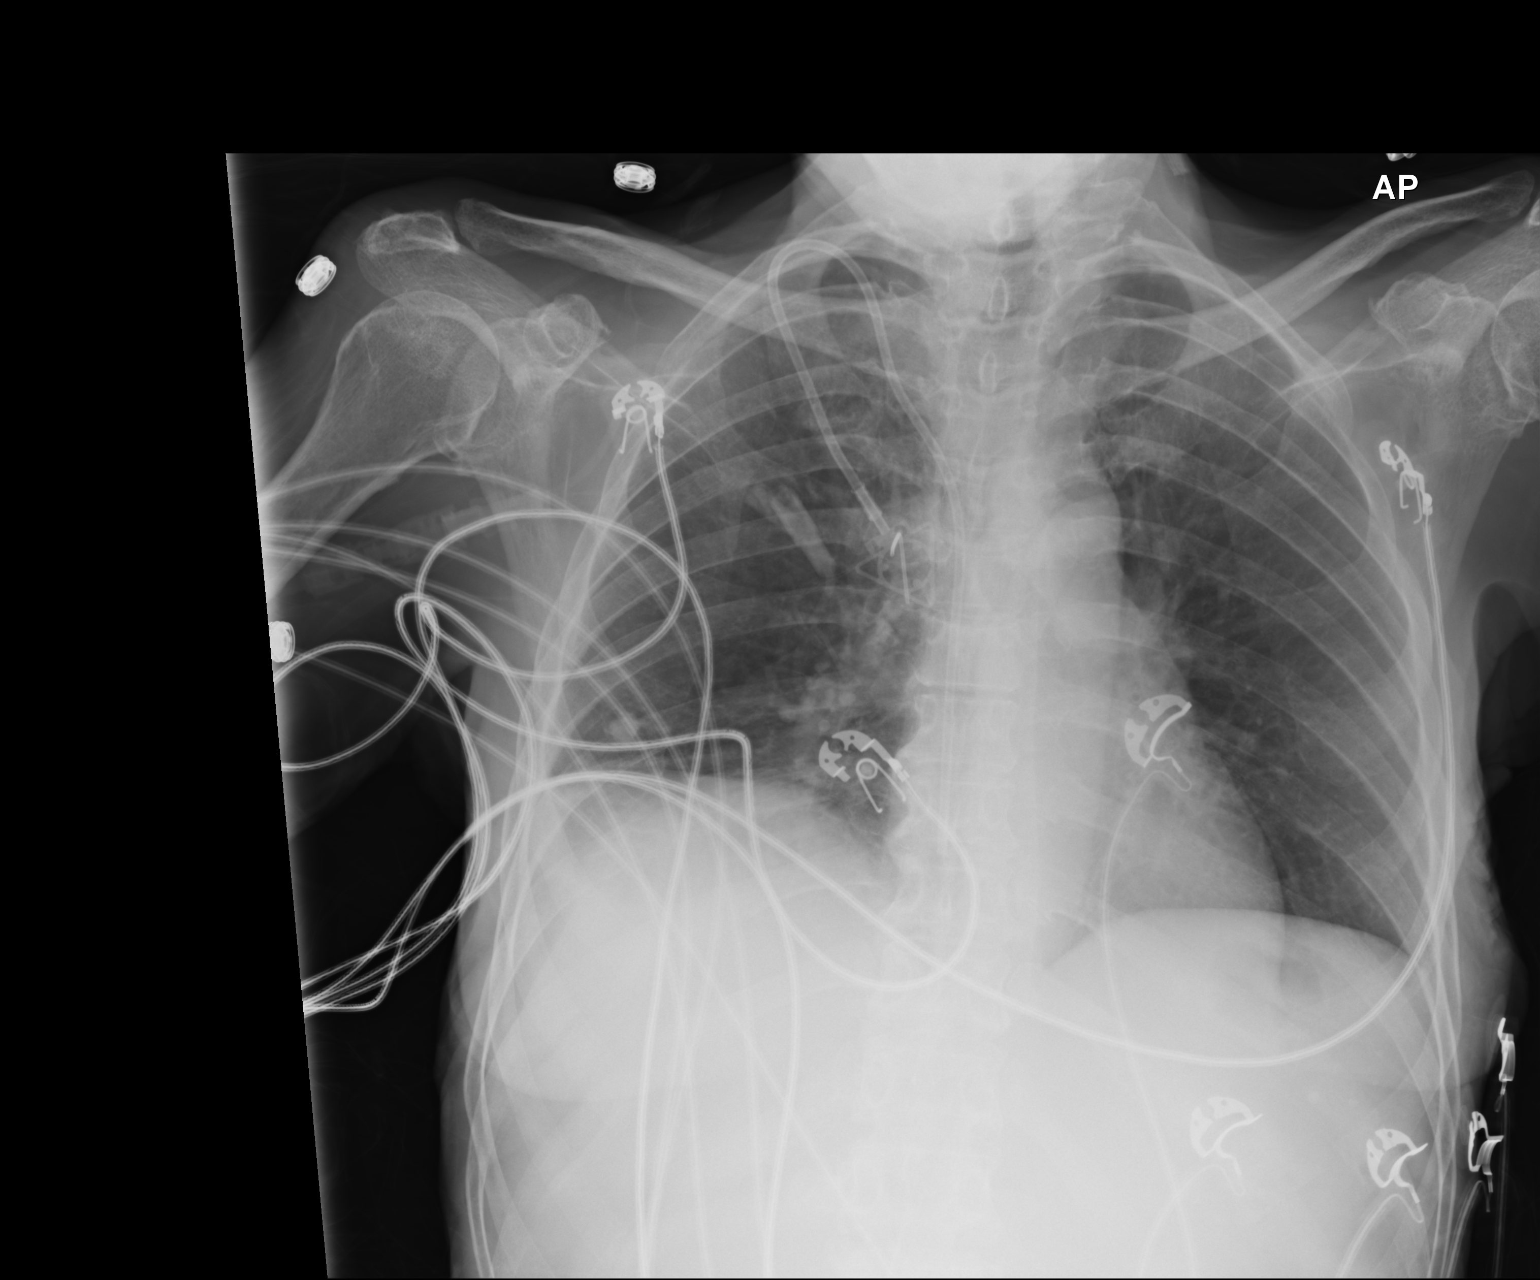

[1 of 1 positions shown; findings below may reference images not displayed]

FINDINGS: Right sided Port-A-Cath is present with tip projecting over the
right atrium, unchanged. Stable cardiac and mediastinal contours.
Elevation of the right hemidiaphragm. Minimal right basilar
atelectasis. Re- demonstrated calcified granulomas. Radiodensity
projecting over the central right hemi thorax.
IMPRESSION: Radiodensity projecting over the central right hemi thorax
nonspecific, potentially external to the patient. Recommend clinical
correlation.

Elevation of the right hemidiaphragm with right basilar atelectasis.

## 2016-12-23 ENCOUNTER — Other Ambulatory Visit: Payer: Self-pay | Admitting: Nurse Practitioner
